# Patient Record
Sex: Male | Born: 1950 | Race: White | Hispanic: No | Marital: Married | Smoking: Never smoker
Health system: Southern US, Community
[De-identification: ages and names within clinical notes are randomized; demographics above are authoritative.]

## PROBLEM LIST (undated history)

## (undated) ENCOUNTER — Inpatient Hospital Stay (HOSPITAL_COMMUNITY): Payer: Commercial Managed Care - PPO

## (undated) DIAGNOSIS — R972 Elevated prostate specific antigen [PSA]: Secondary | ICD-10-CM

## (undated) DIAGNOSIS — E785 Hyperlipidemia, unspecified: Secondary | ICD-10-CM

## (undated) DIAGNOSIS — G4730 Sleep apnea, unspecified: Secondary | ICD-10-CM

## (undated) DIAGNOSIS — I517 Cardiomegaly: Secondary | ICD-10-CM

## (undated) HISTORY — DX: Cardiomegaly: I51.7

## (undated) HISTORY — DX: Hyperlipidemia, unspecified: E78.5

## (undated) HISTORY — DX: Sleep apnea, unspecified: G47.30

## (undated) HISTORY — DX: Elevated prostate specific antigen (PSA): R97.20

## (undated) HISTORY — PX: MENISCUS REPAIR: SHX5179

---

## 1999-03-13 ENCOUNTER — Encounter: Payer: Self-pay | Admitting: Gastroenterology

## 1999-03-13 ENCOUNTER — Encounter: Admission: RE | Admit: 1999-03-13 | Discharge: 1999-03-13 | Payer: Self-pay | Admitting: Gastroenterology

## 1999-04-07 ENCOUNTER — Encounter: Admission: RE | Admit: 1999-04-07 | Discharge: 1999-04-07 | Payer: Self-pay | Admitting: Gastroenterology

## 1999-04-07 ENCOUNTER — Encounter: Payer: Self-pay | Admitting: Gastroenterology

## 2002-08-01 ENCOUNTER — Ambulatory Visit (HOSPITAL_BASED_OUTPATIENT_CLINIC_OR_DEPARTMENT_OTHER): Admission: RE | Admit: 2002-08-01 | Discharge: 2002-08-01 | Payer: Self-pay | Admitting: Orthopedic Surgery

## 2003-03-22 ENCOUNTER — Ambulatory Visit (HOSPITAL_COMMUNITY): Admission: RE | Admit: 2003-03-22 | Discharge: 2003-03-22 | Payer: Self-pay | Admitting: Gastroenterology

## 2005-12-15 ENCOUNTER — Ambulatory Visit: Payer: Self-pay

## 2012-11-30 ENCOUNTER — Ambulatory Visit (INDEPENDENT_AMBULATORY_CARE_PROVIDER_SITE_OTHER): Payer: PRIVATE HEALTH INSURANCE | Admitting: Cardiovascular Disease

## 2012-11-30 ENCOUNTER — Encounter: Payer: Self-pay | Admitting: Cardiovascular Disease

## 2012-11-30 VITALS — BP 120/86 | HR 66 | Ht 73.0 in | Wt 194.0 lb

## 2012-11-30 DIAGNOSIS — R002 Palpitations: Secondary | ICD-10-CM

## 2012-11-30 HISTORY — DX: Palpitations: R00.2

## 2012-11-30 NOTE — Patient Instructions (Addendum)
Your physician has requested that you have an exercise tolerance test. For further information please visit [email or web address]. Please also follow instruction sheet, as given.  Your physician recommends that you schedule a follow-up appointment as needed.   

## 2012-11-30 NOTE — Progress Notes (Signed)
HPI:  This is a healthy 62 year old gentleman presenting for evaluation of heart palpitations. He has experienced palpitations on and off for quite some time. However, a few weeks ago he had worsening symptoms. These occurred at rest. His pulse felt irregular with premature beats noted. He also felt "skipped beats" in his chest. He had no associated chest pain or pressure, dyspnea, or lightheadedness. He is physically active. The patient exercises every day. He runs, rides bikes, plays tennis, etc. He has no symptoms with exertion. He's never been hospitalized. He has no past history of any surgeries. He takes no medications except for fish oil and multivitamin. He feels well. He's concerned about heart disease because of his family history. He reports no changes in sleep pattern, caffeine intake, or alcohol intake. He drinks a few glasses of wine at night. He does have a lot of stress.  Outpatient Encounter Prescriptions as of 11/30/2012  Medication Sig Dispense Refill  . Multiple Vitamins-Minerals (MULTIVITAMIN WITH MINERALS) tablet Take 1 tablet by mouth daily.      . Omega-3 Fatty Acids (OMEGA-3 FISH OIL PO) Take by mouth as needed.      . [DISCONTINUED] fish oil-omega-3 fatty acids 1000 MG capsule Take 2 g by mouth daily.       No facility-administered encounter medications on file as of 11/30/2012.    Review of patient's allergies indicates no known allergies.  Past Medical History  Diagnosis Date  . Increased prostate specific antigen (PSA) velocity   . Hyperlipidemia   . Sleep apnea     History reviewed. No pertinent past surgical history.  History   Social History  . Marital Status: Married    Spouse Name: N/A    Number of Children: N/A  . Years of Education: N/A   Occupational History  . Not on file.   Social History Main Topics  . Smoking status: Never Smoker   . Smokeless tobacco: Not on file  . Alcohol Use: Yes  . Drug Use: No  . Sexually Active: Not on file    Other Topics Concern  . Not on file   Social History Narrative   Married with 2 sons, nonsmoker, social ETOH, works as Psychologist, educational in Geologist, engineering. Frequent exercise.   Family history: negative for premature CAD. Mother died suddenly at 12, father died of cancer.  ROS: General: no fevers/chills/night sweats Eyes: no blurry vision, diplopia, or amaurosis ENT: no sore throat or hearing loss Resp: no cough, wheezing, or hemoptysis CV: no edema or palpitations GI: no abdominal pain, nausea, vomiting, diarrhea, or constipation GU: no dysuria, frequency, or hematuria Skin: no rash Neuro: no headache, numbness, tingling, or weakness of extremities Musculoskeletal: no joint pain or swelling Heme: no bleeding, DVT, or easy bruising Endo: no polydipsia or polyuria  BP 120/86  Pulse 66  Ht 6\' 1"  (1.854 m)  Wt 87.998 kg (194 lb)  BMI 25.6 kg/m2  PHYSICAL EXAM: Pt is alert and oriented, WD, WN, in no distress. HEENT: normal Neck: JVP normal. Carotid upstrokes normal without bruits. No thyromegaly. Lungs: equal expansion, clear bilaterally CV: Apex is discrete and nondisplaced, bradycardic and regular without murmur or gallop Abd: soft, NT, +BS, no bruit, no hepatosplenomegaly Back: no CVA tenderness Ext: no C/C/E        DP/PT pulses intact and = Skin: warm and dry without rash Neuro: CNII-XII intact             Strength intact = bilaterally  EKG:  11/28/2012: sinus  brady 46 bpm, otherwise WNL  ASSESSMENT AND PLAN: Heart palpitations. Absence of other associated symptoms noted. I don't see any 'red flags' on EKG or based on symptoms. No evidence of structural heart disease with normal exam. Discussed role of good sleep, caffeine and alcohol avoidance, and stress as these things can all affect palpitations. Would not recommend prn or scheduled medication at this time. He will call if symptoms worsen.  Pt most concerned about CAD risk based on family hx in aunts/uncles. Recommend  exercise treadmill study. Follow-up as needed.  Merek Niu 12/02/2012 6:39 AM

## 2012-12-02 ENCOUNTER — Encounter: Payer: Self-pay | Admitting: Cardiovascular Disease

## 2012-12-21 ENCOUNTER — Encounter: Payer: PRIVATE HEALTH INSURANCE | Admitting: Nurse Practitioner

## 2013-01-12 ENCOUNTER — Ambulatory Visit (INDEPENDENT_AMBULATORY_CARE_PROVIDER_SITE_OTHER): Payer: PRIVATE HEALTH INSURANCE | Admitting: Physician Assistant

## 2013-01-12 DIAGNOSIS — R002 Palpitations: Secondary | ICD-10-CM

## 2013-01-12 DIAGNOSIS — Z8249 Family history of ischemic heart disease and other diseases of the circulatory system: Secondary | ICD-10-CM

## 2013-01-12 DIAGNOSIS — R001 Bradycardia, unspecified: Secondary | ICD-10-CM

## 2013-01-12 DIAGNOSIS — I472 Ventricular tachycardia: Secondary | ICD-10-CM

## 2013-01-12 NOTE — Progress Notes (Signed)
Exercise Treadmill Test  Pre-Exercise Testing Evaluation Rhythm: sinus bradycardia  Rate: 57     Test  Exercise Tolerance Test Ordering MD: Tonny Bollman, MD  Interpreting MD: Tereso Newcomer, PA-C  Unique Test No: 1  Treadmill:  1  Indication for ETT: Palpitations, Family history of cardiovascular disease, Bradycardia  Contraindication to ETT: No   Stress Modality: exercise - treadmill  Cardiac Imaging Performed: non   Protocol: standard Bruce - maximal  Max BP:  206/98  Max MPHR (bpm):  160 85% MPR (bpm):  136  MPHR obtained (bpm):  157 % MPHR obtained:  98  Reached 85% MPHR (min:sec):  7:28 Total Exercise Time (min-sec):  11:42  Workload in METS:  13.4 Borg Scale: 15  Reason ETT Terminated:  desired heart rate attained    ST Segment Analysis At Rest: normal ST segments - no evidence of significant ST depression With Exercise: ST depression that resolves quickly in recovery  Other Information Arrhythmia:  Yes Angina during ETT:  absent (0) Quality of ETT:  indeterminate  ETT Interpretation:  borderline (indeterminate) with non-specific ST changes  Comments: Good exercise tolerance. No chest pain. Normal BP response to exercise. Occasional PVC/PACs; one Ventricular Triplet There was 1-2 mm ST depression in leads 2 and 3 at maximal stress.  This resolved 30 seconds into recovery.  Specificity of this finding is low. With good exercise tolerance and no chest pain, this is a low risk ETT. Discussed with the patient. No further stress testing or ischemic evaluation is indicated at this time.   Recommendations: Patient is concerned about palpitations. He was asymptomatic with the PVCs and ventricular triplet today. Given the findings on his ECGs and PVCs/ventricular couplet, will arrange a 21 day event monitor and follow up with Dr. Tonny Bollman. Signed,  Tereso Newcomer, PA-C   01/12/2013 6:03 PM

## 2013-01-12 NOTE — Patient Instructions (Addendum)
EVENT MONITOR DX NSVT, PALPITATIONS, BRADYCARDIA, PVC'S

## 2013-02-10 ENCOUNTER — Encounter (INDEPENDENT_AMBULATORY_CARE_PROVIDER_SITE_OTHER): Payer: PRIVATE HEALTH INSURANCE

## 2013-02-10 ENCOUNTER — Encounter: Payer: Self-pay | Admitting: Radiology

## 2013-02-10 DIAGNOSIS — R001 Bradycardia, unspecified: Secondary | ICD-10-CM

## 2013-02-10 DIAGNOSIS — R002 Palpitations: Secondary | ICD-10-CM

## 2013-02-10 DIAGNOSIS — I472 Ventricular tachycardia: Secondary | ICD-10-CM

## 2013-02-10 NOTE — Progress Notes (Signed)
Patient ID: Steven Gray, male   DOB: 11/27/1950, 62 y.o.   MRN: [id number] [employer] 30 day monitor applied

## 2013-03-13 ENCOUNTER — Telehealth: Payer: Self-pay | Admitting: Cardiovascular Disease

## 2013-03-13 NOTE — Telephone Encounter (Signed)
New Problem  Pt called requests confirmation as to when he should bring his monitor back.. Please assist

## 2013-03-13 NOTE — Telephone Encounter (Signed)
Pt has monitor questions.  I will forward this message to Andee Lineman to contact the pt about heart monitor.

## 2013-03-28 NOTE — Telephone Encounter (Signed)
New problem   Pt called to confirm that his Monitor was returned to [employer]// he states that he has received a bill in the mail indicating that the monitor was not returned.Marland Kitchen He says he brought it to [facility] to mail back to [employer].  Pt states no one has called him back and he does not know where the device is after he returned it to this office.Marland Kitchen He states he has not received his reading from wearing the monitor either// requests a call back to discuss.

## 2013-03-28 NOTE — Telephone Encounter (Signed)
Katie contacted [employer] and said that they have already made a notation in there system that the pt's monitor has been mailed from our office.  At this time [employer] has not received the monitor.  Monitor has been reviewed by Tereso Newcomer PA-C and is awaiting review by Dr Excell Seltzer.

## 2013-03-29 NOTE — Telephone Encounter (Signed)
I spoke with the pt and made him aware of monitor results. I also made him aware that Florentina Addison has contacted [employer] and made them aware that the pt's monitor was being mailed from our office.

## 2013-04-11 ENCOUNTER — Telehealth: Payer: Self-pay | Admitting: Cardiovascular Disease

## 2013-04-11 MED ORDER — METOPROLOL TARTRATE 25 MG PO TABS: 12.5000 mg | ORAL_TABLET | Freq: Two times a day (BID) | ORAL | Status: DC

## 2013-04-11 NOTE — Telephone Encounter (Signed)
I spoke with the pt and he is concerned because of increased palpitations over the last few days.  The pt had been free of palpitations for the past month and over the last few days he has had palpitations almost constant.  The pt would like to know what Dr Excell Seltzer would recommend at this time to help his symptoms.

## 2013-04-11 NOTE — Telephone Encounter (Signed)
Rx sent to pharmacy. I spoke with the pt and made him aware of medication instructions.  The pt now seems very reluctant about even wanting to take a medication for palpitations.  I made the pt aware that he has the option whether or not he wants to take the medication.

## 2013-04-11 NOTE — Telephone Encounter (Signed)
New Problem:  Pt is requesting a call back from the nurse. Pt states he will give more details when the nurse calls.

## 2013-04-11 NOTE — Telephone Encounter (Signed)
Would add metoprolol 12.5 mg BID

## 2013-04-11 NOTE — Telephone Encounter (Signed)
Left message on machine for pt to contact the office.   

## 2013-08-25 ENCOUNTER — Ambulatory Visit: Payer: PRIVATE HEALTH INSURANCE | Admitting: Cardiovascular Disease

## 2015-09-26 ENCOUNTER — Ambulatory Visit (INDEPENDENT_AMBULATORY_CARE_PROVIDER_SITE_OTHER): Payer: Commercial Managed Care - PPO | Admitting: Family Medicine

## 2015-09-26 ENCOUNTER — Encounter: Payer: Self-pay | Admitting: Family Medicine

## 2015-09-26 VITALS — BP 128/80 | Ht 73.0 in | Wt 192.0 lb

## 2015-09-26 DIAGNOSIS — M9262 Juvenile osteochondrosis of tarsus, left ankle: Secondary | ICD-10-CM | POA: Diagnosis not present

## 2015-09-26 HISTORY — DX: Juvenile osteochondrosis of tarsus, left ankle: M92.62

## 2015-09-26 MED ORDER — MELOXICAM 15 MG PO TABS: 15.0000 mg | ORAL_TABLET | Freq: Every day | ORAL | Status: DC

## 2015-09-26 MED ORDER — NITROGLYCERIN 0.2 MG/HR TD PT24: MEDICATED_PATCH | TRANSDERMAL | Status: DC

## 2015-09-26 NOTE — Patient Instructions (Signed)

## 2015-09-26 NOTE — Assessment & Plan Note (Signed)
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 -Start with heel lift, icing, topical NSAIDs as well as nitroglycerin patch, Mobic daily and concentric/eccentric exercises. -Follow-up in 6 weeks. If this becomes bothersome enough he can always consider surgical management with a exotosis removal.

## 2015-09-26 NOTE — Progress Notes (Signed)
  [name] - 65 y.o. male MRN [id number]  Date of birth: February 17, 1951 Steven Gray is a 65 y.o. male who presents today for left heel pain.  Left heel pain, initial visit 09/26/15-patient presents today with 6 weeks of posterior heel pain. He has noticed a bump back there before but this increases with running. He has tried icing and stretching with occasional Aleve with minimal relief. Denies any acute injury. No paresthesias going into his feet.  PMHx - Updated and reviewed.  Contributory factors include: Hyperlipidemia PSHx - Updated and reviewed.  Contributory factors include:  Negative FHx - Updated and reviewed.  Contributory factors include:  Negative Social Hx - Updated and reviewed. Contributory factors include: Nonsmoker  Medications - reviewed   ROS Per HPI.  12 point negative other than per HPI.   Exam:  Filed Vitals:   09/26/15 1036  BP: 128/80   Gen: NAD, AAO 3 Cardio- RRR Pulm - Normal respiratory effort/rate Skin: No rashes or erythema Extremities: No edema  Vascular: pulses +2 bilateral upper and lower extremity Psych: Normal affect  MSK L leg - Haglund's deformity present at posterior superior calcaneus.  Tenderness palpation in this region as well. He does have an intact Thompson test. Range of motion is normal at the ankle. Neurovascular intact left lower extremity.

## 2015-12-03 ENCOUNTER — Other Ambulatory Visit: Payer: Self-pay | Admitting: *Deleted

## 2015-12-03 DIAGNOSIS — M9262 Juvenile osteochondrosis of tarsus, left ankle: Secondary | ICD-10-CM

## 2015-12-03 MED ORDER — NITROGLYCERIN 0.2 MG/HR TD PT24
MEDICATED_PATCH | TRANSDERMAL | 1 refills | Status: DC
Start: 2015-12-03 — End: 2016-03-16

## 2015-12-04 ENCOUNTER — Other Ambulatory Visit: Payer: Self-pay | Admitting: Family Medicine

## 2015-12-04 DIAGNOSIS — M9262 Juvenile osteochondrosis of tarsus, left ankle: Secondary | ICD-10-CM

## 2016-02-15 ENCOUNTER — Other Ambulatory Visit: Payer: Self-pay | Admitting: Family Medicine

## 2016-02-15 DIAGNOSIS — M9262 Juvenile osteochondrosis of tarsus, left ankle: Secondary | ICD-10-CM

## 2016-03-16 ENCOUNTER — Other Ambulatory Visit: Payer: Self-pay | Admitting: *Deleted

## 2016-03-16 DIAGNOSIS — M9262 Juvenile osteochondrosis of tarsus, left ankle: Secondary | ICD-10-CM

## 2016-03-16 MED ORDER — MELOXICAM 15 MG PO TABS: 15.0000 mg | ORAL_TABLET | Freq: Every day | ORAL | 2 refills | Status: DC

## 2016-03-16 MED ORDER — NITROGLYCERIN 0.2 MG/HR TD PT24: MEDICATED_PATCH | TRANSDERMAL | 2 refills | Status: DC

## 2016-11-26 ENCOUNTER — Ambulatory Visit: Payer: Commercial Managed Care - PPO | Admitting: Sports Medicine

## 2016-12-07 ENCOUNTER — Ambulatory Visit (INDEPENDENT_AMBULATORY_CARE_PROVIDER_SITE_OTHER): Payer: Commercial Managed Care - PPO | Admitting: Sports Medicine

## 2016-12-07 ENCOUNTER — Ambulatory Visit: Payer: Commercial Managed Care - PPO | Admitting: Sports Medicine

## 2016-12-07 DIAGNOSIS — M25561 Pain in right knee: Secondary | ICD-10-CM | POA: Insufficient documentation

## 2016-12-07 DIAGNOSIS — M9262 Juvenile osteochondrosis of tarsus, left ankle: Secondary | ICD-10-CM

## 2016-12-07 HISTORY — DX: Pain in right knee: M25.561

## 2016-12-07 NOTE — Assessment & Plan Note (Addendum)
Findings consistent with patellar tendonitis.   -Recommend heel lifts  -activity modification including bikin/swimming, can continue icing and Aleve

## 2016-12-07 NOTE — Assessment & Plan Note (Addendum)
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   Has been taking Aleve, icing, and doing concentric/eccentric exercises.  -Recommend heel grips, continue NSAIDs and exercises at home.   -Has failed conservative therapy. Have discussed further options with patient including surgery. Will refer to Orthopedics for their opinion.

## 2016-12-07 NOTE — Progress Notes (Signed)
   Subjective:    Patient ID: Steven Gray, male    DOB: [birth year], 66 y.o.   MRN: [id number]  HPI  Haglund's deformity of left heel Patient reports chronic heel pain which has been especially bothersome over the past 1 month.  Pain is worse with exercise especially after playing tennis and running.   Was previously prescribed Nitroglycerin patches which helped somewhat but he did not continue as they gave him a headache.  Was also given orthotics which he is currently using, and Mobic which he does not take as Aleve seems to work better for him.  Has been icing the foot and using a heel pad to cushion the heel against his shoes. He has been doing concentric/eccentric exercises at home.  Overall he feels his pain is better this month.     Right knee pain Patient reports pain has been ongoing for several years however with progressive worsening over last few weeks.  Bothers him most after running however does not keep him from activity.  He notes he runs a half marathon every winter after which it hurts him.  Has not noticed any swelling over the joint.    Review of Systems  Constitutional: Negative for fever.  Cardiovascular: Negative for leg swelling.  Musculoskeletal: Negative for gait problem.  Skin: Negative for color change.     Objective:   Physical Exam BP 122/78   Ht 6\' 1"  (1.854 m)   Wt 190 lb (86.2 kg)   BMI 25.07 kg/m   General:  66 yo male, NAD  Ankle - Left No visible erythema, Haglund's deformity noted over left Achilles with mild tenderness to palpation  Range of motion is full in all directions. Strength is 5/5 in all directions. Gait is normal.   Knee - Right  Normal to inspection with no erythema or effusion or obvious bony abnormalities.  Palpation normal with no warmth or joint line tenderness or patellar tenderness or condyle tenderness. ROM normal in flexion and extension and lower leg rotation. Negative Mcmurray's and provocative meniscal tests. Non  painful patellar compression.    Assessment & Plan:   Patient seen and evaluated with the resident. I agree with the plan of care. Patient has failed conservative treatment for insertional Achilles tendinopathy/Haglund's deformity. He would like to meet with orthopedics to discuss surgical options. In the meantime, we have given him new 5/16 inch heel lifts and I gave him a heel grip to wear in his left shoe.

## 2016-12-09 ENCOUNTER — Other Ambulatory Visit: Payer: Self-pay

## 2016-12-09 DIAGNOSIS — M9262 Juvenile osteochondrosis of tarsus, left ankle: Secondary | ICD-10-CM

## 2017-10-11 ENCOUNTER — Ambulatory Visit: Payer: Commercial Managed Care - PPO | Admitting: Sports Medicine

## 2017-10-11 VITALS — BP 137/98 | Ht 73.0 in | Wt 189.0 lb

## 2017-10-11 DIAGNOSIS — M25561 Pain in right knee: Secondary | ICD-10-CM
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
## 2017-10-13 ENCOUNTER — Other Ambulatory Visit: Payer: Self-pay

## 2017-10-13 DIAGNOSIS — M9262 Juvenile osteochondrosis of tarsus, left ankle: Secondary | ICD-10-CM

## 2017-10-13 MED ORDER — MELOXICAM 15 MG PO TABS: ORAL_TABLET | ORAL | 0 refills | Status: DC

## 2018-07-07 ENCOUNTER — Ambulatory Visit: Payer: Commercial Managed Care - PPO | Admitting: Sports Medicine

## 2018-07-07 ENCOUNTER — Encounter: Payer: Self-pay | Admitting: Sports Medicine

## 2018-07-07 VITALS — BP 130/82 | Ht 73.0 in | Wt 190.0 lb

## 2018-07-07 DIAGNOSIS — M76821 Posterior tibial tendinitis, right leg: Secondary | ICD-10-CM

## 2018-07-07 MED ORDER — DICLOFENAC SODIUM 1 % TD GEL: 2.0000 g | Freq: Three times a day (TID) | TRANSDERMAL | 1 refills | Status: DC | PRN

## 2018-07-07 NOTE — Progress Notes (Signed)
  Steven Gray - 68 y.o. male MRN [id number]  Date of birth: [birth year]   Chief Complaint: Right foot pain  HPI:  68 year old male who presents for right foot pain.  The pain has been present since January.  Around this time he started training for a half marathon, which he is participating in on 07/09/2018.  He states he for started feeling pain while running.  He also continued to play frequent hard court tennis around this time.  He describes the pain as a dull ache that intermittently becomes sharp located inferior to his medial ankle, posterior to the medial malleolus.  He has tried Aleve and icing the pain which has helped a little bit.  He states that he does not typically have a lot of pain while running, is usually the next day that is very painful.  He is going to precipitate in the half marathon in 2 days, he is most interested in things that will help him get through the race.  States that he will take it a lot easier once the race is over with.  He has been running 10 miles once per week.  He has been using old shoes while running.  That they are very comfortable, but that the sole soles are "a little run down".  Of note the patient was seen last year for Haglund's deformity left heel.  He states that this pain is not consistent with that.  ROS:     See HPI  PERTINENT  PMH / PSH FH / / SH:  Past Medical, Surgical, Social, and Family History Reviewed & Updated in the EMR.  Pertinent findings include:  History of Haglund's deformity of left heel  OBJECTIVE: BP 130/82   Ht 6\' 1"  (1.854 m)   Wt 190 lb (86.2 kg)   BMI 25.07 kg/m   Physical Exam:  Vital signs are reviewed.  GEN: Alert and oriented, NAD Pulm: Breathing unlabored PSY: normal mood, congruent affect  MSK: Right foot Inspection: Notable nodular deformity superior to Achilles tendon insertion site, symmetric with left foot. Palpation: mild TTP posterior to themedial malleolus. No TTP specifically over the achilles  tendon. No bony TTP Range of motion: Full range of motion to flexion, extension, inversion, eversion, bilateral medial rotation intact. Without pain Vascular: Palpable PT/DP.  Skin warm and dry, cap refill less than 2 seconds Neuro: Sensation intact entire distribution of foot.  5 out of 5 strength plantar flexion, dorsiflexion Special tests: No asymmetry on talar tilt test, anterior drawer test  Left foot: hanglunds deformity noted No focal TTP Full ROM with 5/5 strength  ASSESSMENT & PLAN:  1.  Right foot pain Likely secondary to posterior tibialis tendinopathy given history and exam findings.  Gave patient hapad  inserts to allow for heel support.  Also try Voltaren gel to apply topically.    Patient also to ice the area thoroughly before and after running. -Bilateral shoe insert was given -Voltaren gel topically 2-4 times per day for tenderness -Ice 15 minutes 2 times per day up until race, ice afterwards -Follow-up as needed     I saw and evaluated the patient with the resident.  I agree with the evaluation and treatment plan as discussed and edited the above note as necessary. Dalbert Garnet, DO Sports Medicine   I was the preceptor for this visit and available for immediate consultation to both the resident and the sports medicine fellow Steven Aris, DO

## 2018-08-19 ENCOUNTER — Ambulatory Visit: Payer: Commercial Managed Care - PPO | Admitting: Podiatry

## 2018-08-19 ENCOUNTER — Ambulatory Visit (INDEPENDENT_AMBULATORY_CARE_PROVIDER_SITE_OTHER): Payer: Commercial Managed Care - PPO

## 2018-08-19 ENCOUNTER — Encounter: Payer: Self-pay | Admitting: Podiatry

## 2018-08-19 ENCOUNTER — Other Ambulatory Visit: Payer: Self-pay

## 2018-08-19 VITALS — Temp 97.7°F

## 2018-08-19 DIAGNOSIS — M216X9 Other acquired deformities of unspecified foot: Secondary | ICD-10-CM

## 2018-08-19 DIAGNOSIS — M76829 Posterior tibial tendinitis, unspecified leg: Secondary | ICD-10-CM

## 2018-08-19 DIAGNOSIS — M722 Plantar fascial fibromatosis: Secondary | ICD-10-CM | POA: Diagnosis not present

## 2018-08-19 DIAGNOSIS — M7752 Other enthesopathy of left foot: Secondary | ICD-10-CM | POA: Diagnosis not present

## 2018-08-19 MED ORDER — MELOXICAM 15 MG PO TABS: 15.0000 mg | ORAL_TABLET | Freq: Every day | ORAL | 0 refills | Status: DC

## 2018-08-19 NOTE — Patient Instructions (Signed)
Posterior Tibial Tendon Tear Rehab Ask your health care provider which exercises are safe for you. Do exercises exactly as told by your health care provider and adjust them as directed. It is normal to feel mild stretching, pulling, tightness, or discomfort as you do these exercises, but you should stop right away if you feel sudden pain or your pain gets worse.Do not begin these exercises until told by your health care provider. Stretching and range of motion exercises These exercises warm up your muscles and joints and improve the movement and flexibility of your ankle. These exercises also help to relieve pain, numbness, and tingling. Exercise A: Gastroc and soleus stretch  1. Sit on the floor with your left / right leg extended. 2. Loop a belt or towel around ball of your left / right foot. The ball of your foot is on the walking surface, right under your toes. 3. Keep your left / right ankle and foot relaxed and keep your knee straight while you use the belt or towel to pull your foot and ankle toward you. You should feel a gentle stretch behind your calf or knee. 4. Hold this position for __________ seconds. Repeat __________ times. Complete this exercise __________ times a day. Exercise B: Dorsiflexion/plantar flexion  1. Sit with your left / right knee straight or bent. 2. Flex your left / right ankle to tilt the top of your foot toward your shin. 3. Hold this position for __________ seconds. 4. Point your toes downward to tilt the top of your foot away from your shin. 5. Hold this position for __________ seconds. Repeat __________ times with your knee straight and __________ times with your knee bent. Complete this exercise __________ times a day. Exercise C: Ankle plantar flexion, passive  1. Sit with your left / right leg crossed over your opposite knee. 2. Use your opposite hand to pull the top of your foot and toes toward you. You should feel a gentle stretch on the top of your  foot and ankle. 3. Hold this position for __________ seconds. Repeat __________ times. Complete this exercise __________ times a day. Exercise D: Ankle eversion  1. Sit with your left / right ankle crossed over your opposite knee. 2. Grip your left / right foot with your opposite hand, with your thumb on the top of your foot and with your fingers on the bottom of your foot. 3. Gently push your foot downward with a slight rotation so the smallest toes rise slightly toward the ceiling. You should feel a gentle stretch on the inside of your ankle. 4. Hold this stretch for __________ seconds. Repeat __________ times. Complete this exercise __________ times a day. Exercise E: Ankle inversion  1. Sit with your left / right ankle crossed over your opposite knee. 2. Hold your left / right foot with your opposite hand, with your thumb on the bottom of your foot and your fingers on the top of your foot. 3. Gently pull your foot. Your smallest toe should come toward you, and your thumb should be pushing against the ball of your foot. You should feel a gentle stretch on the outside of your ankle. 4. Hold the stretch for __________ seconds. Repeat __________ times. Complete this exercise __________ times a day. Exercise F: Ankle alphabet  1. Sit with your left / right leg supported at the lower leg. ? Do not rest your foot on anything. ? Make sure your foot has room to move freely. 2. Think of your   left / right foot as a paintbrush, and move your foot to trace each letter of the alphabet in the air. Keep your hip and knee still while you trace. 3. Trace every letter from A to Z. Repeat __________ times. Complete this exercise __________ times a day. Strengthening exercises These exercises build strength and endurance in your lower leg. Endurance is the ability to use your muscles for a long time, even after they get tired. Exercise G: Dorsiflexors  1. Secure a rubber exercise band or tube to an  object that will not move if it is pulled on, such as a table leg. 2. Secure the other end of the band around your left / right foot. 3. Sit on the floor, facing the object with your left / right leg extended. The band or tube should be slightly tense when your foot is relaxed. 4. Slowly flex your left / right ankle and toes to bring your foot toward you. 5. Hold this position for __________ seconds. 6. Let the band or tube slowly pull your foot back to the starting position. Repeat __________ times. Complete this exercise __________ times a day. Exercise H: Plantar flexors  1. Sit on the floor with your left / right leg extended. 2. Loop a rubber exercise band or tube around the ball of your __________ foot. The ball of your foot is on the walking surface, right under your toes. The band or tube should be slightly tense when your foot is relaxed. 3. Slowly point your toes downward, pushing them away from you. 4. Hold this position for __________ seconds. 5. Let the band or tube slowly pull your foot back to the starting position. Repeat __________ times. Complete this exercise __________ times a day. Exercise I: Towel curls  1. Sit in a chair on a non-carpeted surface, and put your feet on the floor. 2. Place a towel in front of your feet. If told by your health care provider, add __________ to the end of the towel. 3. Keeping your heel on the floor, put your left / right foot on the towel. 4. Pull the towel toward you by grabbing the towel with your toes and curling them under. Keep your heel on the floor. Repeat __________ times. Complete this exercise __________ times a day. This information is not intended to replace advice given to you by your health care provider. Make sure you discuss any questions you have with your health care provider. Document Released: 04/20/2005 Document Revised: 12/26/2015 Document Reviewed: 04/14/2015 Elsevier Interactive Patient Education  2019 [employer]   

## 2018-08-23 ENCOUNTER — Ambulatory Visit: Payer: Commercial Managed Care - PPO | Admitting: Sports Medicine

## 2018-08-23 NOTE — Progress Notes (Signed)
Subjective:   Patient ID: Steven Gray, male   DOB: 68 y.o.   MRN: [id number]   HPI 68 year old male presents the office with concerns of right ankle pain, mostly to the medial aspect.  He states this started back in January and he thinks this may been from sports injury complaint tennis on hard courts.  He previously did see sports medicine.  He is continued to have pain.  He does not have pain with the actual activity but he states the next day after being active he gets pain to the ankle.  He does wear inserts inside of his shoes.  He was previously training for half marathon and doing a running as well.  He states that he has discomfort while doing this.  Continue to run.  Denies any specific injury the time of onset of symptoms that he can recall.  Minimal swelling.  No numbness or tingling.   Review of Systems  All other systems reviewed and are negative.  Past Medical History:  Diagnosis Date  . Hyperlipidemia   . Increased prostate specific antigen (PSA) velocity   . Sleep apnea     No past surgical history on file.   Current Outpatient Medications:  .  diclofenac sodium (VOLTAREN) 1 % GEL, Apply 2 g topically 3 (three) times daily as needed., Disp: 100 g, Rfl: 1 .  meloxicam (MOBIC) 15 MG tablet, Take 1 tablet (15 mg total) by mouth daily., Disp: 14 tablet, Rfl: 0 .  nitroGLYCERIN (NITRODUR - DOSED IN MG/24 HR) 0.2 mg/hr patch, Place 1/4 patch to affected area daily, Disp: 30 patch, Rfl: 2  No Known Allergies       Objective:  Physical Exam  General: AAO x3, NAD  Dermatological: Skin is warm, dry and supple bilateral. Nails x 10 are well manicured; remaining integument appears unremarkable at this time. There are no open sores, no preulcerative lesions, no rash or signs of infection present.  Vascular: Dorsalis Pedis artery and Posterior Tibial artery pedal pulses are 2/4 bilateral with immedate capillary fill time. Pedal hair growth present. No varicosities and no  lower extremity edema present bilateral. There is no pain with calf compression, swelling, warmth, erythema.   Neruologic: Grossly intact via light touch bilateral. Vibratory intact via tuning fork bilateral. Protective threshold with Semmes Wienstein monofilament intact to all pedal sites bilateral. Patellar and Achilles deep tendon reflexes 2+ bilateral. No Babinski or clonus noted bilateral.   Musculoskeletal: Cavus foot type is present.  There is no area of pinpoint tenderness identified today.  Ankle, subtalar range of motion intact.  The area of tenderness that he is experiencing is along the course of the flexor, posterior tibial tendon.  The tenderness is posterior to the medial malleolus.  There is no erythema clinically significant articular sensation.  Posterior calcaneal spurring is present without any tenderness today.  Muscular strength 5/5 in all groups tested bilateral.  Gait: Unassisted, Nonantalgic.       Assessment:   Posterior tibial tendinitis     Plan:  -Treatment options discussed including all alternatives, risks, and complications -Etiology of symptoms were discussed -X-rays were obtained and reviewed with the patient.  No evidence of acute fracture or stress fracture identified today. -Ongoing discussion about surgical treatment options for short-term or immobilized in a Tri-Lock ankle brace was dispensed today.  Also recommended anti-inflammatories use as needed.  Restart Loxitane discussed side effects medication.  Also start some rehabilitation exercises posterior tibial tendon.  Also things with  benefit more from a more custom orthotic given his arch type.  We discussed a steroid injection but he is having no pain today given the area of tenderness we will consider doing this but we held off today. -We will have him follow-up with [facility] Noble for inserts he also wants this discussed shoes.  Vivi Barrack DPM

## 2018-08-25 ENCOUNTER — Other Ambulatory Visit: Payer: Self-pay

## 2018-08-25 ENCOUNTER — Ambulatory Visit: Payer: Commercial Managed Care - PPO | Admitting: Orthotics

## 2018-08-25 DIAGNOSIS — M216X9 Other acquired deformities of unspecified foot: Secondary | ICD-10-CM

## 2018-08-25 DIAGNOSIS — M7752 Other enthesopathy of left foot: Secondary | ICD-10-CM

## 2018-08-25 DIAGNOSIS — M7751 Other enthesopathy of right foot: Secondary | ICD-10-CM | POA: Diagnosis not present

## 2018-08-25 DIAGNOSIS — M76829 Posterior tibial tendinitis, unspecified leg: Secondary | ICD-10-CM

## 2018-08-25 NOTE — Progress Notes (Signed)
Patient came into today to be cast for Custom Foot Orthotics. Upon recommendation of Dr. Ardelle Anton Patient presents with ankle pain, medial and posterior Goals are off rf stability, add heel lift take pressure off RF calacaneal insertion  Plan vendor Richiey

## 2018-09-20 ENCOUNTER — Other Ambulatory Visit: Payer: Self-pay

## 2018-09-20 ENCOUNTER — Ambulatory Visit (INDEPENDENT_AMBULATORY_CARE_PROVIDER_SITE_OTHER): Payer: Commercial Managed Care - PPO | Admitting: Orthotics

## 2018-09-20 DIAGNOSIS — M7752 Other enthesopathy of left foot: Secondary | ICD-10-CM

## 2018-09-20 DIAGNOSIS — M76829 Posterior tibial tendinitis, unspecified leg: Secondary | ICD-10-CM

## 2018-09-20 DIAGNOSIS — M216X9 Other acquired deformities of unspecified foot: Secondary | ICD-10-CM

## 2018-09-20 NOTE — Progress Notes (Signed)
Patient came in today to pick up custom made foot orthotics.  The goals were accomplished and the patient reported no dissatisfaction with said orthotics.  Patient was advised of breakin period and how to report any issues. 

## 2018-10-17 ENCOUNTER — Telehealth: Payer: Self-pay | Admitting: Podiatry

## 2018-10-17 DIAGNOSIS — M7752 Other enthesopathy of left foot: Secondary | ICD-10-CM

## 2018-10-17 DIAGNOSIS — M76829 Posterior tibial tendinitis, unspecified leg: Secondary | ICD-10-CM

## 2018-10-17 DIAGNOSIS — M216X9 Other acquired deformities of unspecified foot: Secondary | ICD-10-CM

## 2018-10-17 NOTE — Telephone Encounter (Signed)
Pt called and left message for me stating he has left several messages about getting a second pair of orthotics.  Returned pts call and he is wanting a second pair just like the 1st ones made in April. He was not sure if he wanted insurance billed or not. He is aware that if we bill insurance for the 398.00 and they don't cover he would be responsible for the cost. But if we do cash pay for 199.00..he said to order and he will pay.

## 2019-02-09 ENCOUNTER — Other Ambulatory Visit: Payer: Self-pay

## 2019-02-09 DIAGNOSIS — Z20822 Contact with and (suspected) exposure to covid-19: Secondary | ICD-10-CM

## 2019-02-10 LAB — NOVEL CORONAVIRUS, NAA: SARS-CoV-2, NAA: NOT DETECTED

## 2019-04-06 ENCOUNTER — Other Ambulatory Visit (HOSPITAL_COMMUNITY): Payer: Self-pay | Admitting: Orthopaedic Surgery

## 2019-04-06 DIAGNOSIS — M79605 Pain in left leg: Secondary | ICD-10-CM

## 2019-04-06 DIAGNOSIS — M7989 Other specified soft tissue disorders: Secondary | ICD-10-CM

## 2019-04-07 ENCOUNTER — Ambulatory Visit (HOSPITAL_COMMUNITY)
Admission: RE | Admit: 2019-04-07 | Discharge: 2019-04-07 | Disposition: A | Discharge status: Home / Self Care | Payer: Commercial Managed Care - PPO | Source: Ambulatory Visit | Attending: Orthopaedic Surgery | Admitting: Orthopaedic Surgery

## 2019-04-07 ENCOUNTER — Other Ambulatory Visit: Payer: Self-pay

## 2019-04-07 DIAGNOSIS — M79605 Pain in left leg: Secondary | ICD-10-CM | POA: Diagnosis not present

## 2019-04-07 DIAGNOSIS — M7989 Other specified soft tissue disorders: Secondary | ICD-10-CM | POA: Diagnosis not present

## 2019-04-07 NOTE — Progress Notes (Signed)
Left lower extremity venous duplex has been completed. Preliminary results can be found in CV Proc through chart review.  Results were given to Dr. Griffin Basil.  04/07/19 9:14 AM Steven Gray RVT

## 2019-04-17 ENCOUNTER — Ambulatory Visit: Payer: Self-pay

## 2019-04-17 ENCOUNTER — Encounter: Payer: Self-pay | Admitting: Family Medicine

## 2019-04-17 ENCOUNTER — Other Ambulatory Visit: Payer: Self-pay

## 2019-04-17 ENCOUNTER — Ambulatory Visit: Payer: Commercial Managed Care - PPO | Admitting: Family Medicine

## 2019-04-17 VITALS — BP 126/78 | Ht 73.0 in | Wt 195.0 lb

## 2019-04-17 DIAGNOSIS — M79662 Pain in left lower leg: Secondary | ICD-10-CM

## 2019-04-17 DIAGNOSIS — S8012XA Contusion of left lower leg, initial encounter: Secondary | ICD-10-CM

## 2019-04-17 NOTE — Progress Notes (Signed)
PCP: Burnard Bunting, MD  Subjective:   HPI: Patient is a 68 y.o. male here for left lower leg injury.  Patient denies known acute injury though he has returned to playing tennis and walked his dog the day pain in left calf started about a week or so ago. Associated with swelling and bruising that has gone down leg into foot and ankle. He had knee arthroscopy 2 months ago - saw Dr. Griffin Basil and had doppler u/s negative for a dvt. Pain is in medial calf area though he did have pain as well in quad tendon area of knee. Switched from meloxicam to celebrex with minimal benefit. Worse attempting calf raise.  Past Medical History:  Diagnosis Date  . Hyperlipidemia   . Increased prostate specific antigen (PSA) velocity   . Sleep apnea     Current Outpatient Medications on File Prior to Visit  Medication Sig Dispense Refill  . aspirin 81 MG chewable tablet Chew 81 mg by mouth 2 (two) times daily.    . celecoxib (CELEBREX) 100 MG capsule Take 100 mg by mouth 2 (two) times daily.    Marland Kitchen zolpidem (AMBIEN) 10 MG tablet Take 5-10 mg by mouth at bedtime as needed.     No current facility-administered medications on file prior to visit.    History reviewed. No pertinent surgical history.  No Known Allergies  Social History   Socioeconomic History  . Marital status: Married    Spouse name: Not on file  . Number of children: Not on file  . Years of education: Not on file  . Highest education level: Not on file  Occupational History  . Not on file  Tobacco Use  . Smoking status: Never Smoker  . Smokeless tobacco: Never Used  Substance and Sexual Activity  . Alcohol use: Yes  . Drug use: No  . Sexual activity: Not on file  Other Topics Concern  . Not on file  Social History Narrative   Married with 2 sons, nonsmoker, social ETOH, works as Programme researcher, broadcasting/film/video in [employer], prison and probation services. Frequent exercise.   Social Determinants of Health   Financial Resource Strain:   . Difficulty of Paying Living  Expenses: Not on file  Food Insecurity:   . Worried About Charity fundraiser in the Last Year: Not on file  . Ran Out of Food in the Last Year: Not on file  Transportation Needs:   . Lack of Transportation (Medical): Not on file  . Lack of Transportation (Non-Medical): Not on file  Physical Activity:   . Days of Exercise per Week: Not on file  . Minutes of Exercise per Session: Not on file  Stress:   . Feeling of Stress : Not on file  Social Connections:   . Frequency of Communication with Friends and Family: Not on file  . Frequency of Social Gatherings with Friends and Family: Not on file  . Attends Religious Services: Not on file  . Active Member of Clubs or Organizations: Not on file  . Attends Archivist Meetings: Not on file  . Marital Status: Not on file  Intimate Partner Violence:   . Fear of Current or Ex-Partner: Not on file  . Emotionally Abused: Not on file  . Physically Abused: Not on file  . Sexually Abused: Not on file    History reviewed. No pertinent family history.  BP 126/78   Ht 6\' 1"  (1.854 m)   Wt 195 lb (88.5 kg)   BMI 25.73 kg/m  Review of Systems: See HPI above.     Objective:  Physical Exam:  Gen: NAD, comfortable in exam room  Left lower leg/knee: Fullness, swelling of medial calf into foot and ankle.  Bruising noted in popliteal fossa but into medial calf, foot, ankle as well. No joint line tenderness of knee or post patellar facet tenderness.  Tenderness of medial gastroc reproducing his pain without obvious defect. FROM ankle, knee.  5/5 strength all motions though unable to do calf raise 2/2 pain. Negative ant/post drawers. Negative valgus/varus testing.  Negative mcmurrays, apleys. NV intact distally.   MSK u/s left calf:  Large hematoma noted medial gastroc with some clot formation already occurring but some anechoic portions.  Roughly measures 12cm in length by 3x4cm.  No vascularity to this.  Assessment & Plan:  1.  Left medial gastroc strain with large hematoma - discussed options and given size of this hematoma and level of discomfort went ahead with aspiration of anechoic portion of this draining 63mL of sanguinous fluid.  Stressed importance of compression - he had a knee high compression sock to wear as often as possible.  Icing, heel lifts to help unload the area.  Simple motion exercises of the ankle.  Tylenol and/or aleve.  F/u in 2 weeks.  After informed written consent timeout was performed, patient was lying prone on exam table.  Left lower leg over hematoma was prepped with alcohol swab x 2.  Utilizing posterior approach, 3 mL of bupivicaine was used for local anesthesia over the hematoma.  Then using an 18g needle on 60cc syringe with ultrasound guidance,48 mL of sanguinous fluid was aspirated from hematoma of left lower leg.  Patient tolerated procedure well without immediate complications.  Images saved for documentation.

## 2019-04-17 NOTE — Patient Instructions (Addendum)
You have a calf strain with a large hematoma that was drained today. Compression sleeve or stocking as often as possible to keep swelling down. Icing for 15 minutes at a time 3-4 times a day at least Heel lifts either in temporary orthotics or on their own to prevent further strain. Crutches if needed Tylenol and/or aleve for pain. Start ankle range of motion exercises twice a day (up/down and alphabet exercises). Follow up with me in 2 weeks.

## 2019-05-01 ENCOUNTER — Encounter: Payer: Self-pay | Admitting: Family Medicine

## 2019-05-01 ENCOUNTER — Other Ambulatory Visit: Payer: Self-pay

## 2019-05-01 ENCOUNTER — Ambulatory Visit: Payer: Commercial Managed Care - PPO | Admitting: Family Medicine

## 2019-05-01 ENCOUNTER — Ambulatory Visit: Payer: Self-pay

## 2019-05-01 VITALS — BP 122/70 | Ht 73.0 in | Wt 195.0 lb

## 2019-05-01 DIAGNOSIS — M79662 Pain in left lower leg: Secondary | ICD-10-CM

## 2019-05-01 NOTE — Progress Notes (Signed)
PCP: Burnard Bunting, MD  Subjective:   HPI: Patient is a 68 y.o. male here for left lower leg injury.  12/14: Patient denies known acute injury though he has returned to playing tennis and walked his dog the day pain in left calf started about a week or so ago. Associated with swelling and bruising that has gone down leg into foot and ankle. He had knee arthroscopy 2 months ago - saw Dr. Griffin Basil and had doppler u/s negative for a dvt. Pain is in medial calf area though he did have pain as well in quad tendon area of knee. Switched from meloxicam to celebrex with minimal benefit. Worse attempting calf raise.  12/28: Patient reports he's doing much better. Swelling has improved. Still feels firm area medial calf. Has been able to swim and walk (up to 2 miles of walking yesterday) with minimal discomfort. Gets only some achiness if he overuses this.  Past Medical History:  Diagnosis Date  . Hyperlipidemia   . Increased prostate specific antigen (PSA) velocity   . Sleep apnea     Current Outpatient Medications on File Prior to Visit  Medication Sig Dispense Refill  . aspirin 81 MG chewable tablet Chew 81 mg by mouth 2 (two) times daily.    . celecoxib (CELEBREX) 100 MG capsule Take 100 mg by mouth 2 (two) times daily.    Marland Kitchen zolpidem (AMBIEN) 10 MG tablet Take 5-10 mg by mouth at bedtime as needed.     No current facility-administered medications on file prior to visit.    History reviewed. No pertinent surgical history.  No Known Allergies  Social History   Socioeconomic History  . Marital status: Married    Spouse name: Not on file  . Number of children: Not on file  . Years of education: Not on file  . Highest education level: Not on file  Occupational History  . Not on file  Tobacco Use  . Smoking status: Never Smoker  . Smokeless tobacco: Never Used  Substance and Sexual Activity  . Alcohol use: Yes  . Drug use: No  . Sexual activity: Not on file  Other  Topics Concern  . Not on file  Social History Narrative   Married with 2 sons, nonsmoker, social ETOH, works as Programme researcher, broadcasting/film/video in [employer], prison and probation services. Frequent exercise.   Social Determinants of Health   Financial Resource Strain:   . Difficulty of Paying Living Expenses: Not on file  Food Insecurity:   . Worried About Charity fundraiser in the Last Year: Not on file  . Ran Out of Food in the Last Year: Not on file  Transportation Needs:   . Lack of Transportation (Medical): Not on file  . Lack of Transportation (Non-Medical): Not on file  Physical Activity:   . Days of Exercise per Week: Not on file  . Minutes of Exercise per Session: Not on file  Stress:   . Feeling of Stress : Not on file  Social Connections:   . Frequency of Communication with Friends and Family: Not on file  . Frequency of Social Gatherings with Friends and Family: Not on file  . Attends Religious Services: Not on file  . Active Member of Clubs or Organizations: Not on file  . Attends Archivist Meetings: Not on file  . Marital Status: Not on file  Intimate Partner Violence:   . Fear of Current or Ex-Partner: Not on file  . Emotionally Abused: Not on file  . Physically Abused:  Not on file  . Sexually Abused: Not on file    History reviewed. No pertinent family history.  BP 122/70   Ht 6\' 1"  (1.854 m)   Wt 195 lb (88.5 kg)   BMI 25.73 kg/m   Review of Systems: See HPI above.     Objective:  Physical Exam:  Gen: NAD, comfortable in exam room  Left lower leg/ankle: Left calf more swollen than right - firm area superficially within proximal medial calf area.  No bruising, other deformity. FROM ankle with 5/5 strength - able to do single leg calf raise without pain. TTP minimally medial gastroc. Thompsons test negative. NV intact distally.  MSK u/s left calf: large hematoma again noted medial gastroc with additional clot formation though still with some anechoic portions.  Appears similar  in size to last visit overall.  Assessment & Plan:  1. Left medial gastroc strain with large hematoma - Patient significantly improved clinically though hematoma appears to have recollected despite aspiration at last visit.  He is much better than I would expect at this point 3 weeks into a Grade 3 strain.  Add home exercises to this.  Elevation, compression, icing.  Wait 3 weeks before starting walk: jog program.  Wait another 3-4 weeks beyond this to start hitting tennis balls - discussed return to this as well at that point and easing into matches.  Call if he has any problems otherwise f/u prn.

## 2019-05-01 NOTE — Patient Instructions (Signed)
You're doing significantly better than I would expect at this point. Continue with compression, icing as you have been. Elevation above your heart when possible. Walking, swimming for exercise. Start calf raise exercises 3 sets of 10 once a day including on a step. Wait 3 weeks before you start walk: jog program (1:1 for 10 minutes; 2 days later 1:2 for 15 minutes; 2 days later 1:3 for 20 minutes, etc). I'd wait 3-4 weeks beyond this before you start trying tennis as we discussed. Call me if you have any problems otherwise follow up as needed. Have a great new year!

## 2019-06-02 ENCOUNTER — Ambulatory Visit: Payer: Commercial Managed Care - PPO | Attending: Internal Medicine

## 2019-06-02 DIAGNOSIS — Z20822 Contact with and (suspected) exposure to covid-19: Secondary | ICD-10-CM

## 2019-06-03 ENCOUNTER — Emergency Department (HOSPITAL_COMMUNITY): Payer: Commercial Managed Care - PPO

## 2019-06-03 ENCOUNTER — Encounter (HOSPITAL_COMMUNITY): Payer: Self-pay | Admitting: *Deleted

## 2019-06-03 ENCOUNTER — Inpatient Hospital Stay (HOSPITAL_COMMUNITY)
Admission: EM | Admit: 2019-06-03 | Discharge: 2019-06-05 | DRG: 308 | Disposition: A | Discharge status: Home / Self Care | Payer: Commercial Managed Care - PPO | Attending: Family Medicine | Admitting: Family Medicine

## 2019-06-03 ENCOUNTER — Other Ambulatory Visit: Payer: Self-pay

## 2019-06-03 DIAGNOSIS — Z8249 Family history of ischemic heart disease and other diseases of the circulatory system: Secondary | ICD-10-CM

## 2019-06-03 DIAGNOSIS — N179 Acute kidney failure, unspecified: Secondary | ICD-10-CM

## 2019-06-03 DIAGNOSIS — Z79899 Other long term (current) drug therapy: Secondary | ICD-10-CM | POA: Diagnosis not present

## 2019-06-03 DIAGNOSIS — I340 Nonrheumatic mitral (valve) insufficiency: Secondary | ICD-10-CM | POA: Diagnosis not present

## 2019-06-03 DIAGNOSIS — I361 Nonrheumatic tricuspid (valve) insufficiency: Secondary | ICD-10-CM

## 2019-06-03 DIAGNOSIS — I371 Nonrheumatic pulmonary valve insufficiency: Secondary | ICD-10-CM | POA: Diagnosis not present

## 2019-06-03 DIAGNOSIS — I480 Paroxysmal atrial fibrillation: Secondary | ICD-10-CM | POA: Diagnosis present

## 2019-06-03 DIAGNOSIS — E785 Hyperlipidemia, unspecified: Secondary | ICD-10-CM | POA: Diagnosis present

## 2019-06-03 DIAGNOSIS — I5021 Acute systolic (congestive) heart failure: Secondary | ICD-10-CM | POA: Diagnosis present

## 2019-06-03 DIAGNOSIS — G4733 Obstructive sleep apnea (adult) (pediatric): Secondary | ICD-10-CM | POA: Diagnosis present

## 2019-06-03 DIAGNOSIS — G4730 Sleep apnea, unspecified: Secondary | ICD-10-CM | POA: Diagnosis present

## 2019-06-03 DIAGNOSIS — I130 Hypertensive heart and chronic kidney disease with heart failure and stage 1 through stage 4 chronic kidney disease, or unspecified chronic kidney disease: Secondary | ICD-10-CM | POA: Diagnosis present

## 2019-06-03 DIAGNOSIS — I513 Intracardiac thrombosis, not elsewhere classified: Secondary | ICD-10-CM | POA: Diagnosis present

## 2019-06-03 DIAGNOSIS — Z7982 Long term (current) use of aspirin: Secondary | ICD-10-CM | POA: Diagnosis not present

## 2019-06-03 DIAGNOSIS — F1010 Alcohol abuse, uncomplicated: Secondary | ICD-10-CM | POA: Diagnosis present

## 2019-06-03 DIAGNOSIS — I429 Cardiomyopathy, unspecified: Secondary | ICD-10-CM | POA: Diagnosis present

## 2019-06-03 DIAGNOSIS — N1831 Chronic kidney disease, stage 3a: Secondary | ICD-10-CM | POA: Diagnosis present

## 2019-06-03 DIAGNOSIS — I4891 Unspecified atrial fibrillation: Secondary | ICD-10-CM

## 2019-06-03 DIAGNOSIS — I4819 Other persistent atrial fibrillation: Secondary | ICD-10-CM | POA: Diagnosis present

## 2019-06-03 DIAGNOSIS — I428 Other cardiomyopathies: Secondary | ICD-10-CM

## 2019-06-03 DIAGNOSIS — Z20822 Contact with and (suspected) exposure to covid-19: Secondary | ICD-10-CM | POA: Diagnosis present

## 2019-06-03 HISTORY — DX: Other cardiomyopathies: I42.8

## 2019-06-03 HISTORY — DX: Unspecified atrial fibrillation: I48.91

## 2019-06-03 HISTORY — DX: Other persistent atrial fibrillation: I48.19

## 2019-06-03 HISTORY — DX: Alcohol abuse, uncomplicated: F10.10

## 2019-06-03 HISTORY — DX: Acute systolic (congestive) heart failure: I50.21

## 2019-06-03 LAB — ECHOCARDIOGRAM COMPLETE
Height: 73 in
Weight: 3088 oz

## 2019-06-03 LAB — BRAIN NATRIURETIC PEPTIDE: B Natriuretic Peptide: 509.4 pg/mL — ABNORMAL HIGH (ref 0.0–100.0)

## 2019-06-03 LAB — BASIC METABOLIC PANEL
Anion gap: 10 (ref 5–15)
BUN: 24 mg/dL — ABNORMAL HIGH (ref 8–23)
CO2: 23 mmol/L (ref 22–32)
Calcium: 9.5 mg/dL (ref 8.9–10.3)
Chloride: 106 mmol/L (ref 98–111)
Creatinine, Ser: 1.41 mg/dL — ABNORMAL HIGH (ref 0.61–1.24)
GFR calc Af Amer: 59 mL/min — ABNORMAL LOW (ref >60)
GFR calc non Af Amer: 51 mL/min — ABNORMAL LOW (ref >60)
Glucose, Bld: 120 mg/dL — ABNORMAL HIGH (ref 70–99)
Potassium: 4 mmol/L (ref 3.5–5.1)
Sodium: 139 mmol/L (ref 135–145)

## 2019-06-03 LAB — TSH: TSH: 0.737 u[IU]/mL (ref 0.350–4.500)

## 2019-06-03 LAB — SARS CORONAVIRUS 2 (TAT 6-24 HRS): SARS Coronavirus 2: NEGATIVE

## 2019-06-03 LAB — TROPONIN I (HIGH SENSITIVITY)
Troponin I (High Sensitivity): 38 ng/L — ABNORMAL HIGH (ref <18)
Troponin I (High Sensitivity): 25 ng/L — ABNORMAL HIGH (ref <18)

## 2019-06-03 LAB — CBC WITH DIFFERENTIAL/PLATELET
Abs Immature Granulocytes: 0.04 10*3/uL (ref 0.00–0.07)
Basophils Absolute: 0.1 10*3/uL (ref 0.0–0.1)
Basophils Relative: 1 %
Eosinophils Absolute: 0.2 10*3/uL (ref 0.0–0.5)
Eosinophils Relative: 2 %
HCT: 50.1 % (ref 39.0–52.0)
Hemoglobin: 16.3 g/dL (ref 13.0–17.0)
Immature Granulocytes: 1 %
Lymphocytes Relative: 16 %
Lymphs Abs: 1.4 10*3/uL (ref 0.7–4.0)
MCH: 33.4 pg (ref 26.0–34.0)
MCHC: 32.5 g/dL (ref 30.0–36.0)
MCV: 102.7 fL — ABNORMAL HIGH (ref 80.0–100.0)
Monocytes Absolute: 0.7 10*3/uL (ref 0.1–1.0)
Monocytes Relative: 8 %
Neutro Abs: 6.4 10*3/uL (ref 1.7–7.7)
Neutrophils Relative %: 72 %
Platelets: 152 10*3/uL (ref 150–400)
RBC: 4.88 MIL/uL (ref 4.22–5.81)
RDW: 12.3 % (ref 11.5–15.5)
WBC: 8.9 10*3/uL (ref 4.0–10.5)
nRBC: 0 % (ref 0.0–0.2)

## 2019-06-03 LAB — HIV ANTIBODY (ROUTINE TESTING W REFLEX): HIV Screen 4th Generation wRfx: NONREACTIVE

## 2019-06-03 LAB — POC SARS CORONAVIRUS 2 AG -  ED: SARS Coronavirus 2 Ag: NEGATIVE

## 2019-06-03 LAB — NOVEL CORONAVIRUS, NAA: SARS-CoV-2, NAA: NOT DETECTED

## 2019-06-03 IMAGING — DX DG CHEST 1V PORT
1 series · 1 of 1 positions shown · non-contrast
Comparison: None.

CLINICAL DATA: Patient with progressive shortness of breath for 2
months. Worse when lying down.

EXAM:
PORTABLE CHEST 1 VIEW

[chest ap]
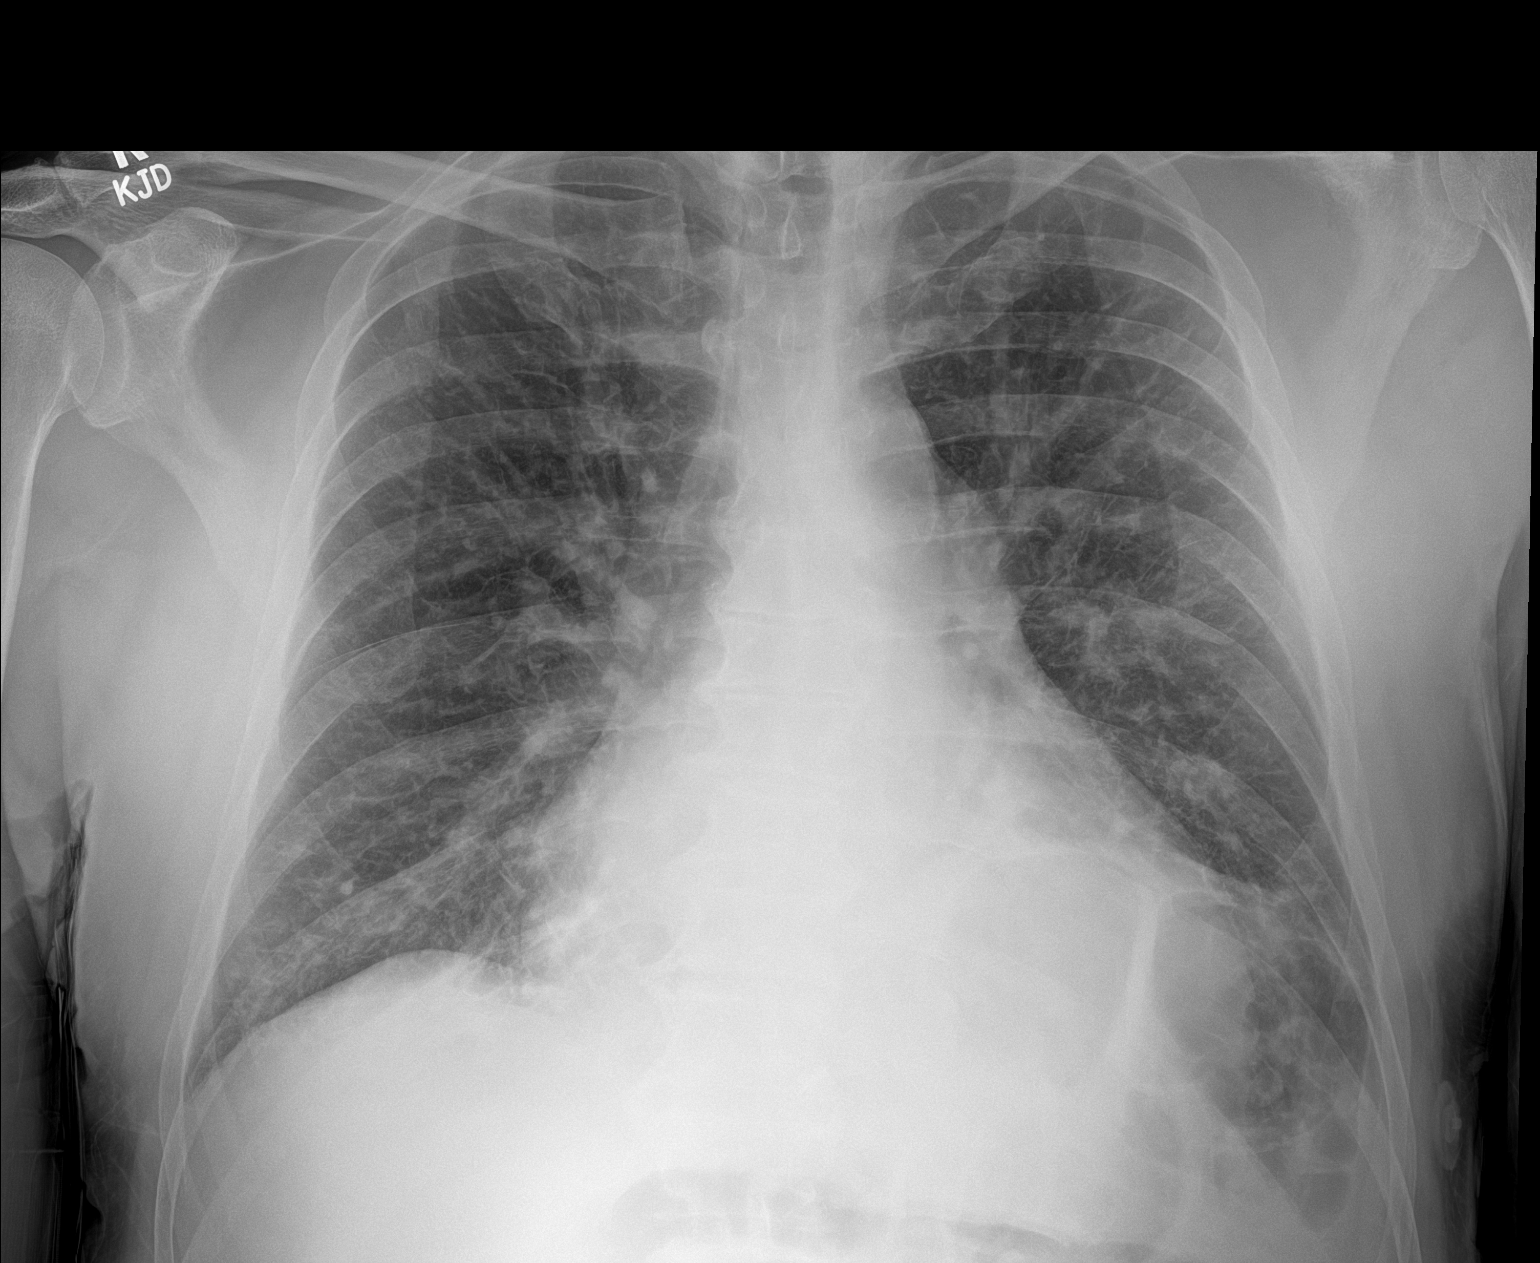

[1 of 1 positions shown; findings below may reference images not displayed]

FINDINGS: Cardiac contours upper limits of normal. Mild elevation of the left
hemidiaphragm. Mild bilateral interstitial pulmonary opacities. No
pleural effusion or pneumothorax. Thoracic spine degenerative
changes.
IMPRESSION: Cardiac contours upper limits of normal.

Mild bilateral interstitial opacities which may represent mild
interstitial edema or atypical infectious process.

## 2019-06-03 MED ORDER — SODIUM CHLORIDE 0.9% FLUSH: 3.0000 mL | INTRAVENOUS | Status: DC | PRN

## 2019-06-03 MED ORDER — DILTIAZEM HCL-DEXTROSE 125-5 MG/125ML-% IV SOLN (PREMIX)
5.0000 mg/h | INTRAVENOUS | Status: DC
  Administered 2019-06-03: 5 mg/h via INTRAVENOUS
  Filled 2019-06-03: qty 125

## 2019-06-03 MED ORDER — ADULT MULTIVITAMIN W/MINERALS CH
1.0000 | ORAL_TABLET | Freq: Every day | ORAL | Status: DC
  Administered 2019-06-03 – 2019-06-05 (×3): 1 via ORAL
  Filled 2019-06-03 (×3): qty 1

## 2019-06-03 MED ORDER — SODIUM CHLORIDE 0.9 % IV SOLN: 250.0000 mL | INTRAVENOUS | Status: DC | PRN

## 2019-06-03 MED ORDER — LORAZEPAM 1 MG PO TABS
1.0000 mg | ORAL_TABLET | ORAL | Status: DC | PRN
  Filled 2019-06-03: qty 2

## 2019-06-03 MED ORDER — APIXABAN 5 MG PO TABS
5.0000 mg | ORAL_TABLET | Freq: Two times a day (BID) | ORAL | Status: DC
  Administered 2019-06-03 – 2019-06-05 (×5): 5 mg via ORAL
  Filled 2019-06-03 (×6): qty 1

## 2019-06-03 MED ORDER — ZOLPIDEM TARTRATE 5 MG PO TABS
5.0000 mg | ORAL_TABLET | Freq: Every evening | ORAL | Status: DC | PRN
  Administered 2019-06-04 (×2): 5 mg via ORAL
  Filled 2019-06-03 (×3): qty 1

## 2019-06-03 MED ORDER — ONDANSETRON HCL 4 MG/2ML IJ SOLN: 4.0000 mg | Freq: Four times a day (QID) | INTRAMUSCULAR | Status: DC | PRN

## 2019-06-03 MED ORDER — DILTIAZEM LOAD VIA INFUSION
10.0000 mg | Freq: Once | INTRAVENOUS | Status: AC
  Administered 2019-06-03: 10 mg via INTRAVENOUS
  Filled 2019-06-03: qty 10

## 2019-06-03 MED ORDER — DILTIAZEM HCL 100 MG IV SOLR: 5.0000 mg/h | INTRAVENOUS | Status: DC

## 2019-06-03 MED ORDER — FOLIC ACID 1 MG PO TABS
1.0000 mg | ORAL_TABLET | Freq: Every day | ORAL | Status: DC
  Administered 2019-06-03 – 2019-06-05 (×3): 1 mg via ORAL
  Filled 2019-06-03 (×3): qty 1

## 2019-06-03 MED ORDER — ACETAMINOPHEN 325 MG PO TABS: 650.0000 mg | ORAL_TABLET | ORAL | Status: DC | PRN

## 2019-06-03 MED ORDER — THIAMINE HCL 100 MG/ML IJ SOLN: 100.0000 mg | Freq: Every day | INTRAMUSCULAR | Status: DC

## 2019-06-03 MED ORDER — LORAZEPAM 2 MG/ML IJ SOLN: 1.0000 mg | INTRAMUSCULAR | Status: DC | PRN

## 2019-06-03 MED ORDER — SODIUM CHLORIDE 0.9% FLUSH
3.0000 mL | Freq: Two times a day (BID) | INTRAVENOUS | Status: DC
  Administered 2019-06-04 – 2019-06-05 (×3): 3 mL via INTRAVENOUS

## 2019-06-03 MED ORDER — FUROSEMIDE 10 MG/ML IJ SOLN
40.0000 mg | Freq: Once | INTRAMUSCULAR | Status: AC
  Administered 2019-06-03: 07:00:00 40 mg via INTRAVENOUS
  Filled 2019-06-03: qty 4

## 2019-06-03 MED ORDER — FUROSEMIDE 10 MG/ML IJ SOLN
20.0000 mg | Freq: Two times a day (BID) | INTRAMUSCULAR | Status: DC
  Administered 2019-06-03 – 2019-06-05 (×4): 20 mg via INTRAVENOUS
  Filled 2019-06-03 (×4): qty 2

## 2019-06-03 MED ORDER — ENOXAPARIN SODIUM 100 MG/ML ~~LOC~~ SOLN
1.0000 mg/kg | Freq: Once | SUBCUTANEOUS | Status: AC
  Administered 2019-06-03: 07:00:00 90 mg via SUBCUTANEOUS
  Filled 2019-06-03: qty 0.9

## 2019-06-03 MED ORDER — METOPROLOL SUCCINATE ER 25 MG PO TB24
25.0000 mg | ORAL_TABLET | Freq: Every day | ORAL | Status: DC
  Administered 2019-06-03 – 2019-06-04 (×2): 25 mg via ORAL
  Filled 2019-06-03 (×2): qty 1

## 2019-06-03 MED ORDER — THIAMINE HCL 100 MG PO TABS
100.0000 mg | ORAL_TABLET | Freq: Every day | ORAL | Status: DC
  Administered 2019-06-03 – 2019-06-05 (×3): 100 mg via ORAL
  Filled 2019-06-03 (×3): qty 1

## 2019-06-03 NOTE — ED Notes (Signed)
Pt in doorway, very agitated stating that he would either like a room or to leave. I informed him that he has a pre-assigned bed on the third floor and that all we are waiting on is for them to clean a room after moving a patient to a different room. MD aware of wishes to [facility] and gave the OK. When I presented the option of AMA to patient he agreed to wait for 15 more minutes for a bed and if there was no assignment, he will leave. Pt has removed himself from all monitoring equipment and is sitting, dressed fully, in a chair in his room with bag ready to go.

## 2019-06-03 NOTE — ED Notes (Signed)
Pt refused the need for this RN to call his family to give them an update.

## 2019-06-03 NOTE — ED Notes (Signed)
Paged cardiology on-call to clarify new medication orders, page returned, instructed to give PO metoprolol and wait 1 hr before dc cardizem drip.

## 2019-06-03 NOTE — ED Notes (Signed)
ORDERED DINNER TRAYS 16:52

## 2019-06-03 NOTE — Progress Notes (Signed)
Patient has home CPAP in room and has placed himself on it for tonight.

## 2019-06-03 NOTE — Progress Notes (Signed)
Spoke with echo tech on call, will get complete echo done this morning

## 2019-06-03 NOTE — ED Triage Notes (Addendum)
Pt reports "progressive" shortness of breath for about two months. He has an appointment with a cardiologist on Tuesday. Pt says that when he lies down it is worse. Pt says that he was woke up from his sleep this morning with shortness of breath, and is "getting over a cold". Denies fevers. COVID test yesterday, pending results

## 2019-06-03 NOTE — ED Provider Notes (Addendum)
[facility]ENCY DEPARTMENT Provider Note   CSN: [id number] Arrival date & time: 06/03/19  6967     History Chief Complaint  Patient presents with  . Shortness of Breath    Steven Gray is a 69 y.o. male.  The history is provided by the patient.  Shortness of Breath Severity:  Severe Onset quality:  Gradual Duration:  2 months Timing:  Constant Progression:  Worsening Chronicity:  New Context: URI   Context: not strong odors and not weather changes   Context comment:  Lying flat, is "getting over a cold" Relieved by:  Nothing Exacerbated by: lying flat. Ineffective treatments:  None tried Associated symptoms: no abdominal pain, no chest pain, no cough, no fever, no sore throat and no vomiting   Associated symptoms comment:  Occasional palpitations Risk factors: no family hx of DVT and no hx of cancer   Patient presents with 2 months of progressive SOB with activity and at rest worst lying flat.  No CP, occasional palpitations.  Did not know he was in AFIB and has no idea when he flipped into it.  No f/c/r.  Reports "getting over a cold" was tested for covid as an outpatient but does not yet know the results.       Past Medical History:  Diagnosis Date  . Hyperlipidemia   . Increased prostate specific antigen (PSA) velocity   . Sleep apnea     Patient Active Problem List   Diagnosis Date Noted  . Right knee pain 12/07/2016  . 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 Heart palpitations 11/30/2012    History reviewed. No pertinent surgical history.     History reviewed. No pertinent family history.  Social History   Tobacco Use  . Smoking status: Never Smoker  . Smokeless tobacco: Never Used  Substance Use Topics  . Alcohol use: Yes  . Drug use: No    Home Medications Prior to Admission medications   Medication Sig Start Date End Date Taking? Authorizing Provider  aspirin 81 MG chewable tablet Chew 81 mg by mouth 2  (two) times daily. 01/30/19   [provider]  celecoxib (CELEBREX) 100 MG capsule Take 100 mg by mouth 2 (two) times daily. 04/14/19   [provider]  zolpidem (AMBIEN) 10 MG tablet Take 5-10 mg by mouth at bedtime as needed. 04/04/19   [provider]    Allergies    Patient has no known allergies.  Review of Systems   Review of Systems  Constitutional: Negative for fever.  HENT: Positive for congestion. Negative for sore throat.   Eyes: Negative for visual disturbance.  Respiratory: Positive for shortness of breath. Negative for cough.   Cardiovascular: Positive for palpitations. Negative for chest pain.  Gastrointestinal: Negative for abdominal pain, nausea and vomiting.  Genitourinary: Negative for difficulty urinating.  Musculoskeletal: Negative for arthralgias.  Neurological: Negative for dizziness.  Psychiatric/Behavioral: Negative for agitation.  All other systems reviewed and are negative.   Physical Exam Updated Vital Signs BP (!) 127/110   Pulse (!) 32   Temp (!) 97.3 F (36.3 C) (Oral)   Resp (!) 25   Ht '6\' 1"'  (1.854 m)   Wt 87.5 kg   SpO2 98%   BMI 25.46 kg/m   Physical Exam Vitals and nursing note reviewed.  Constitutional:      Appearance: Normal appearance. He is not diaphoretic.  HENT:     Head: Normocephalic and atraumatic.  Nose: Nose normal.  Eyes:     Conjunctiva/sclera: Conjunctivae normal.     Pupils: Pupils are equal, round, and reactive to light.  Cardiovascular:     Rate and Rhythm: Tachycardia present. Rhythm irregular.     Pulses: Normal pulses.     Heart sounds: Normal heart sounds.  Pulmonary:     Effort: Pulmonary effort is normal.     Breath sounds: Decreased breath sounds and rales present.  Abdominal:     General: Abdomen is flat. Bowel sounds are normal.     Tenderness: There is no abdominal tenderness. There is no guarding.  Musculoskeletal:        General: No tenderness. Normal range of motion.      Cervical back: Normal range of motion and neck supple.     Right lower leg: No edema.     Left lower leg: No edema.  Skin:    General: Skin is warm and dry.     Capillary Refill: Capillary refill takes less than 2 seconds.  Neurological:     General: No focal deficit present.     Mental Status: He is alert and oriented to person, place, and time.     Deep Tendon Reflexes: Reflexes normal.  Psychiatric:        Mood and Affect: Mood normal.        Behavior: Behavior normal.     ED Results / Procedures / Treatments   Labs (all labs ordered are listed, but only abnormal results are displayed) Results for orders placed or performed during the hospital encounter of 06/03/19  SARS CORONAVIRUS 2 (TAT 6-24 HRS) Nasopharyngeal Nasopharyngeal Swab   Specimen: Nasopharyngeal Swab  Result Value Ref Range   SARS Coronavirus 2 NEGATIVE NEGATIVE  CBC with Differential/Platelet  Result Value Ref Range   WBC 8.9 4.0 - 10.5 K/uL   RBC 4.88 4.22 - 5.81 MIL/uL   Hemoglobin 16.3 13.0 - 17.0 g/dL   HCT 50.1 39.0 - 52.0 %   MCV 102.7 (H) 80.0 - 100.0 fL   MCH 33.4 26.0 - 34.0 pg   MCHC 32.5 30.0 - 36.0 g/dL   RDW 12.3 11.5 - 15.5 %   Platelets 152 150 - 400 K/uL   nRBC 0.0 0.0 - 0.2 %   Neutrophils Relative % 72 %   Neutro Abs 6.4 1.7 - 7.7 K/uL   Lymphocytes Relative 16 %   Lymphs Abs 1.4 0.7 - 4.0 K/uL   Monocytes Relative 8 %   Monocytes Absolute 0.7 0.1 - 1.0 K/uL   Eosinophils Relative 2 %   Eosinophils Absolute 0.2 0.0 - 0.5 K/uL   Basophils Relative 1 %   Basophils Absolute 0.1 0.0 - 0.1 K/uL   Immature Granulocytes 1 %   Abs Immature Granulocytes 0.04 0.00 - 0.07 K/uL  Basic metabolic panel  Result Value Ref Range   Sodium 139 135 - 145 mmol/L   Potassium 4.0 3.5 - 5.1 mmol/L   Chloride 106 98 - 111 mmol/L   CO2 23 22 - 32 mmol/L   Glucose, Bld 120 (H) 70 - 99 mg/dL   BUN 24 (H) 8 - 23 mg/dL   Creatinine, Ser 1.41 (H) 0.61 - 1.24 mg/dL   Calcium 9.5 8.9 - 10.3 mg/dL    GFR calc non Af Amer 51 (L) >60 mL/min   GFR calc Af Amer 59 (L) >60 mL/min   Anion gap 10 5 - 15  Brain natriuretic peptide  Result Value Ref Range  B Natriuretic Peptide 509.4 (H) 0.0 - 100.0 pg/mL  TSH  Result Value Ref Range   TSH 0.737 0.350 - 4.500 uIU/mL  HIV Antibody (routine testing w rflx)  Result Value Ref Range   HIV Screen 4th Generation wRfx NON REACTIVE NON REACTIVE  POC SARS Coronavirus 2 Ag-ED - Nasal Swab (BD Veritor Kit)  Result Value Ref Range   SARS Coronavirus 2 Ag NEGATIVE NEGATIVE  ECHOCARDIOGRAM COMPLETE  Result Value Ref Range   Weight 3,088 oz   Height 73 in   BP 135/110 mmHg  Troponin I (High Sensitivity)  Result Value Ref Range   Troponin I (High Sensitivity) 38 (H) <18 ng/L  Troponin I (High Sensitivity)  Result Value Ref Range   Troponin I (High Sensitivity) 25 (H) <18 ng/L   DG Chest Portable 1 View  Result Date: 06/03/2019 CLINICAL DATA:  Patient with progressive shortness of breath for 2 months. Worse when lying down. EXAM: PORTABLE CHEST 1 VIEW COMPARISON:  None. FINDINGS: Cardiac contours upper limits of normal. Mild elevation of the left hemidiaphragm. Mild bilateral interstitial pulmonary opacities. No pleural effusion or pneumothorax. Thoracic spine degenerative changes. IMPRESSION: Cardiac contours upper limits of normal. Mild bilateral interstitial opacities which may represent mild interstitial edema or atypical infectious process. Electronically Signed   By: Lovey Newcomer M.D.   On: 06/03/2019 06:05   ECHOCARDIOGRAM COMPLETE  Result Date: 06/03/2019   ECHOCARDIOGRAM REPORT   Patient Name:   Steven Gray [facility] Date of Exam: 06/03/2019 Medical Rec #:  [id number]         Height:       73.0 in Accession #:    [id number]        Weight:       193.0 lb Date of Birth:  Jan 06, 1951         BSA:          2.12 m Patient Age:    25 years          BP:           128/110 mmHg Patient Gender: M                 HR:           93 bpm. Exam Location:  Inpatient  Procedure: 2D Echo Indications:    dyspnea  History:        Patient has no prior history of Echocardiogram examinations.                 Risk Factors:Dyslipidemia.  Sonographer:    Jannett Celestine RDCS (AE) Referring Phys: [phone] HAO MENG IMPRESSIONS  1. Left ventricular ejection fraction, by visual estimation, is 30 to 35%. The left ventricle has moderate to severely decreased function. There is no left ventricular hypertrophy.  2. Left ventricular diastolic parameters are indeterminate.  3. The left ventricle demonstrates global hypokinesis.  4. Atrial fibrillation makes accurate assessment of LVEF diffciult.  5. Global right ventricle has moderately reduced systolic function.The right ventricular size is normal. No increase in right ventricular wall thickness.  6. Left atrial size was severely dilated.  7. Right atrial size was normal.  8. The mitral valve is abnormal. Mild mitral valve regurgitation.  9. The tricuspid valve is normal in structure. 10. The tricuspid valve is normal in structure. Tricuspid valve regurgitation is moderate. 11. The aortic valve is grossly normal. Aortic valve regurgitation is not visualized. 12. Pulmonic regurgitation is mild. 13. The pulmonic valve was normal in structure.  Pulmonic valve regurgitation is mild. 14. Aortic dilatation noted. 15. There is mild dilatation of the ascending aorta. 16. Moderately elevated pulmonary artery systolic pressure. 17. The interatrial septum was not well visualized. FINDINGS  Left Ventricle: Left ventricular ejection fraction, by visual estimation, is 30 to 35%. The left ventricle has moderate to severely decreased function. The left ventricle demonstrates global hypokinesis. There is no left ventricular hypertrophy. Left ventricular diastolic parameters are indeterminate. Atrial fibrillation makes accurate assessment of LVEF diffciult. Right Ventricle: The right ventricular size is normal. No increase in right ventricular wall thickness. Global RV  systolic function is has moderately reduced systolic function. The tricuspid regurgitant velocity is 2.76 m/s, and with an assumed right atrial pressure of 10 mmHg, the estimated right ventricular systolic pressure is moderately elevated at 40.4 mmHg. Left Atrium: Left atrial size was severely dilated. Right Atrium: Right atrial size was normal in size Pericardium: There is no evidence of pericardial effusion. Mitral Valve: The mitral valve is abnormal. There is mild thickening of the mitral valve leaflet(s). Mild mitral valve regurgitation. Tricuspid Valve: The tricuspid valve is normal in structure. Tricuspid valve regurgitation is moderate. Aortic Valve: The aortic valve is grossly normal. Aortic valve regurgitation is not visualized. Pulmonic Valve: The pulmonic valve was normal in structure. Pulmonic valve regurgitation is mild. Pulmonic regurgitation is mild. Aorta: The aortic root is normal in size and structure and aortic dilatation noted. There is mild dilatation of the ascending aorta. Venous: The inferior vena cava was not well visualized. IAS/Shunts: The interatrial septum was not well visualized.  LEFT VENTRICLE PLAX 2D LVIDd:         5.80 cm LVIDs:         4.40 cm LV PW:         1.00 cm LV IVS:        1.00 cm LVOT diam:     2.30 cm LV SV:         79 ml LV SV Index:   37.01 LVOT Area:     4.15 cm  RIGHT VENTRICLE RV S prime:     8.16 cm/s TAPSE (M-mode): 1.1 cm LEFT ATRIUM             Index       RIGHT ATRIUM           Index LA diam:        3.70 cm 1.75 cm/m  RA Area:     19.10 cm LA Vol (A2C):   87.4 ml 41.24 ml/m RA Volume:   47.80 ml  22.55 ml/m LA Vol (A4C):   89.3 ml 42.14 ml/m LA Biplane Vol: 95.7 ml 45.16 ml/m  AORTIC VALVE LVOT Vmax:   66.60 cm/s LVOT Vmean:  49.500 cm/s LVOT VTI:    0.130 m  AORTA Ao Root diam: 4.20 cm MITRAL VALVE                       TRICUSPID VALVE MV Area (PHT): 3.85 cm            TR Peak grad:   30.4 mmHg MV PHT:        57.13 msec          TR Vmax:        285.00  cm/s MV Decel Time: 197 msec MV E velocity: 86.10 cm/s 103 cm/s SHUNTS  Systemic VTI:  0.13 m                                    Systemic Diam: 2.30 cm  Dorris Carnes MD Electronically signed by Dorris Carnes MD Signature Date/Time: 06/03/2019/11:41:21 AM    Final     DG Chest Portable 1 View  Result Date: 06/03/2019 CLINICAL DATA:  Patient with progressive shortness of breath for 2 months. Worse when lying down. EXAM: PORTABLE CHEST 1 VIEW COMPARISON:  None. FINDINGS: Cardiac contours upper limits of normal. Mild elevation of the left hemidiaphragm. Mild bilateral interstitial pulmonary opacities. No pleural effusion or pneumothorax. Thoracic spine degenerative changes. IMPRESSION: Cardiac contours upper limits of normal. Mild bilateral interstitial opacities which may represent mild interstitial edema or atypical infectious process. Electronically Signed   By: Lovey Newcomer M.D.   On: 06/03/2019 06:05    EKG EKG Interpretation  Date/Time:  Saturday June 03 2019 05:27:27 EST Ventricular Rate:  113 PR Interval:    QRS Duration: 94 QT Interval:  374 QTC Calculation: 513 R Axis:   97 Text Interpretation: Atrial fibrillation with rapid ventricular response Nonspecific T wave abnormality Confirmed by Dory Horn) on 06/03/2019 5:34:07 AM   Radiology DG Chest Portable 1 View  Result Date: 06/03/2019 CLINICAL DATA:  Patient with progressive shortness of breath for 2 months. Worse when lying down. EXAM: PORTABLE CHEST 1 VIEW COMPARISON:  None. FINDINGS: Cardiac contours upper limits of normal. Mild elevation of the left hemidiaphragm. Mild bilateral interstitial pulmonary opacities. No pleural effusion or pneumothorax. Thoracic spine degenerative changes. IMPRESSION: Cardiac contours upper limits of normal. Mild bilateral interstitial opacities which may represent mild interstitial edema or atypical infectious process. Electronically Signed   By: Lovey Newcomer M.D.    On: 06/03/2019 06:05    Procedures Procedures (including critical care time)  Medications Ordered in ED Medications  enoxaparin (LOVENOX) injection 90 mg (has no administration in time range)  diltiazem (CARDIZEM) 1 mg/mL load via infusion 10 mg (has no administration in time range)    And  diltiazem (CARDIZEM) 125 mg in dextrose 5% 125 mL (1 mg/mL) infusion (has no administration in time range)    ED Course  I have reviewed the triage vital signs and the nursing notes.  Pertinent labs & imaging results that were available during my care of the patient were reviewed by me and considered in my medical decision making (see chart for details).     CHA2DS2-VASc Score = 2 The patient's score is based upon: CHF History: Yes HTN History: No Age : 39-74 Diabetes History: No Stroke History: No Vascular Disease History: No Gender: Male      ASSESSMENT AND PLAN: Paroxysmal Atrial Fibrillation (ICD10:  I48.0) The patient's CHA2DS2-VASc score is 2, indicating a 2.2% annual risk of stroke.  Regardless, it is very unclear when this patient flipped into AFIB with RVR as the symptoms have been going on for 2 months so he will not be mechanically cardioverted in the ER.       Signed,  Kerstin Crusoe K Orell Hurtado-Rasch, MD    06/03/2019 6:23 AM    MDM Reviewed: nursing note, vitals and previous chart Interpretation: labs, ECG and x-ray Total time providing critical care: 30-74 minutes (diltiazem drip ). This excludes time spent performing separately reportable procedures and services. Consults: admitting MD  CRITICAL CARE Performed by: Manoah Deckard K Devyon Keator-Rasch Total critical care time: 60 minutes  Critical care time was exclusive of separately billable procedures and treating other patients. Critical care was necessary to treat or prevent imminent or life-threatening deterioration. Critical care was time spent personally by me on the following activities: development of treatment plan with  patient and/or surrogate as well as nursing, discussions with consultants, evaluation of patient's response to treatment, examination of patient, obtaining history from patient or surrogate, ordering and performing treatments and interventions, ordering and review of laboratory studies, ordering and review of radiographic studies, pulse oximetry and re-evaluation of patient's condition.  Final Clinical Impression(s) / ED Diagnoses AFIB with RVR with pulmonary edema      Khaya Theissen, MD 06/03/19 [id number]    Randal Buba, Manuela Halbur, MD 06/04/19 0140

## 2019-06-03 NOTE — Consult Note (Addendum)
Cardiology Consultation:   Patient ID: Steven Gray MRN: [id number]; DOB: Mar 03, 1951  Admit date: 06/03/2019 Date of Consult: 06/03/2019  Primary Care Provider: Geoffry Paradise, MD Primary Cardiologist: Dr. Graciela Husbands Primary Electrophysiologist:  None    Patient Profile:   Steven Gray is a 69 y.o. male with a hx of diet-controlled HLD and OSA who is being seen today for the evaluation of atrial fibrillation with RVR and acute CHF at the request of Dr. Ophelia Charter.  History of Present Illness:   Steven Gray is a 69 year old male with past medical history of diet-controlled hyperlipidemia and obstructive sleep apnea who presented with a 71-month onset of worsening dyspnea, orthopnea and PND.  Patient was previously seen by Dr. Excell Seltzer in 2014 for palpitation.  ETT at the time was low risk.  Heart monitor showed sinus rhythm with PAC and PVC, otherwise no significant arrhythmia.  On arrival to the ED at [facility] on 06/03/2019, he was noted to be in new atrial fibrillation with RVR with heart rate of 110-120s.  Chest x-ray showed mild interstitial edema.  BNP was elevated at 509.4.  High-sensitivity troponin 38.  Creatinine 1.41.  Although patient had Covid test yesterday, this did not come back.  Repeat Covid test in the emergency room was negative.  He has been having a cold-like symptom for the past 2 months.  Otherwise he denies any chest pain or palpitation.   Heart Pathway Score:     Past Medical History:  Diagnosis Date  . Hyperlipidemia    does not take medication for this, denies  . Increased prostate specific antigen (PSA) velocity   . Sleep apnea    wears CPAP    Past Surgical History:  Procedure Laterality Date  . MENISCUS REPAIR       Home Medications:  Prior to Admission medications   Medication Sig Start Date End Date Taking? Authorizing Provider  aspirin 81 MG chewable tablet Chew 81 mg by mouth 2 (two) times daily. 01/30/19   [provider]    celecoxib (CELEBREX) 100 MG capsule Take 100 mg by mouth 2 (two) times daily. 04/14/19   [provider]  zolpidem (AMBIEN) 10 MG tablet Take 5-10 mg by mouth at bedtime as needed. 04/04/19   [provider]    Inpatient Medications: Scheduled Meds:  Continuous Infusions: . diltiazem (CARDIZEM) infusion 5 mg/hr (06/03/19 0700)   PRN Meds:   Allergies:   No Known Allergies  Social History:   Social History   Socioeconomic History  . Marital status: Married    Spouse name: Not on file  . Number of children: Not on file  . Years of education: Not on file  . Highest education level: Not on file  Occupational History  . Not on file  Tobacco Use  . Smoking status: Never Smoker  . Smokeless tobacco: Never Used  Substance and Sexual Activity  . Alcohol use: Yes  . Drug use: No  . Sexual activity: Not on file  Other Topics Concern  . Not on file  Social History Narrative   Married with 2 sons, nonsmoker, social ETOH, works as Psychologist, educational in Geologist, engineering. Frequent exercise.   Social Determinants of Health   Financial Resource Strain:   . Difficulty of Paying Living Expenses: Not on file  Food Insecurity:   . Worried About Programme researcher, broadcasting/film/video in the Last Year: Not on file  . Ran Out of Food in the Last Year: Not on file  Transportation Needs:   . Freight forwarder (Medical): Not on file  . Lack of Transportation (Non-Medical): Not on file  Physical Activity:   . Days of Exercise per Week: Not on file  . Minutes of Exercise per Session: Not on file  Stress:   . Feeling of Stress : Not on file  Social Connections:   . Frequency of Communication with Friends and Family: Not on file  . Frequency of Social Gatherings with Friends and Family: Not on file  . Attends Religious Services: Not on file  . Active Member of Clubs or Organizations: Not on file  . Attends Banker Meetings: Not on file  . Marital Status: Not on file  Intimate  Partner Violence:   . Fear of Current or Ex-Partner: Not on file  . Emotionally Abused: Not on file  . Physically Abused: Not on file  . Sexually Abused: Not on file    Family History:    Family History  Problem Relation Age of Onset  . Polycystic kidney disease Mother   . Vascular Disease Father        vascular dementia  . CAD Maternal Grandfather   . Atrial fibrillation Neg Hx      ROS:  Please see the history of present illness.   All other ROS reviewed and negative.     Physical Exam/Data:   Vitals:   06/03/19 0534 06/03/19 0600 06/03/19 0700 06/03/19 0730  BP:  (!) 127/110 (!) 129/109 (!) 135/110  Pulse:  (!) 113 87 97  Resp:  (!) 25 (!) 24 20  Temp:      TempSrc:      SpO2:  98% 97% 95%  Weight: 87.5 kg     Height: 6\' 1"  (1.854 m)      No intake or output data in the 24 hours ending 06/03/19 0911 Last 3 Weights 06/03/2019 05/01/2019 04/17/2019  Weight (lbs) 193 lb 195 lb 195 lb  Weight (kg) 87.544 kg 88.451 kg 88.451 kg     Body mass index is 25.46 kg/m.  General:  Well nourished, well developed, in no acute distress HEENT: normal Lymph: no adenopathy Neck: no JVD Endocrine:  No thryomegaly Vascular: No carotid bruits; FA pulses 2+ bilaterally without bruits  Cardiac: Irregularly irregular; no murmur  Lungs:  clear to auscultation bilaterally, no wheezing, rhonchi or rales  Abd: soft, nontender, no hepatomegaly  Ext: Trace left lower extremity edema, no right lower extremity edema. Musculoskeletal:  No deformities, BUE and BLE strength normal and equal Skin: warm and dry  Neuro:  CNs 2-12 intact, no focal abnormalities noted Psych:  Normal affect   EKG:  The EKG was personally reviewed and demonstrates: Atrial fibrillation with RVR Telemetry:  Telemetry was personally reviewed and demonstrates: Atrial fibrillation, no significant ventricular ectopy  Relevant CV Studies:  Low risk ETT 01/12/2013  Heart monitor in 2014 demonstrated sinus rhythm,  occasional sinus bradycardia, PVCs, rare ventricular ectopy.  Laboratory Data:  High Sensitivity Troponin:   Recent Labs  Lab 06/03/19 0550  TROPONINIHS 38*     Chemistry Recent Labs  Lab 06/03/19 0550  NA 139  K 4.0  CL 106  CO2 23  GLUCOSE 120*  BUN 24*  CREATININE 1.41*  CALCIUM 9.5  GFRNONAA 51*  GFRAA 59*  ANIONGAP 10    No results for input(s): PROT, ALBUMIN, AST, ALT, ALKPHOS, BILITOT in the last 168 hours. Hematology Recent Labs  Lab 06/03/19 0550  WBC 8.9  RBC 4.88  HGB 16.3  HCT 50.1  MCV 102.7*  MCH 33.4  MCHC 32.5  RDW 12.3  PLT 152   BNP Recent Labs  Lab 06/03/19 0550  BNP 509.4*    DDimer No results for input(s): DDIMER in the last 168 hours.   Radiology/Studies:  DG Chest Portable 1 View  Result Date: 06/03/2019 CLINICAL DATA:  Patient with progressive shortness of breath for 2 months. Worse when lying down. EXAM: PORTABLE CHEST 1 VIEW COMPARISON:  None. FINDINGS: Cardiac contours upper limits of normal. Mild elevation of the left hemidiaphragm. Mild bilateral interstitial pulmonary opacities. No pleural effusion or pneumothorax. Thoracic spine degenerative changes. IMPRESSION: Cardiac contours upper limits of normal. Mild bilateral interstitial opacities which may represent mild interstitial edema or atypical infectious process. Electronically Signed   By: Lovey Newcomer M.D.   On: 06/03/2019 06:05   {  Assessment and Plan:   1. Acute heart failure: Unclear systolic or diastolic, I would not be too surprised if his EF is low.  Will order echocardiogram for this morning.  He received 40 mg IV Lasix and had acute urinary output in the ED.  His lungs is clear by this point.  He still have trace amount of swelling in the left lower extremity, however this has been chronic for the past 2 months after left knee surgery.  He had venous Doppler to rule out DVT recently.  2. Atrial fibrillation with RVR: Unknown duration, this is a new diagnosis for  him. CHA2DS2-Vasc score is 1-2 (age +/- CHF).  Continue IV diltiazem for rate control, likely transition to either metoprolol tartrate or metoprolol succinate based on his ejection fraction on the echocardiogram.  I would avoid a high dose of rate control agent as he is a very athletic person and previous baseline heart rate has been in the 50s range.  Consider start on Eliquis 5 mg twice daily.  I discussed the case with Dr. Caryl Comes, who felt if his EF is low, he would be more inclined to consider TEE DCCV during this admission, however if his EF is normal, may consider outpatient DCCV after 3 weeks of anticoagulation therapy.  Obtain TSH.  -will need to limit alcohol and caffeinated drink.  He drinks about 2 glasses of wine with his wife on a regular basis and 1 cup of coffee daily.  3. AKI: Creatinine 1.41.  Unclear if acute versus chronic.  Will trend renal function in the future.  4. Borderline elevated troponin: Denies any anginal symptom, likely demand ischemia in the setting of heart failure and A. fib with RVR.      For questions or updates, please contact [place] Please consult [email or web address] for contact info under     Signed, Almyra Deforest, [place]  06/03/2019 9:11 AM  Pt interviewed and examined 16-month history of notable and progressive dyspnea both with exertion and at rest.  Orthopnea x2 weeks and PND x2 days.  No abdominal distention or peripheral edema. No palpitations.  Physical examination notable for jugular venous distention 8-10 cm with positive HJR, and this following diuresis  Thromboembolic risk factors ( age -72, CHF-1??) for a CHADSVASc Score of 1-2  Echocardiogram-preliminary (my ignorant view) modest LV dysfunction  The chest x-ray and BNP consistent with congestive heart failure.  No fever or leukocytosis to suggest infection.  Assessment recommendations  Atrial fibrillation with a rapid ventricular response  Congestive heart failure-acute systolic class  III  Sleep apnea-treated   The hopeful scenario would be that his atrial  fibrillation is responsible for his congestive heart failure and LV dysfunction as a tachycardia induced cardiomyopathy.  This is suggested by the slowly progressive nature of his symptoms and the lack of palpitations.  On the other hand, it certainly could be that he has a cardiomyopathy and atrial fibrillation is a consequence.  We will wait and see what the left atrial dimension looks like in terms of the likelihood of one of these scenarios or the other.  Given his LV dysfunction I would favor cardioversion sooner rather than later.  He is feeling considerably better with rate control and diuresis.  Given that tomorrow is Sunday, one strategy, if he is feeling good would be to discharge him, have him quarantine himself and then come in later in the week for TEE cardioversion.  A second strategy would be to keep him till Monday for TEE guided cardioversion.  With LV dysfunction, would put him on metoprolol succinate.  His blood pressure may not tolerate ACE/ARB but if so would put him on low-dose losartan.  Anticoagulation is indicated the duration of which remains to be determined.  He has a CHA2DS2-VASc score of at least 1 and possibly 2 dependent on his LVEF.  He is compliant with his sleep apnea  No obvious secondary causes is yet evident.  He did have knee surgery prior to the onset of all of this which raises the remote possibility of DVT/PE.  Anticoagulation is indicated in any case.  If his DVT were postoperative anticoagulation duration would be truncated.

## 2019-06-03 NOTE — Progress Notes (Signed)
  Echocardiogram 2D Echocardiogram has been performed.  Steven Gray 06/03/2019, 11:14 AM

## 2019-06-03 NOTE — ED Notes (Signed)
Dr. Yates at bedside.  

## 2019-06-03 NOTE — H&P (Signed)
History and Physical    Steven Gray OVZ:[id number] DOB: Jul 13, 1950 DOA: 06/03/2019  PCP: Burnard Bunting, MD Consultants:  Barbaraann Barthel - sports; [employer]; Marlou Porch - cardiology (initial appointment next week) Patient coming from:  Home - lives with wife, Letta Median; [place]: Wife, [phone]  Chief Complaint: SOB  HPI: Steven Gray is a 69 y.o. male with medical history significant of OSA: and HLD presenting with progressive SOB.  He reports that he had a knee surgery and then tore his calf muscle.  He has been SOB with activity like running, thought he was out of shape.  Somewhere along the way he decided it was more than that - he has been working out hard.  The last couple of nights he couldn't sleep, was very SOB with lying down.  It has crept up over the last few months - prevalent with exercise.  He did not feel like his heart as racing.  He does not wear a Radiation protection practitioner.  He checked his BP about 2 weeks ago - HR was 105.  He "just had a physical" on 11/8.  He had a little sniffle yesterday and went for a COVID test, not really due to the SOB.  No known COVID contacts.  No chest pain.   ED Course:  SOB x weeks.  Found to be in afib with RVR - given Lovenox.  CXR with B infiltrates - ? COVID.  Review of Systems: As per HPI; otherwise review of systems reviewed and negative.   Ambulatory Status:  Ambulates without assistance  Past Medical History:  Diagnosis Date  . Hyperlipidemia    does not take medication for this, denies  . Increased prostate specific antigen (PSA) velocity   . Sleep apnea    wears CPAP    Past Surgical History:  Procedure Laterality Date  . MENISCUS REPAIR      Social History   Socioeconomic History  . Marital status: Married    Spouse name: Not on file  . Number of children: Not on file  . Years of education: Not on file  . Highest education level: Not on file  Occupational History  . Occupation: warehousing  Tobacco Use  . Smoking status:  Never Smoker  . Smokeless tobacco: Never Used  Substance and Sexual Activity  . Alcohol use: Yes    Comment: 15+ drinks/week wine  . Drug use: No  . Sexual activity: Not on file  Other Topics Concern  . Not on file  Social History Narrative   Married with 2 sons, nonsmoker, social ETOH, works as Programme researcher, broadcasting/film/video in [employer], prison and probation services. Frequent exercise.   Social Determinants of Health   Financial Resource Strain:   . Difficulty of Paying Living Expenses: Not on file  Food Insecurity:   . Worried About Charity fundraiser in the Last Year: Not on file  . Ran Out of Food in the Last Year: Not on file  Transportation Needs:   . Lack of Transportation (Medical): Not on file  . Lack of Transportation (Non-Medical): Not on file  Physical Activity:   . Days of Exercise per Week: Not on file  . Minutes of Exercise per Session: Not on file  Stress:   . Feeling of Stress : Not on file  Social Connections:   . Frequency of Communication with Friends and Family: Not on file  . Frequency of Social Gatherings with Friends and Family: Not on file  . Attends Religious Services: Not on file  .  Active Member of Clubs or Organizations: Not on file  . Attends Banker Meetings: Not on file  . Marital Status: Not on file  Intimate Partner Violence:   . Fear of Current or Ex-Partner: Not on file  . Emotionally Abused: Not on file  . Physically Abused: Not on file  . Sexually Abused: Not on file    No Known Allergies  Family History  Problem Relation Age of Onset  . Polycystic kidney disease Mother   . Vascular Disease Father        vascular dementia  . CAD Maternal Grandfather   . Atrial fibrillation Neg Hx     Prior to Admission medications   Medication Sig Start Date End Date Taking? Authorizing Provider  aspirin 81 MG chewable tablet Chew 81 mg by mouth 2 (two) times daily. 01/30/19   [provider]  celecoxib (CELEBREX) 100 MG capsule Take 100 mg by mouth 2 (two)  times daily. 04/14/19   [provider]  zolpidem (AMBIEN) 10 MG tablet Take 5-10 mg by mouth at bedtime as needed. 04/04/19   [provider]    Physical Exam: Vitals:   06/03/19 1530 06/03/19 1545 06/03/19 1600 06/03/19 1615  BP: (!) 156/92     Pulse: (!) 43 86 76 (!) 53  Resp: 14 (!) 22 (!) 22 19  Temp:      TempSrc:      SpO2: 97% 97% 98% 96%  Weight:      Height:         . General:  Appears calm and comfortable and is NAD . Eyes:  PERRL, EOMI, normal lids, iris . ENT:  grossly normal hearing, lips & tongue, mmm; appropriate dentition . Neck:  no LAD, masses or thyromegaly . Cardiovascular:  Irregularly irregular rate/rhythm, no m/r/g.  . Respiratory:   CTA bilaterally with no wheezes/rales/rhonchi.  Mildly increased respiratory effort, particularly with any effort. . Abdomen:  soft, NT, ND, NABS . Back:   normal alignment, no CVAT . Skin:  no rash or induration seen on limited exam . Musculoskeletal:  grossly normal tone BUE/BLE, good ROM, no bony abnormality; mild chronic L ankle edema after recent calf muscle tear . Psychiatric:  grossly normal mood and affect, speech fluent and appropriate, AOx3 . Neurologic:  CN 2-12 grossly intact, moves all extremities in coordinated fashion    Radiological Exams on Admission: DG Chest Portable 1 View  Result Date: 06/03/2019 CLINICAL DATA:  Patient with progressive shortness of breath for 2 months. Worse when lying down. EXAM: PORTABLE CHEST 1 VIEW COMPARISON:  None. FINDINGS: Cardiac contours upper limits of normal. Mild elevation of the left hemidiaphragm. Mild bilateral interstitial pulmonary opacities. No pleural effusion or pneumothorax. Thoracic spine degenerative changes. IMPRESSION: Cardiac contours upper limits of normal. Mild bilateral interstitial opacities which may represent mild interstitial edema or atypical infectious process. Electronically Signed   By: Annia Belt M.D.   On: 06/03/2019 06:05    ECHOCARDIOGRAM COMPLETE  Result Date: 06/03/2019   ECHOCARDIOGRAM REPORT   Patient Name:   Steven Gray [facility] Date of Exam: 06/03/2019 Medical Rec #:  [id number]         Height:       73.0 in Accession #:    [id number]        Weight:       193.0 lb Date of Birth:  February 09, 1951         BSA:  2.12 m Patient Age:    68 years          BP:           128/110 mmHg Patient Gender: M                 HR:           93 bpm. Exam Location:  Inpatient Procedure: 2D Echo Indications:    dyspnea  History:        Patient has no prior history of Echocardiogram examinations.                 Risk Factors:Dyslipidemia.  Sonographer:    Celene Skeen RDCS (AE) Referring Phys: [phone] HAO MENG IMPRESSIONS  1. Left ventricular ejection fraction, by visual estimation, is 30 to 35%. The left ventricle has moderate to severely decreased function. There is no left ventricular hypertrophy.  2. Left ventricular diastolic parameters are indeterminate.  3. The left ventricle demonstrates global hypokinesis.  4. Atrial fibrillation makes accurate assessment of LVEF diffciult.  5. Global right ventricle has moderately reduced systolic function.The right ventricular size is normal. No increase in right ventricular wall thickness.  6. Left atrial size was severely dilated.  7. Right atrial size was normal.  8. The mitral valve is abnormal. Mild mitral valve regurgitation.  9. The tricuspid valve is normal in structure. 10. The tricuspid valve is normal in structure. Tricuspid valve regurgitation is moderate. 11. The aortic valve is grossly normal. Aortic valve regurgitation is not visualized. 12. Pulmonic regurgitation is mild. 13. The pulmonic valve was normal in structure. Pulmonic valve regurgitation is mild. 14. Aortic dilatation noted. 15. There is mild dilatation of the ascending aorta. 16. Moderately elevated pulmonary artery systolic pressure. 17. The interatrial septum was not well visualized. FINDINGS  Left Ventricle: Left  ventricular ejection fraction, by visual estimation, is 30 to 35%. The left ventricle has moderate to severely decreased function. The left ventricle demonstrates global hypokinesis. There is no left ventricular hypertrophy. Left ventricular diastolic parameters are indeterminate. Atrial fibrillation makes accurate assessment of LVEF diffciult. Right Ventricle: The right ventricular size is normal. No increase in right ventricular wall thickness. Global RV systolic function is has moderately reduced systolic function. The tricuspid regurgitant velocity is 2.76 m/s, and with an assumed right atrial pressure of 10 mmHg, the estimated right ventricular systolic pressure is moderately elevated at 40.4 mmHg. Left Atrium: Left atrial size was severely dilated. Right Atrium: Right atrial size was normal in size Pericardium: There is no evidence of pericardial effusion. Mitral Valve: The mitral valve is abnormal. There is mild thickening of the mitral valve leaflet(s). Mild mitral valve regurgitation. Tricuspid Valve: The tricuspid valve is normal in structure. Tricuspid valve regurgitation is moderate. Aortic Valve: The aortic valve is grossly normal. Aortic valve regurgitation is not visualized. Pulmonic Valve: The pulmonic valve was normal in structure. Pulmonic valve regurgitation is mild. Pulmonic regurgitation is mild. Aorta: The aortic root is normal in size and structure and aortic dilatation noted. There is mild dilatation of the ascending aorta. Venous: The inferior vena cava was not well visualized. IAS/Shunts: The interatrial septum was not well visualized.  LEFT VENTRICLE PLAX 2D LVIDd:         5.80 cm LVIDs:         4.40 cm LV PW:         1.00 cm LV IVS:        1.00 cm LVOT diam:     2.30  cm LV SV:         79 ml LV SV Index:   37.01 LVOT Area:     4.15 cm  RIGHT VENTRICLE RV S prime:     8.16 cm/s TAPSE (M-mode): 1.1 cm LEFT ATRIUM             Index       RIGHT ATRIUM           Index LA diam:        3.70 cm  1.75 cm/m  RA Area:     19.10 cm LA Vol (A2C):   87.4 ml 41.24 ml/m RA Volume:   47.80 ml  22.55 ml/m LA Vol (A4C):   89.3 ml 42.14 ml/m LA Biplane Vol: 95.7 ml 45.16 ml/m  AORTIC VALVE LVOT Vmax:   66.60 cm/s LVOT Vmean:  49.500 cm/s LVOT VTI:    0.130 m  AORTA Ao Root diam: 4.20 cm MITRAL VALVE                       TRICUSPID VALVE MV Area (PHT): 3.85 cm            TR Peak grad:   30.4 mmHg MV PHT:        57.13 msec          TR Vmax:        285.00 cm/s MV Decel Time: 197 msec MV E velocity: 86.10 cm/s 103 cm/s SHUNTS                                    Systemic VTI:  0.13 m                                    Systemic Diam: 2.30 cm  Dietrich Pates MD Electronically signed by Dietrich Pates MD Signature Date/Time: 06/03/2019/11:41:21 AM    Final     EKG: Independently reviewed.  Afib with rate 113; nonspecific ST changes with no evidence of acute ischemia   Labs on Admission: I have personally reviewed the available labs and imaging studies at the time of the admission.  Pertinent labs:   Glucose 120 BUN 24/Creatinine 1.41/GFR 51 BNP 509.4 HS troponin 38, 25 Normal CBC Normal TSH COVID negative HIV negative   Assessment/Plan Principal Problem:   Atrial fibrillation (HCC) Active Problems:   Sleep apnea   Acute systolic CHF (congestive heart failure) (HCC)   Alcohol abuse   Atrial Fibrillation -Patient presenting with new-onset afib.  -He is a physically fit patient who exercises regularly but has had decreased exercise tolerance and increased WOB over several months. -Etiology is unknown.  -Since the afib onset is unknown, will focus on rate control and searching for the underlying cause at this time. -Will admit to SDU for Diltiazem drip as per protocol with plan to transition to PO Diltiazem once heart rate is controlled; patient needs admission based on unstable vital signs and/or afib associated with a high-risk situation (new-onset CHF - see below). -HS troponin mildly elevated  with negative delta; low current suspicion for ACS although ischemic heart disease could have led to current presentation. -Will start empiric anticoagulation with Eliquis for now - duration of treatment is not currently known and will be based on subsequent determination of CHA2DS2-VASc Score (he had no known RF when  he presented). -Echocardiogram performed for further evaluation - see below -Will risk stratify with FLP and HgbA1c; will also order TSH and UDS.  -Repeat EKG in AM  -Will consult cardiology  -Heart rate is well controlled on Dilt at this time.  New-onset Systolic CHF -Patient without known medical problems (other than OSA) presenting with new-onset afib -Echo performed and shows markedly depressed EF, 30-35% -At this time, it is not clear if the afib is the cause of his CHF or if Takatsubo or ischemic heart disease may have caused CHF which precipitated the afib -CXR consistent with mild pulmonary edema -Elevated BNP without known baseline -With elevated BNP and abnl CXR, acute decompensated CHF seems probable as diagnosis -Patient meets NYHA Class 3 functional classification at this time (marked limitation in activity due to symptoms, being comfortable only at rest).   -He is likely to benefit from ACE-I and BB therapy if his BP will tolerate -He reports that his resting HR is in the 50s and so he may be unable to tolerate BB; additionally, given his overall excellent baseline functional status, I would be concerned that he will not tolerate the side effects of beta blocker therapy (including ED)  -Will defer medication considerations to cardiology at this time -CHF order set utilized -Was given Lasix 40 mg x 1 in ER and will repeat with 20 mg IV BID -Continue Madrone O2 for now as needed -Stage 3a CKD currently if chronic but he has no known RF (no baseline available); instead this is more likely AKI associated with decreased renal perfusion in the setting of afib/CHF.  Will repeat  BMP in AM -Mildly elevated HS troponin is likely related to demand ischemia; doubt ACS based on symptoms  OSA -Continue CPAP  ETOH abuse -Patient with reported 2-3 glasses of wine nightly -He is quite agitated in the ER at this time -Denies having issues with ETOH -Will place empirically on CIWA -Encourage ongoing discussion as an outpatient     Note: This patient has been tested and is negative for the novel coronavirus COVID-19.    DVT prophylaxis: Eliquis Code Status:  Full - confirmed with patient Family Communication: None present Disposition Plan:  Home once clinically improved Consults called: Cardiology; TOC team  Admission status: Admit - It is my clinical opinion that admission to INPATIENT is reasonable and necessary because this patient will require at least 2 midnights in the hospital to treat this condition based on the medical complexity of the problems presented.  Given the aforementioned information, the predictability of an adverse outcome is felt to be significant.   Jonah Blue MD [facility]   How to contact the [facility] Attending or Consulting provider 7A - 7P or covering provider during after hours 7P -7A, for this patient?  1. Check the care team in [facility] and look for a) attending/consulting TR[facility] provider listed and b) the [facility] team listed 2. Log into [email or web address] and use [facility] universal password to access. If you do not have the password, please contact the hospital operator. 3. Locate the [facility] provider you are looking for under [facility] and page to a number that you can be directly reached. 4. If you still have difficulty reaching the provider, please page the [facility] (Director on Call) for the Hospitalists listed on amion for assistance.   06/03/2019, 5:24 PM

## 2019-06-04 LAB — PROTIME-INR
INR: 1.2 (ref 0.8–1.2)
Prothrombin Time: 15.1 seconds (ref 11.4–15.2)

## 2019-06-04 LAB — CBC WITH DIFFERENTIAL/PLATELET
Abs Immature Granulocytes: 0.03 10*3/uL (ref 0.00–0.07)
Basophils Absolute: 0.1 10*3/uL (ref 0.0–0.1)
Basophils Relative: 1 %
Eosinophils Absolute: 0.2 10*3/uL (ref 0.0–0.5)
Eosinophils Relative: 2 %
HCT: 45.2 % (ref 39.0–52.0)
Hemoglobin: 15 g/dL (ref 13.0–17.0)
Immature Granulocytes: 0 %
Lymphocytes Relative: 24 %
Lymphs Abs: 1.9 10*3/uL (ref 0.7–4.0)
MCH: 33.1 pg (ref 26.0–34.0)
MCHC: 33.2 g/dL (ref 30.0–36.0)
MCV: 99.8 fL (ref 80.0–100.0)
Monocytes Absolute: 0.8 10*3/uL (ref 0.1–1.0)
Monocytes Relative: 10 %
Neutro Abs: 4.9 10*3/uL (ref 1.7–7.7)
Neutrophils Relative %: 63 %
Platelets: 166 10*3/uL (ref 150–400)
RBC: 4.53 MIL/uL (ref 4.22–5.81)
RDW: 12.3 % (ref 11.5–15.5)
WBC: 7.8 10*3/uL (ref 4.0–10.5)
nRBC: 0 % (ref 0.0–0.2)

## 2019-06-04 LAB — BASIC METABOLIC PANEL
Anion gap: 10 (ref 5–15)
BUN: 22 mg/dL (ref 8–23)
CO2: 26 mmol/L (ref 22–32)
Calcium: 9.6 mg/dL (ref 8.9–10.3)
Chloride: 105 mmol/L (ref 98–111)
Creatinine, Ser: 1.31 mg/dL — ABNORMAL HIGH (ref 0.61–1.24)
GFR calc Af Amer: >60 mL/min (ref >60)
GFR calc non Af Amer: 56 mL/min — ABNORMAL LOW (ref >60)
Glucose, Bld: 117 mg/dL — ABNORMAL HIGH (ref 70–99)
Potassium: 3.5 mmol/L (ref 3.5–5.1)
Sodium: 141 mmol/L (ref 135–145)

## 2019-06-04 LAB — LIPID PANEL
Cholesterol: 164 mg/dL (ref 0–200)
HDL: 33 mg/dL — ABNORMAL LOW (ref >40)
LDL Cholesterol: 112 mg/dL — ABNORMAL HIGH (ref 0–99)
Total CHOL/HDL Ratio: 5 RATIO
Triglycerides: 94 mg/dL (ref <150)
VLDL: 19 mg/dL (ref 0–40)

## 2019-06-04 LAB — HEMOGLOBIN A1C
Hgb A1c MFr Bld: 5.8 % — ABNORMAL HIGH (ref 4.8–5.6)
Mean Plasma Glucose: 119.76 mg/dL

## 2019-06-04 MED ORDER — METOPROLOL SUCCINATE ER 50 MG PO TB24
50.0000 mg | ORAL_TABLET | Freq: Every day | ORAL | Status: DC
  Administered 2019-06-05: 50 mg via ORAL
  Filled 2019-06-04: qty 1

## 2019-06-04 MED ORDER — SODIUM CHLORIDE 0.9 % IV SOLN: INTRAVENOUS | Status: DC

## 2019-06-04 MED ORDER — ATORVASTATIN CALCIUM 40 MG PO TABS
40.0000 mg | ORAL_TABLET | Freq: Every day | ORAL | Status: DC
  Administered 2019-06-04: 40 mg via ORAL
  Filled 2019-06-04: qty 1

## 2019-06-04 MED ORDER — HYDRALAZINE HCL 20 MG/ML IJ SOLN
10.0000 mg | Freq: Once | INTRAMUSCULAR | Status: DC
  Filled 2019-06-04: qty 1

## 2019-06-04 NOTE — Progress Notes (Signed)
Patient's home unit at bedside, patient self manages and was wearing.

## 2019-06-04 NOTE — H&P (View-Only) (Signed)
Progress Note   Subjective   Doing well today, the patient denies CP or SOB.  Unaware of his afib.  No new concerns  Inpatient Medications    Scheduled Meds: . apixaban  5 mg Oral BID  . atorvastatin  40 mg Oral q1800  . folic acid  1 mg Oral Daily  . furosemide  20 mg Intravenous Q12H  . hydrALAZINE  10 mg Intravenous Once  . metoprolol succinate  25 mg Oral Daily  . multivitamin with minerals  1 tablet Oral Daily  . sodium chloride flush  3 mL Intravenous Q12H  . thiamine  100 mg Oral Daily   Or  . thiamine  100 mg Intravenous Daily   Continuous Infusions: . sodium chloride     PRN Meds: sodium chloride, acetaminophen, LORazepam **OR** LORazepam, ondansetron (ZOFRAN) IV, sodium chloride flush, zolpidem   Vital Signs    Vitals:   06/03/19 2008 06/03/19 2349 06/04/19 0501 06/04/19 0740  BP: (!) 123/97 (!) 147/108 (!) 119/103 130/83  Pulse: 88 (!) 57 (!) 58 (!) 106  Resp: 18 16 16 16   Temp: 97.7 F (36.5 C) 97.6 F (36.4 C) 98.1 F (36.7 C) 98.1 F (36.7 C)  TempSrc: Oral Oral Oral Oral  SpO2: 97% 97% 98% 96%  Weight:   86.1 kg   Height:        Intake/Output Summary (Last 24 hours) at 06/04/2019 1136 Last data filed at 06/04/2019 0900 Gross per 24 hour  Intake 539.78 ml  Output --  Net 539.78 ml   Filed Weights   06/03/19 0534 06/03/19 1236 06/04/19 0501  Weight: 87.5 kg 88 kg 86.1 kg    Telemetry    afib with elevated V rates frequently - Personally Reviewed  Physical Exam   GEN- The patient is well appearing, alert and oriented x 3 today.   Head- normocephalic, atraumatic Eyes-  Sclera clear, conjunctiva pink Ears- hearing intact Oropharynx- clear Neck- supple, Lungs- normal work of breathing Heart- irregular rate and rhythm  GI-   Extremities- no clubbing, cyanosis, or edema  MS- no significant deformity or atrophy Skin- no rash or lesion Psych- euthymic mood, full affect Neuro- strength and sensation are intact   Labs     Chemistry Recent Labs  Lab 06/03/19 0550 06/04/19 0215  NA 139 141  K 4.0 3.5  CL 106 105  CO2 23 26  GLUCOSE 120* 117*  BUN 24* 22  CREATININE 1.41* 1.31*  CALCIUM 9.5 9.6  GFRNONAA 51* 56*  GFRAA 59* >60  ANIONGAP 10 10     Hematology Recent Labs  Lab 06/03/19 0550 06/04/19 0215  WBC 8.9 7.8  RBC 4.88 4.53  HGB 16.3 15.0  HCT 50.1 45.2  MCV 102.7* 99.8  MCH 33.4 33.1  MCHC 32.5 33.2  RDW 12.3 12.3  PLT 152 166    Cardiac EnzymesNo results for input(s): TROPONINI in the last 168 hours. No results for input(s): TROPIPOC in the last 168 hours.      Assessment & Plan    1.  Persistent atrial fibrillation The patient has minimally symptomatic afib.  V rates are elevated.  He has left atrial enlargement by echo.  He also has OSA and h/o ETOH.  I suspect that this is not his first episode of afib and that he has likely had atrial remodeling already. Given ongoing elevation in V rates, will plan TEE guided cardioversion tomorrow. Increase toprol to 50mg  daily Orders have been placed for cardioversion.  Given persistent afib and atrial remodeling, I would advise consideration of tikosyn should his afib return prior to ablation.  2. Acute systolic dysfunction New diagnosis Likely tachycardia mediated Metoprolol started Reassess EF after 2 months of sinus rhythm.  If still reduced at that time, I would advise further ischemic evaluation.  3. OSA Compliance with therapy will be required long term  4. ETOH Avoidance is encouraged  Dr Klein to see in AM Hopefully to discharge after cardioversion tomorrow Will need follow-up in 3-5 days in the AF clinic  Tniya Bowditch MD, FACC 06/04/2019 11:36 AM  

## 2019-06-04 NOTE — Progress Notes (Signed)
PROGRESS NOTE    Steven Gray  GHW:[id number] DOB: [birth year] DOA: 06/03/2019 PCP: Geoffry Paradise, MD   Brief Narrative:  Steven Gray is a 69 y.o. male with medical history significant of OSA: and HLD presented with progressive SOB mostly exertional but sometimes at rest. he has been working out hard.  He had a little sniffle yesterday and went for a COVID test, not really due to the SOB.  No known COVID contacts.  No chest pain.  Ruled out of COVID-19 negative in the emergency department.  Subsequently was found to have new onset atrial fibrillation with RVR.  Given Lovenox and Cardizem.  Admitted under hospitalist service.  Cardiology consulted.  Assessment & Plan:   Principal Problem:   Atrial fibrillation (HCC) Active Problems:   Sleep apnea   Acute systolic CHF (congestive heart failure) (HCC)   Alcohol abuse  New onset atrial fibrillation with RVR: Rates remain controlled.  Cardiology on board.  They started him on Eliquis and Toprol-XL.  Plan for TEE cardioversion tomorrow.  Appreciate cardiology help and defer further management to them.  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  Hope this will improve after he comes back to regular rhythm after TEE cardioversion.  Remains on IV Lasix 20 mg twice daily.  Management per cardiology.  OSA: Continue CPAP  EtOH abuse: Reportedly, he drinks 2 to 3 glasses of wine nightly.  No signs of withdrawal.  Continue CIWA protocol.  ED stage IIIa: Came in with GFR 51 which is improved to 56.  No prior records but based on the stability, this is likely CKD.  Monitor daily.  Essential hypertension: Was not on any antihypertensives previously.  Blood pressure now remained stable.  Continue Toprol-XL and monitor.  Hyperlipidemia: Low HDL and high LDL at 112.  Will start on atorvastatin 40 mg p.o. daily.  DVT prophylaxis: Eliquis  code Status: Full  code Family Communication:  None present at bedside.  Plan of care discussed with patient in length and he verbalized understanding and agreed with it. Disposition Plan: Potential discharge home tomorrow after cardioversion if cleared by cardiology.  Estimated body mass index is 25.04 kg/m as calculated from the following:   Height as of this encounter: 6\' 1"  (1.854 m).   Weight as of this encounter: 86.1 kg.      Nutritional status:               Consultants:   Cardiology  Procedures:   None  Antimicrobials:   None   Subjective: Seen and examined.  No chest pain or palpitation.  Some shortness of breath with walking but improved compared to yesterday.  No other complaint.  Objective: Vitals:   06/03/19 2008 06/03/19 2349 06/04/19 0501 06/04/19 0740  BP: (!) 123/97 (!) 147/108 (!) 119/103 130/83  Pulse: 88 (!) 57 (!) 58 (!) 106  Resp: 18 16 16 16   Temp: 97.7 F (36.5 C) 97.6 F (36.4 C) 98.1 F (36.7 C) 98.1 F (36.7 C)  TempSrc: Oral Oral Oral Oral  SpO2: 97% 97% 98% 96%  Weight:   86.1 kg   Height:        Intake/Output Summary (Last 24 hours) at 06/04/2019 1125 Last data filed at 06/04/2019 0900 Gross per 24 hour  Intake 539.78 ml  Output --  Net 539.78 ml   Filed Weights   06/03/19 0534 06/03/19 1236 06/04/19 0501  Weight: 87.5 kg 88 kg 86.1 kg  Examination:  General exam: Appears calm and comfortable  Respiratory system: Clear to auscultation. Respiratory effort normal. Cardiovascular system: S1 & S2 heard, irregularly irregular rate and rhythm. No JVD, murmurs, rubs, gallops or clicks. No pedal edema. Gastrointestinal system: Abdomen is nondistended, soft and nontender. No organomegaly or masses felt. Normal bowel sounds heard. Central nervous system: Alert and oriented. No focal neurological deficits. Extremities: Symmetric 5 x 5 power. Skin: No rashes, lesions or ulcers Psychiatry: Judgement and insight appear normal. Mood & affect  appropriate.    Data Reviewed: I have personally reviewed following labs and imaging studies  CBC: Recent Labs  Lab 06/03/19 0550 06/04/19 0215  WBC 8.9 7.8  NEUTROABS 6.4 4.9  HGB 16.3 15.0  HCT 50.1 45.2  MCV 102.7* 99.8  PLT 152 166   Basic Metabolic Panel: Recent Labs  Lab 06/03/19 0550 06/04/19 0215  NA 139 141  K 4.0 3.5  CL 106 105  CO2 23 26  GLUCOSE 120* 117*  BUN 24* 22  CREATININE 1.41* 1.31*  CALCIUM 9.5 9.6   GFR: Estimated Creatinine Clearance: 61.8 mL/min (A) (by C-G formula based on SCr of 1.31 mg/dL (H)). Liver Function Tests: No results for input(s): AST, ALT, ALKPHOS, BILITOT, PROT, ALBUMIN in the last 168 hours. No results for input(s): LIPASE, AMYLASE in the last 168 hours. No results for input(s): AMMONIA in the last 168 hours. Coagulation Profile: No results for input(s): INR, PROTIME in the last 168 hours. Cardiac Enzymes: No results for input(s): CKTOTAL, CKMB, CKMBINDEX, TROPONINI in the last 168 hours. BNP (last 3 results) No results for input(s): PROBNP in the last 8760 hours. HbA1C: Recent Labs    06/04/19 0215  HGBA1C 5.8*   CBG: No results for input(s): GLUCAP in the last 168 hours. Lipid Profile: Recent Labs    06/04/19 0215  CHOL 164  HDL 33*  LDLCALC 112*  TRIG 94  CHOLHDL 5.0   Thyroid Function Tests: Recent Labs    06/03/19 1021  TSH 0.737   Anemia Panel: No results for input(s): VITAMINB12, FOLATE, FERRITIN, TIBC, IRON, RETICCTPCT in the last 72 hours. Sepsis Labs: No results for input(s): PROCALCITON, LATICACIDVEN in the last 168 hours.  Recent Results (from the past 240 hour(s))  Novel Coronavirus, NAA ([facility])     Status: None   Collection Time: 06/02/19 11:40 AM   Specimen: Nasopharyngeal(NP) swabs in vial transport medium   NASOPHARYNGE  TESTING  Result Value Ref Range Status   SARS-CoV-2, NAA Not Detected Not Detected Final    Comment: This nucleic acid amplification test was developed and its  performance characteristics determined by [employer]. Nucleic acid amplification tests include RT-PCR and TMA. This test has not been FDA cleared or approved. This test has been authorized by FDA under an Emergency Use Authorization (EUA). This test is only authorized for the duration of time the declaration that circumstances exist justifying the authorization of the emergency use of in vitro diagnostic tests for detection of SARS-CoV-2 virus and/or diagnosis of COVID-19 infection under section 564(b)(1) of the Act, 21 U.S.C. 956LOV-5(I) (1), unless the authorization is terminated or revoked sooner. When diagnostic testing is negative, the possibility of a false negative result should be considered in the context of a patient's recent exposures and the presence of clinical signs and symptoms consistent with COVID-19. An individual without symptoms of COVID-19 and who is not shedding SARS-CoV-2 virus wo uld expect to have a negative (not detected) result in this assay.   SARS CORONAVIRUS  2 (TAT 6-24 HRS) Nasopharyngeal Nasopharyngeal Swab     Status: None   Collection Time: 06/03/19  6:29 AM   Specimen: Nasopharyngeal Swab  Result Value Ref Range Status   SARS Coronavirus 2 NEGATIVE NEGATIVE Final    Comment: (NOTE) SARS-CoV-2 target nucleic acids are NOT DETECTED. The SARS-CoV-2 RNA is generally detectable in upper and lower respiratory specimens during the acute phase of infection. Negative results do not preclude SARS-CoV-2 infection, do not rule out co-infections with other pathogens, and should not be used as the sole basis for treatment or other patient management decisions. Negative results must be combined with clinical observations, patient history, and epidemiological information. The expected result is Negative. Fact Sheet for Patients: HairSlick.no Fact Sheet for Healthcare  Providers: quierodirigir.com This test is not yet approved or cleared by the [place] FDA and  has been authorized for detection and/or diagnosis of SARS-CoV-2 by FDA under an Emergency Use Authorization (EUA). This EUA will remain  in effect (meaning this test can be used) for the duration of the COVID-19 declaration under Section 56 4(b)(1) of the Act, 21 U.S.C. section 360bbb-3(b)(1), unless the authorization is terminated or revoked sooner. Performed at [facility], [address] [place], [place] [address]       Radiology Studies: DG Chest Portable 1 View  Result Date: 06/03/2019 CLINICAL DATA:  Patient with progressive shortness of breath for 2 months. Worse when lying down. EXAM: PORTABLE CHEST 1 VIEW COMPARISON:  None. FINDINGS: Cardiac contours upper limits of normal. Mild elevation of the left hemidiaphragm. Mild bilateral interstitial pulmonary opacities. No pleural effusion or pneumothorax. Thoracic spine degenerative changes. IMPRESSION: Cardiac contours upper limits of normal. Mild bilateral interstitial opacities which may represent mild interstitial edema or atypical infectious process. Electronically Signed   By: Annia Belt M.D.   On: 06/03/2019 06:05   ECHOCARDIOGRAM COMPLETE  Result Date: 06/03/2019   ECHOCARDIOGRAM REPORT   Patient Name:   Steven Gray [facility] Date of Exam: 06/03/2019 Medical Rec #:  [id number]         Height:       73.0 in Accession #:    [id number]        Weight:       193.0 lb Date of Birth:  20-Aug-1950         BSA:          2.12 m Patient Age:    68 years          BP:           128/110 mmHg Patient Gender: M                 HR:           93 bpm. Exam Location:  Inpatient Procedure: 2D Echo Indications:    dyspnea  History:        Patient has no prior history of Echocardiogram examinations.                 Risk Factors:Dyslipidemia.  Sonographer:    Celene Skeen RDCS (AE) Referring Phys: [phone] HAO MENG IMPRESSIONS  1.  Left ventricular ejection fraction, by visual estimation, is 30 to 35%. The left ventricle has moderate to severely decreased function. There is no left ventricular hypertrophy.  2. Left ventricular diastolic parameters are indeterminate.  3. The left ventricle demonstrates global hypokinesis.  4. Atrial fibrillation makes accurate assessment of LVEF diffciult.  5. Global right ventricle has moderately reduced systolic function.The  right ventricular size is normal. No increase in right ventricular wall thickness.  6. Left atrial size was severely dilated.  7. Right atrial size was normal.  8. The mitral valve is abnormal. Mild mitral valve regurgitation.  9. The tricuspid valve is normal in structure. 10. The tricuspid valve is normal in structure. Tricuspid valve regurgitation is moderate. 11. The aortic valve is grossly normal. Aortic valve regurgitation is not visualized. 12. Pulmonic regurgitation is mild. 13. The pulmonic valve was normal in structure. Pulmonic valve regurgitation is mild. 14. Aortic dilatation noted. 15. There is mild dilatation of the ascending aorta. 16. Moderately elevated pulmonary artery systolic pressure. 17. The interatrial septum was not well visualized. FINDINGS  Left Ventricle: Left ventricular ejection fraction, by visual estimation, is 30 to 35%. The left ventricle has moderate to severely decreased function. The left ventricle demonstrates global hypokinesis. There is no left ventricular hypertrophy. Left ventricular diastolic parameters are indeterminate. Atrial fibrillation makes accurate assessment of LVEF diffciult. Right Ventricle: The right ventricular size is normal. No increase in right ventricular wall thickness. Global RV systolic function is has moderately reduced systolic function. The tricuspid regurgitant velocity is 2.76 m/s, and with an assumed right atrial pressure of 10 mmHg, the estimated right ventricular systolic pressure is moderately elevated at 40.4 mmHg.  Left Atrium: Left atrial size was severely dilated. Right Atrium: Right atrial size was normal in size Pericardium: There is no evidence of pericardial effusion. Mitral Valve: The mitral valve is abnormal. There is mild thickening of the mitral valve leaflet(s). Mild mitral valve regurgitation. Tricuspid Valve: The tricuspid valve is normal in structure. Tricuspid valve regurgitation is moderate. Aortic Valve: The aortic valve is grossly normal. Aortic valve regurgitation is not visualized. Pulmonic Valve: The pulmonic valve was normal in structure. Pulmonic valve regurgitation is mild. Pulmonic regurgitation is mild. Aorta: The aortic root is normal in size and structure and aortic dilatation noted. There is mild dilatation of the ascending aorta. Venous: The inferior vena cava was not well visualized. IAS/Shunts: The interatrial septum was not well visualized.  LEFT VENTRICLE PLAX 2D LVIDd:         5.80 cm LVIDs:         4.40 cm LV PW:         1.00 cm LV IVS:        1.00 cm LVOT diam:     2.30 cm LV SV:         79 ml LV SV Index:   37.01 LVOT Area:     4.15 cm  RIGHT VENTRICLE RV S prime:     8.16 cm/s TAPSE (M-mode): 1.1 cm LEFT ATRIUM             Index       RIGHT ATRIUM           Index LA diam:        3.70 cm 1.75 cm/m  RA Area:     19.10 cm LA Vol (A2C):   87.4 ml 41.24 ml/m RA Volume:   47.80 ml  22.55 ml/m LA Vol (A4C):   89.3 ml 42.14 ml/m LA Biplane Vol: 95.7 ml 45.16 ml/m  AORTIC VALVE LVOT Vmax:   66.60 cm/s LVOT Vmean:  49.500 cm/s LVOT VTI:    0.130 m  AORTA Ao Root diam: 4.20 cm MITRAL VALVE                       TRICUSPID VALVE MV Area (  PHT): 3.85 cm            TR Peak grad:   30.4 mmHg MV PHT:        57.13 msec          TR Vmax:        285.00 cm/s MV Decel Time: 197 msec MV E velocity: 86.10 cm/s 103 cm/s SHUNTS                                    Systemic VTI:  0.13 m                                    Systemic Diam: 2.30 cm  Dorris Carnes MD Electronically signed by Dorris Carnes MD Signature  Date/Time: 06/03/2019/11:41:21 AM    Final     Scheduled Meds: . apixaban  5 mg Oral BID  . folic acid  1 mg Oral Daily  . furosemide  20 mg Intravenous Q12H  . hydrALAZINE  10 mg Intravenous Once  . metoprolol succinate  25 mg Oral Daily  . multivitamin with minerals  1 tablet Oral Daily  . sodium chloride flush  3 mL Intravenous Q12H  . thiamine  100 mg Oral Daily   Or  . thiamine  100 mg Intravenous Daily   Continuous Infusions: . sodium chloride       LOS: 1 day   Time spent: 33 minutes   Darliss Cheney, MD [facility]  06/04/2019, 11:25 AM   To contact the attending provider between 7A-7P or the covering provider during after hours 7P-7A, please log into the web site [email or web address].

## 2019-06-04 NOTE — Progress Notes (Signed)
Progress Note   Subjective   Doing well today, the patient denies CP or SOB.  Unaware of his afib.  No new concerns  Inpatient Medications    Scheduled Meds: . apixaban  5 mg Oral BID  . atorvastatin  40 mg Oral q1800  . folic acid  1 mg Oral Daily  . furosemide  20 mg Intravenous Q12H  . hydrALAZINE  10 mg Intravenous Once  . metoprolol succinate  25 mg Oral Daily  . multivitamin with minerals  1 tablet Oral Daily  . sodium chloride flush  3 mL Intravenous Q12H  . thiamine  100 mg Oral Daily   Or  . thiamine  100 mg Intravenous Daily   Continuous Infusions: . sodium chloride     PRN Meds: sodium chloride, acetaminophen, LORazepam **OR** LORazepam, ondansetron (ZOFRAN) IV, sodium chloride flush, zolpidem   Vital Signs    Vitals:   06/03/19 2008 06/03/19 2349 06/04/19 0501 06/04/19 0740  BP: (!) 123/97 (!) 147/108 (!) 119/103 130/83  Pulse: 88 (!) 57 (!) 58 (!) 106  Resp: 18 16 16 16   Temp: 97.7 F (36.5 C) 97.6 F (36.4 C) 98.1 F (36.7 C) 98.1 F (36.7 C)  TempSrc: Oral Oral Oral Oral  SpO2: 97% 97% 98% 96%  Weight:   86.1 kg   Height:        Intake/Output Summary (Last 24 hours) at 06/04/2019 1136 Last data filed at 06/04/2019 0900 Gross per 24 hour  Intake 539.78 ml  Output --  Net 539.78 ml   Filed Weights   06/03/19 0534 06/03/19 1236 06/04/19 0501  Weight: 87.5 kg 88 kg 86.1 kg    Telemetry    afib with elevated V rates frequently - Personally Reviewed  Physical Exam   GEN- The patient is well appearing, alert and oriented x 3 today.   Head- normocephalic, atraumatic Eyes-  Sclera clear, conjunctiva pink Ears- hearing intact Oropharynx- clear Neck- supple, Lungs- normal work of breathing Heart- irregular rate and rhythm  GI-   Extremities- no clubbing, cyanosis, or edema  MS- no significant deformity or atrophy Skin- no rash or lesion Psych- euthymic mood, full affect Neuro- strength and sensation are intact   Labs     Chemistry Recent Labs  Lab 06/03/19 0550 06/04/19 0215  NA 139 141  K 4.0 3.5  CL 106 105  CO2 23 26  GLUCOSE 120* 117*  BUN 24* 22  CREATININE 1.41* 1.31*  CALCIUM 9.5 9.6  GFRNONAA 51* 56*  GFRAA 59* >60  ANIONGAP 10 10     Hematology Recent Labs  Lab 06/03/19 0550 06/04/19 0215  WBC 8.9 7.8  RBC 4.88 4.53  HGB 16.3 15.0  HCT 50.1 45.2  MCV 102.7* 99.8  MCH 33.4 33.1  MCHC 32.5 33.2  RDW 12.3 12.3  PLT 152 166    Cardiac EnzymesNo results for input(s): TROPONINI in the last 168 hours. No results for input(s): TROPIPOC in the last 168 hours.      Assessment & Plan    1.  Persistent atrial fibrillation The patient has minimally symptomatic afib.  V rates are elevated.  He has left atrial enlargement by echo.  He also has OSA and h/o ETOH.  I suspect that this is not his first episode of afib and that he has likely had atrial remodeling already. Given ongoing elevation in V rates, will plan TEE guided cardioversion tomorrow. Increase toprol to 50mg  daily Orders have been placed for cardioversion.  Given persistent afib and atrial remodeling, I would advise consideration of tikosyn should his afib return prior to ablation.  2. Acute systolic dysfunction New diagnosis Likely tachycardia mediated Metoprolol started Reassess EF after 2 months of sinus rhythm.  If still reduced at that time, I would advise further ischemic evaluation.  3. OSA Compliance with therapy will be required long term  4. ETOH Avoidance is encouraged  Dr Caryl Comes to see in AM Hopefully to discharge after cardioversion tomorrow Will need follow-up in 3-5 days in the AF clinic  Thompson Grayer MD, [facility] 06/04/2019 11:36 AM

## 2019-06-05 ENCOUNTER — Telehealth: Payer: Self-pay | Admitting: Physician Assistant

## 2019-06-05 ENCOUNTER — Inpatient Hospital Stay (HOSPITAL_COMMUNITY): Payer: Commercial Managed Care - PPO | Admitting: Anesthesiology

## 2019-06-05 ENCOUNTER — Encounter (HOSPITAL_COMMUNITY)
Admission: EM | Disposition: A | Discharge status: Home / Self Care | Payer: Self-pay | Source: Home / Self Care | Attending: Family Medicine

## 2019-06-05 ENCOUNTER — Other Ambulatory Visit: Payer: Self-pay | Admitting: *Deleted

## 2019-06-05 ENCOUNTER — Other Ambulatory Visit: Payer: Self-pay

## 2019-06-05 ENCOUNTER — Inpatient Hospital Stay (HOSPITAL_COMMUNITY): Payer: Commercial Managed Care - PPO

## 2019-06-05 DIAGNOSIS — I340 Nonrheumatic mitral (valve) insufficiency: Secondary | ICD-10-CM

## 2019-06-05 DIAGNOSIS — E785 Hyperlipidemia, unspecified: Secondary | ICD-10-CM

## 2019-06-05 DIAGNOSIS — I371 Nonrheumatic pulmonary valve insufficiency: Secondary | ICD-10-CM

## 2019-06-05 DIAGNOSIS — I513 Intracardiac thrombosis, not elsewhere classified: Secondary | ICD-10-CM

## 2019-06-05 DIAGNOSIS — I4891 Unspecified atrial fibrillation: Secondary | ICD-10-CM

## 2019-06-05 HISTORY — PX: TEE WITHOUT CARDIOVERSION: SHX5443

## 2019-06-05 LAB — BASIC METABOLIC PANEL
Anion gap: 12 (ref 5–15)
BUN: 22 mg/dL (ref 8–23)
CO2: 27 mmol/L (ref 22–32)
Calcium: 9.7 mg/dL (ref 8.9–10.3)
Chloride: 101 mmol/L (ref 98–111)
Creatinine, Ser: 1.34 mg/dL — ABNORMAL HIGH (ref 0.61–1.24)
GFR calc Af Amer: >60 mL/min (ref >60)
GFR calc non Af Amer: 54 mL/min — ABNORMAL LOW (ref >60)
Glucose, Bld: 104 mg/dL — ABNORMAL HIGH (ref 70–99)
Potassium: 3.5 mmol/L (ref 3.5–5.1)
Sodium: 140 mmol/L (ref 135–145)

## 2019-06-05 SURGERY — ECHOCARDIOGRAM, TRANSESOPHAGEAL
Anesthesia: Monitor Anesthesia Care

## 2019-06-05 MED ORDER — LOSARTAN POTASSIUM 25 MG PO TABS: 25.0000 mg | ORAL_TABLET | Freq: Every day | ORAL | 0 refills | Status: DC

## 2019-06-05 MED ORDER — ATORVASTATIN CALCIUM 40 MG PO TABS: 40.0000 mg | ORAL_TABLET | Freq: Every day | ORAL | 0 refills | Status: DC

## 2019-06-05 MED ORDER — METOPROLOL SUCCINATE ER 50 MG PO TB24: 50.0000 mg | ORAL_TABLET | Freq: Every day | ORAL | 0 refills | Status: DC

## 2019-06-05 MED ORDER — DIGOXIN 125 MCG PO TABS
0.1250 mg | ORAL_TABLET | Freq: Every day | ORAL | Status: DC
  Administered 2019-06-05: 0.125 mg via ORAL
  Filled 2019-06-05: qty 1

## 2019-06-05 MED ORDER — LACTATED RINGERS IV SOLN
INTRAVENOUS | Status: AC | PRN
  Administered 2019-06-05: 1000 mL via INTRAVENOUS

## 2019-06-05 MED ORDER — DIGOXIN 125 MCG PO TABS: 0.1250 mg | ORAL_TABLET | Freq: Every day | ORAL | 0 refills | Status: DC

## 2019-06-05 MED ORDER — LOSARTAN POTASSIUM 25 MG PO TABS: 25.0000 mg | ORAL_TABLET | Freq: Every day | ORAL | Status: DC

## 2019-06-05 MED ORDER — PROPOFOL 500 MG/50ML IV EMUL
INTRAVENOUS | Status: DC | PRN
  Administered 2019-06-05: 150 ug/kg/min via INTRAVENOUS

## 2019-06-05 MED ORDER — BUTAMBEN-TETRACAINE-BENZOCAINE 2-2-14 % EX AERO
INHALATION_SPRAY | CUTANEOUS | Status: DC | PRN
  Administered 2019-06-05: 2 via TOPICAL

## 2019-06-05 MED ORDER — FUROSEMIDE 20 MG PO TABS: 20.0000 mg | ORAL_TABLET | Freq: Every day | ORAL | 3 refills | Status: DC

## 2019-06-05 MED ORDER — APIXABAN 5 MG PO TABS: 5.0000 mg | ORAL_TABLET | Freq: Two times a day (BID) | ORAL | 0 refills | Status: DC

## 2019-06-05 MED ORDER — LACTATED RINGERS IV SOLN: INTRAVENOUS | Status: DC | PRN

## 2019-06-05 MED ORDER — LIDOCAINE 2% (20 MG/ML) 5 ML SYRINGE
INTRAMUSCULAR | Status: DC | PRN
  Administered 2019-06-05: 40 mg via INTRAVENOUS

## 2019-06-05 MED FILL — METOPROLOL SUCCINATE ER 50: 50 | 30 days supply | Qty: 30 | Fill #0

## 2019-06-05 MED FILL — LOSARTAN POTASSIUM 25 MG TA: 25 | 30 days supply | Qty: 30 | Fill #0

## 2019-06-05 MED FILL — DIGOXIN 0.125 MG TABLET: 125 | 30 days supply | Qty: 30 | Fill #0

## 2019-06-05 MED FILL — ELIQUIS 5 MG TABLET: 5 | 30 days supply | Qty: 60 | Fill #0

## 2019-06-05 MED FILL — ATORVASTATIN CALCIUM 40 MG: 40 | 30 days supply | Qty: 30 | Fill #0

## 2019-06-05 NOTE — Anesthesia Postprocedure Evaluation (Signed)
Anesthesia Post Note  Patient: Steven Gray  Procedure(s) Performed: TRANSESOPHAGEAL ECHOCARDIOGRAM (TEE) (N/A )     Patient location during evaluation: Endoscopy Anesthesia Type: MAC Level of consciousness: awake and alert Pain management: pain level controlled Vital Signs Assessment: post-procedure vital signs reviewed and stable Respiratory status: spontaneous breathing, nonlabored ventilation and respiratory function stable Cardiovascular status: blood pressure returned to baseline and stable Postop Assessment: no apparent nausea or vomiting Anesthetic complications: no    Last Vitals:  Vitals:   06/05/19 0942 06/05/19 1035  BP: 98/63 118/87  Pulse: (!) 55 86  Resp: 20   Temp:    SpO2: 99%     Last Pain:  Vitals:   06/05/19 0942  TempSrc:   PainSc: 0-No pain   Pain Goal:                   Lidia Collum

## 2019-06-05 NOTE — TOC Progression Note (Signed)
Transition of Care [facility]) - Progression Note    Patient Details  Name: Steven Gray MRN: [id number] Date of Birth: 01-26-1951  [facility] CM/SW Contact  Leone Haven, RN Phone Number: 06/05/2019, 1:28 PM  Clinical Narrative:    NCM gave patient substance abuse resources. Patient is for dc home today.        Expected Discharge Plan and Services           Expected Discharge Date: 06/05/19                                     Social Determinants of Health (SDOH) Interventions    Readmission Risk Interventions No flowsheet data found.

## 2019-06-05 NOTE — Telephone Encounter (Signed)
Dr. Graciela Husbands decided to continue patient on lasix 20mg  daily.  He has already discharged I called and left a message on cell number that I was calling to slarify home medicines and wanted to add one.  I have asked my MA to send in lasix 20mg  daily for the patient I will follow up. , PA-C

## 2019-06-05 NOTE — CV Procedure (Signed)
TEE: Anesthesia: Propofol  Severe LAE with dense smoke and LAA thrombus. Somewhat calcified indicating chronicity Moderate RAE EF 25-30% diffuse hypokinesis Mild MR Mild TR with moderate RVE and hypokinesis No effusion No aortic debris PFO present Trivial AR  Discussed with EP PA Renee Gardiner Fanti MD [facility]

## 2019-06-05 NOTE — Progress Notes (Addendum)
Progress Note   Subjective   Feels much better, no ongoing SOB.  He has no cardiac awareness, no CP or palpitations.  Inpatient Medications    Scheduled Meds:  apixaban  5 mg Oral BID   atorvastatin  40 mg Oral [id number]   folic acid  1 mg Oral Daily   furosemide  20 mg Intravenous Q12H   hydrALAZINE  10 mg Intravenous Once   metoprolol succinate  50 mg Oral Daily   multivitamin with minerals  1 tablet Oral Daily   sodium chloride flush  3 mL Intravenous Q12H   thiamine  100 mg Oral Daily   Or   thiamine  100 mg Intravenous Daily   Continuous Infusions:  sodium chloride     PRN Meds: sodium chloride, acetaminophen, LORazepam **OR** LORazepam, ondansetron (ZOFRAN) IV, sodium chloride flush, zolpidem   Vital Signs    Vitals:   06/05/19 0924 06/05/19 0927 06/05/19 0937 06/05/19 0942  BP: 100/63  (!) 96/48 98/63  Pulse: 99 (!) 47 60 (!) 55  Resp: 20 (!) 21 (!) 25 20  Temp: (!) 97.5 F (36.4 C)     TempSrc: Temporal     SpO2: 95% 94% (!) 88% 99%  Weight:      Height:        Intake/Output Summary (Last 24 hours) at 06/05/2019 1001 Last data filed at 06/05/2019 0914 Gross per 24 hour  Intake 239.47 ml  Output --  Net 239.47 ml   Filed Weights   06/03/19 1236 06/04/19 0501 06/05/19 0817  Weight: 88 kg 86.1 kg 86.1 kg    Telemetry    AFib, 90's-110's - Personally Reviewed  Physical Exam     GEN- The patient is well appearing, alert and oriented x 3 today.   Head- normocephalic, atraumatic Eyes-  Sclera clear, conjunctiva pink Ears- hearing intact Oropharynx- clear Neck- supple, Lungs- CTA, normal WOB Heart- irreg-irreg, soft SM  GI-   Extremities- no clubbing, cyanosis, or edema  MS- no significant deformity or atrophy Skin- no rash or lesion Psych- euthymic mood, full affect Neuro- strength and sensation are intact   Labs    Chemistry Recent Labs  Lab 06/03/19 0550 06/04/19 0215 06/05/19 0225  NA 139 141 140  K 4.0 3.5 3.5  CL 106 105 101   CO2 23 26 27   GLUCOSE 120* 117* 104*  BUN 24* 22 22  CREATININE 1.41* 1.31* 1.34*  CALCIUM 9.5 9.6 9.7  GFRNONAA 51* 56* 54*  GFRAA 59* >60 >60  ANIONGAP 10 10 12      Hematology Recent Labs  Lab 06/03/19 0550 06/04/19 0215  WBC 8.9 7.8  RBC 4.88 4.53  HGB 16.3 15.0  HCT 50.1 45.2  MCV 102.7* 99.8  MCH 33.4 33.1  MCHC 32.5 33.2  RDW 12.3 12.3  PLT 152 166    Cardiac EnzymesNo results for input(s): TROPONINI in the last 168 hours. No results for input(s): TROPIPOC in the last 168 hours.      06/05/2019: TEE: Anesthesia: Propofol Severe LAE with dense smoke and LAA thrombus. Somewhat calcified indicating chronicity Moderate RAE EF 25-30% diffuse hypokinesis Mild MR Mild TR with moderate RVE and hypokinesis No effusion No aortic debris PFO present Trivial AR   Discussed with EP PA Renee Darolyn Rua MD [facility]  06/05/2019: TTE IMPRESSIONS  1. Left ventricular ejection fraction, by visual estimation, is 25 to  30%. The left ventricle has severely decreased function. There is no left  ventricular hypertrophy.  2. Severely dilated left ventricular internal cavity size.   3. The left ventricle demonstrates global hypokinesis.   4. Global right ventricle has moderately reduced systolic function.The  right ventricular size is moderately enlarged. No increase in right  ventricular wall thickness.   5. Left atrial size was severely dilated.   6. Dense smoke with LAA thrombus present Calcified indicating some  chronicity Confirmed presence on 3D imaging of appendage.   7. PFO present.   8. Right atrial size was moderately dilated.   9. The mitral valve is normal in structure. Mild mitral valve  regurgitation.  10. The tricuspid valve is normal in structure.  11. The tricuspid valve is normal in structure. Tricuspid valve  regurgitation is mild.  12. The aortic valve is tricuspid. Aortic valve regurgitation is trivial.  Mild aortic valve sclerosis without stenosis.   13. The pulmonic valve was normal in structure. Pulmonic valve  regurgitation is mild.  14. Aortic dilatation noted.  15. There is mild dilatation of the aortic root measuring 38 mm.  16. DCC not perfomed as patient had LAA thrombus.  17. Evidence of atrial level shunting detected by color flow Doppler  Assessment & Plan    1.  Persistent atrial fibrillation, new for patient      Likely has had before given TEE/echo findings      metoprolol increased yesterday, just got his dose now      TEE this AM Severe LAE with dense smoke and LAA thrombus (likely has had Afib for a while)      Continue Eliquis      Will need to rate control for now with plans to repeat TEE after 4 weeks of a/c and DCCV if able  Discussed stroke risk and importance of his Eliquis and compliance with his medicines Dr. Graciela Husbands has seen and examined the patient OK to discharge from our perspective   Please discharge on: Metoprolol Succ 50mg  daily Digoxin 0.125mg  daily Eliquis 5mg  BID Losartan 25mg  QPM  Early follow up with the Afib clinic is in place If not better rate controlled Dr. thoughts were to add on amiodarone for rate control measures if needed      2. Acute systolic dysfunction New diagnosis Likely tachycardia mediated Metoprolol started No output charted, down 4lbs Exam does not suggest ongoing volume OL Will  add small ARB dose to take at night   Dr. recommended reassess EF after 2 months of sinus rhythm.  If still reduced at that time, would advised further ischemic evaluation.   3. OSA     He reports using every night religiously    4. 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  Unfortunately his BP makes uptitration of BB challenging so may, in the absence of being able to cardiovert 2/2 LAA clot, require amio for rate control, but will start with digoxin.  If at office followup next week, his walking HR is still > 100, would amiodarone for rate control-- will need a dig level 2/2 to drug interactions  Will plan repeat TEEDCCV in about 4 weeks; there remains the debate as to whether  repeat TEE is necessary.    Will continue low dose furosemide as still with JVP  Long conversation about EtOH, not sure we have the whole story, but strongly encouraged for now abstinence, as the relationship with afib and alcohol is proportional without any J curve  Also will need home monitoring tool, recommended [employer] for accurate assessment of rate and rhythm  Add low dose losartan for his cardiomyopathy and will have him take at night

## 2019-06-05 NOTE — Transfer of Care (Signed)
Immediate Anesthesia Transfer of Care Note  Patient: Steven Gray  Procedure(s) Performed: TRANSESOPHAGEAL ECHOCARDIOGRAM (TEE) (N/A )  Patient Location: Endoscopy Unit  Anesthesia Type:MAC  Level of Consciousness: awake, alert  and oriented  Airway & Oxygen Therapy: Patient Spontanous Breathing  Post-op Assessment: Report given to RN  Post vital signs: Reviewed and stable  Last Vitals:  Vitals Value Taken Time  BP 156/132 06/05/19 0923  Temp    Pulse 48 06/05/19 0925  Resp 21 06/05/19 0925  SpO2 93 % 06/05/19 0925  Vitals shown include unvalidated device data.  Last Pain:  Vitals:   06/05/19 0817  TempSrc: Oral  PainSc: 0-No pain         Complications: No apparent anesthesia complications

## 2019-06-05 NOTE — Anesthesia Procedure Notes (Signed)
Procedure Name: MAC Date/Time: 06/05/2019 9:00 AM Performed by: Alain Marion, CRNA Pre-anesthesia Checklist: Patient identified, Emergency Drugs available, Suction available and Patient being monitored Oxygen Delivery Method: Nasal cannula Placement Confirmation: positive ETCO2

## 2019-06-05 NOTE — Interval H&P Note (Signed)
History and Physical Interval Note:  06/05/2019 8:38 AM  Steven Gray  has presented today for surgery, with the diagnosis of afib.  The various methods of treatment have been discussed with the patient and family. After consideration of risks, benefits and other options for treatment, the patient has consented to  Procedure(s): TRANSESOPHAGEAL ECHOCARDIOGRAM (TEE) (N/A) CARDIOVERSION (N/A) as a surgical intervention.  The patient's history has been reviewed, patient examined, no change in status, stable for surgery.  I have reviewed the patient's chart and labs.  Questions were answered to the patient's satisfaction.     Charlton Haws

## 2019-06-05 NOTE — Telephone Encounter (Signed)
Spoke with the patient.reviewed we will be sending in Lasix 20mg  daily to his pharmacy To start tomorrow and recommend taking in the morning He stated understanding I encouraged him to call if any questions  , PA-C

## 2019-06-05 NOTE — Discharge Summary (Signed)
Physician Discharge Summary  Steven Gray QGB:[id number] DOB: 12-18-1950 DOA: 06/03/2019  PCP: Steven Paradise, MD  Admit date: 06/03/2019 Discharge date: 06/05/2019  Admitted From: Home Disposition: Home  Recommendations for Outpatient Follow-up:  1. Follow up with PCP in 1-2 weeks 2. Although with cardiology as a scheduled by them 3. Please obtain BMP/CBC in one week 4. Please follow up on the following pending results:  Home Health: None Equipment/Devices: None  Discharge Condition: Stable CODE STATUS: Full code Diet recommendation: Cardiac  Subjective: Seen and examined.  Wife at the bedside.  Denies any chest pain, shortness of breath or palpitation.  A little bit of frustration due to the fact that cardioversion could not be done due to intra-atrial thrombus.  Copy from admitting attending physician, HPI: Steven Gray is a 69 y.o. male with medical history significant of OSA: and HLD presenting with progressive SOB.  He reports that he had a knee surgery and then tore his calf muscle.  He has been SOB with activity like running, thought he was out of shape.  Somewhere along the way he decided it was more than that - he has been working out hard.  The last couple of nights he couldn't sleep, was very SOB with lying down.  It has crept up over the last few months - prevalent with exercise.  He did not feel like his heart as racing.  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  He checked his BP about 2 weeks ago - HR was 105.  He "just had a physical" on 11/8.  He had a little sniffle yesterday and went for a COVID test, not really due to the SOB.  No known COVID contacts.  No chest pain.   Copy from admitting attending physician ED Course:  SOB x weeks.  Found to be in afib with RVR - given Lovenox.  CXR with B infiltrates - ? COVID.  Brief/Interim Summary: Patient was admitted due to new onset atrial fibrillation with RVR.  Cardiology was consulted.  He was started on Toprol-XL as  well as Eliquis.  Plan was to do TEE cardioversion however he was found to have atrial thrombus on TEE today so cardioversion was deferred.  Cardiology saw him.  They modified his medications and cleared him for discharge as he is fairly stable.  He will be discharged on Toprol-XL, losartan, digoxin and Eliquis.  No Lasix was recommended.  He is being discharged in stable condition.  He will follow with PCP within a week and his cardiologist.  Discharge Diagnoses:  Principal Problem:   New onset atrial fibrillation (HCC) Active Problems:   Sleep apnea   Acute systolic CHF (congestive heart failure) (HCC)   Alcohol abuse   Hyperlipidemia   Atrial thrombus    Discharge Instructions  Discharge Instructions    Amb referral to AFIB Clinic   Complete by: As directed    Discharge patient   Complete by: As directed    Discharge disposition: 01-Home or Self Care   Discharge patient date: 06/05/2019     Allergies as of 06/05/2019   No Known Allergies     Medication List    TAKE these medications   apixaban 5 MG Tabs tablet Commonly known as: ELIQUIS Take 1 tablet (5 mg total) by mouth 2 (two) times daily.   atorvastatin 40 MG tablet Commonly known as: LIPITOR Take 1 tablet (40 mg total) by mouth daily at 6 PM.   digoxin 0.125 MG tablet Commonly  known as: LANOXIN Take 1 tablet (0.125 mg total) by mouth daily.   losartan 25 MG tablet Commonly known as: COZAAR Take 1 tablet (25 mg total) by mouth at bedtime.   metoprolol succinate 50 MG 24 hr tablet Commonly known as: TOPROL-XL Take 1 tablet (50 mg total) by mouth daily. Take with or immediately following a meal. Start taking on: June 06, 2019   zolpidem 10 MG tablet Commonly known as: AMBIEN Take 5-10 mg by mouth at bedtime as needed.      Follow-up Information    [facility] Follow up.   Specialty: Cardiology Why: 06/15/2019 @ 9:30AM, with C. Charlean Merl, PA-C Contact information: [address] [id number] mc [place] [address] [phone]       Duke Salvia, MD Follow up.   Specialty: Cardiology Why: 07/17/2019 @ 8:30AM Contact information: [address] [place] [address] [phone]        Steven Paradise, MD Follow up in 1 week(s).   Specialty: Internal Medicine Contact information: [address] [place] [address] [phone]          No Known Allergies  Consultations: Cardiology   Procedures/Studies: DG Chest Portable 1 View  Result Date: 06/03/2019 CLINICAL DATA:  Patient with progressive shortness of breath for 2 months. Worse when lying down. EXAM: PORTABLE CHEST 1 VIEW COMPARISON:  None. FINDINGS: Cardiac contours upper limits of normal. Mild elevation of the left hemidiaphragm. Mild bilateral interstitial pulmonary opacities. No pleural effusion or pneumothorax. Thoracic spine degenerative changes. IMPRESSION: Cardiac contours upper limits of normal. Mild bilateral interstitial opacities which may represent mild interstitial edema or atypical infectious process. Electronically Signed   By: Annia Belt M.D.   On: 06/03/2019 06:05   ECHOCARDIOGRAM COMPLETE  Result Date: 06/03/2019   ECHOCARDIOGRAM REPORT   Patient Name:   Steven Gray [place] Gray Date of Exam: 06/03/2019 Medical Rec #:  [id number]         Height:       73.0 in Accession #:    [id number]        Weight:       193.0 lb Date of Birth:  03-27-1951         BSA:          2.12 m Patient Age:    68 years          BP:           128/110 mmHg Patient Gender: M                 HR:           93 bpm. Exam Location:  Inpatient Procedure: 2D Echo Indications:    dyspnea  History:        Patient has no prior history of Echocardiogram examinations.                 Risk Factors:Dyslipidemia.  Sonographer:    Celene Skeen RDCS (AE) Referring Phys: [phone] HAO MENG IMPRESSIONS  1. Left ventricular ejection fraction, by visual estimation, is 30 to 35%. The left  ventricle has moderate to severely decreased function. There is no left ventricular hypertrophy.  2. Left ventricular diastolic parameters are indeterminate.  3. The left ventricle demonstrates global hypokinesis.  4. Atrial fibrillation makes accurate assessment of LVEF diffciult.  5. Global right ventricle has moderately reduced systolic function.The right ventricular size is normal. No increase in right ventricular wall thickness.  6. Left atrial  size was severely dilated.  7. Right atrial size was normal.  8. The mitral valve is abnormal. Mild mitral valve regurgitation.  9. The tricuspid valve is normal in structure. 10. The tricuspid valve is normal in structure. Tricuspid valve regurgitation is moderate. 11. The aortic valve is grossly normal. Aortic valve regurgitation is not visualized. 12. Pulmonic regurgitation is mild. 13. The pulmonic valve was normal in structure. Pulmonic valve regurgitation is mild. 14. Aortic dilatation noted. 15. There is mild dilatation of the ascending aorta. 16. Moderately elevated pulmonary artery systolic pressure. 17. The interatrial septum was not well visualized. FINDINGS  Left Ventricle: Left ventricular ejection fraction, by visual estimation, is 30 to 35%. The left ventricle has moderate to severely decreased function. The left ventricle demonstrates global hypokinesis. There is no left ventricular hypertrophy. Left ventricular diastolic parameters are indeterminate. Atrial fibrillation makes accurate assessment of LVEF diffciult. Right Ventricle: The right ventricular size is normal. No increase in right ventricular wall thickness. Global RV systolic function is has moderately reduced systolic function. The tricuspid regurgitant velocity is 2.76 m/s, and with an assumed right atrial pressure of 10 mmHg, the estimated right ventricular systolic pressure is moderately elevated at 40.4 mmHg. Left Atrium: Left atrial size was severely dilated. Right Atrium: Right atrial  size was normal in size Pericardium: There is no evidence of pericardial effusion. Mitral Valve: The mitral valve is abnormal. There is mild thickening of the mitral valve leaflet(s). Mild mitral valve regurgitation. Tricuspid Valve: The tricuspid valve is normal in structure. Tricuspid valve regurgitation is moderate. Aortic Valve: The aortic valve is grossly normal. Aortic valve regurgitation is not visualized. Pulmonic Valve: The pulmonic valve was normal in structure. Pulmonic valve regurgitation is mild. Pulmonic regurgitation is mild. Aorta: The aortic root is normal in size and structure and aortic dilatation noted. There is mild dilatation of the ascending aorta. Venous: The inferior vena cava was not well visualized. IAS/Shunts: The interatrial septum was not well visualized.  LEFT VENTRICLE PLAX 2D LVIDd:         5.80 cm LVIDs:         4.40 cm LV PW:         1.00 cm LV IVS:        1.00 cm LVOT diam:     2.30 cm LV SV:         79 ml LV SV Index:   37.01 LVOT Area:     4.15 cm  RIGHT VENTRICLE RV S prime:     8.16 cm/s TAPSE (M-mode): 1.1 cm LEFT ATRIUM             Index       RIGHT ATRIUM           Index LA diam:        3.70 cm 1.75 cm/m  RA Area:     19.10 cm LA Vol (A2C):   87.4 ml 41.24 ml/m RA Volume:   47.80 ml  22.55 ml/m LA Vol (A4C):   89.3 ml 42.14 ml/m LA Biplane Vol: 95.7 ml 45.16 ml/m  AORTIC VALVE LVOT Vmax:   66.60 cm/s LVOT Vmean:  49.500 cm/s LVOT VTI:    0.130 m  AORTA Ao Root diam: 4.20 cm MITRAL VALVE                       TRICUSPID VALVE MV Area (PHT): 3.85 cm            TR Peak  grad:   30.4 mmHg MV PHT:        57.13 msec          TR Vmax:        285.00 cm/s MV Decel Time: 197 msec MV E velocity: 86.10 cm/s 103 cm/s SHUNTS                                    Systemic VTI:  0.13 m                                    Systemic Diam: 2.30 cm  Dorris Carnes MD Electronically signed by Dorris Carnes MD Signature Date/Time: 06/03/2019/11:41:21 AM    Final    ECHO TEE  Result Date: 06/05/2019    TRANSESOPHOGEAL ECHO REPORT   Patient Name:   Steven [facility] Date of Exam: 06/05/2019 Medical Rec #:  [id number]         Height:       73.0 in Accession #:    [id number]        Weight:       189.8 lb Date of Birth:  03-01-1951         BSA:          2.10 m Patient Age:    64 years          BP:           122/76 mmHg Patient Gender: M                 HR:           115 bpm. Exam Location:  Inpatient  Procedure: Transesophageal Echo Indications:    Afib  History:        Patient has prior history of Echocardiogram examinations.  Sonographer:    Roseanna Rainbow RDCS Referring Phys: [id number] HAO MENG  PROCEDURE: The transesophogeal probe was passed through the esophogus of the patient. The patient developed no complications during the procedure. IMPRESSIONS  1. Left ventricular ejection fraction, by visual estimation, is 25 to 30%. The left ventricle has severely decreased function. There is no left ventricular hypertrophy.  2. Severely dilated left ventricular internal cavity size.  3. The left ventricle demonstrates global hypokinesis.  4. Global right ventricle has moderately reduced systolic function.The right ventricular size is moderately enlarged. No increase in right ventricular wall thickness.  5. Left atrial size was severely dilated.  6. Dense smoke with LAA thrombus present Calcified indicating some chronicity Confirmed presence on 3D imaging of appendage.  7. PFO present.  8. Right atrial size was moderately dilated.  9. The mitral valve is normal in structure. Mild mitral valve regurgitation. 10. The tricuspid valve is normal in structure. 11. The tricuspid valve is normal in structure. Tricuspid valve regurgitation is mild. 12. The aortic valve is tricuspid. Aortic valve regurgitation is trivial. Mild aortic valve sclerosis without stenosis. 13. The pulmonic valve was normal in structure. Pulmonic valve regurgitation is mild. 14. Aortic dilatation noted. 15. There is mild dilatation of the aortic root measuring 38  mm. 33. DCC not perfomed as patient had LAA thrombus. 17. Evidence of atrial level shunting detected by color flow Doppler. FINDINGS  Left Ventricle: Left ventricular ejection fraction, by visual estimation, is 25 to 30%. The left ventricle has severely decreased function. The left ventricle demonstrates  global hypokinesis. The left ventricular internal cavity size was severely dilated left ventricle. There is no left ventricular hypertrophy. Right Ventricle: The right ventricular size is moderately enlarged. No increase in right ventricular wall thickness. Global RV systolic function is has moderately reduced systolic function. Left Atrium: Left atrial size was severely dilated. Dense smoke with LAA thrombus present Calcified indicating some chronicity Confirmed presence on 3D imaging of appendage. Right Atrium: Right atrial size was moderately dilated Pericardium: There is no evidence of pericardial effusion. Mitral Valve: The mitral valve is normal in structure. Mild mitral valve regurgitation. Tricuspid Valve: The tricuspid valve is normal in structure. Tricuspid valve regurgitation is mild. Aortic Valve: The aortic valve is tricuspid. Aortic valve regurgitation is trivial. Mild aortic valve sclerosis is present, with no evidence of aortic valve stenosis. Pulmonic Valve: The pulmonic valve was normal in structure. Pulmonic valve regurgitation is mild. Aorta: Aortic dilatation noted. There is mild dilatation of the aortic root measuring 38 mm. Shunts: PFO present. Evidence of atrial level shunting detected by color flow Doppler. Additional Comments: DCC not perfomed as patient had LAA thrombus.  Charlton Haws MD Electronically signed by Charlton Haws MD Signature Date/Time: 06/05/2019/9:25:49 AM    Final       Discharge Exam: Vitals:   06/05/19 0942 06/05/19 1035  BP: 98/63 118/87  Pulse: (!) 55 86  Resp: 20   Temp:    SpO2: 99%    Vitals:   06/05/19 0927 06/05/19 0937 06/05/19 0942 06/05/19 1035  BP:   (!) 96/48 98/63 118/87  Pulse: (!) 47 60 (!) 55 86  Resp: (!) 21 (!) 25 20   Temp:      TempSrc:      SpO2: 94% (!) 88% 99%   Weight:      Height:        General: Pt is alert, awake, not in acute distress Cardiovascular: Irregularly irregular rate and rhythm, S1/S2 +, no rubs, no gallops Respiratory: CTA bilaterally, no wheezing, no rhonchi Abdominal: Soft, NT, ND, bowel sounds + Extremities: no edema, no cyanosis    The results of significant diagnostics from this hospitalization (including imaging, microbiology, ancillary and laboratory) are listed below for reference.     Microbiology: Recent Results (from the past 240 hour(s))  Novel Coronavirus, NAA ([facility])     Status: None   Collection Time: 06/02/19 11:40 AM   Specimen: Nasopharyngeal(NP) swabs in vial transport medium   NASOPHARYNGE  TESTING  Result Value Ref Range Status   SARS-CoV-2, NAA Not Detected Not Detected Final    Comment: This nucleic acid amplification test was developed and its performance characteristics determined by [employer]. Nucleic acid amplification tests include RT-PCR and TMA. This test has not been FDA cleared or approved. This test has been authorized by FDA under an Emergency Use Authorization (EUA). This test is only authorized for the duration of time the declaration that circumstances exist justifying the authorization of the emergency use of in vitro diagnostic tests for detection of SARS-CoV-2 virus and/or diagnosis of COVID-19 infection under section 564(b)(1) of the Act, 21 U.S.C. 213YQM-5(H) (1), unless the authorization is terminated or revoked sooner. When diagnostic testing is negative, the possibility of a false negative result should be considered in the context of a patient's recent exposures and the presence of clinical signs and symptoms consistent with COVID-19. An individual without symptoms of COVID-19 and who is not shedding SARS-CoV-2 virus wo uld expect  to have a negative (not detected) result in this  assay.   SARS CORONAVIRUS 2 (TAT 6-24 HRS) Nasopharyngeal Nasopharyngeal Swab     Status: None   Collection Time: 06/03/19  6:29 AM   Specimen: Nasopharyngeal Swab  Result Value Ref Range Status   SARS Coronavirus 2 NEGATIVE NEGATIVE Final    Comment: (NOTE) SARS-CoV-2 target nucleic acids are NOT DETECTED. The SARS-CoV-2 RNA is generally detectable in upper and lower respiratory specimens during the acute phase of infection. Negative results do not preclude SARS-CoV-2 infection, do not rule out co-infections with other pathogens, and should not be used as the sole basis for treatment or other patient management decisions. Negative results must be combined with clinical observations, patient history, and epidemiological information. The expected result is Negative. Fact Sheet for Patients: HairSlick.no Fact Sheet for Healthcare Providers: quierodirigir.com This test is not yet approved or cleared by the [place] FDA and  has been authorized for detection and/or diagnosis of SARS-CoV-2 by FDA under an Emergency Use Authorization (EUA). This EUA will remain  in effect (meaning this test can be used) for the duration of the COVID-19 declaration under Section 56 4(b)(1) of the Act, 21 U.S.C. section 360bbb-3(b)(1), unless the authorization is terminated or revoked sooner. Performed at [facility], [address] [place], [place] [address]      Labs: BNP (last 3 results) Recent Labs    06/03/19 0550  BNP 509.4*   Basic Metabolic Panel: Recent Labs  Lab 06/03/19 0550 06/04/19 0215 06/05/19 0225  NA 139 141 140  K 4.0 3.5 3.5  CL 106 105 101  CO2 23 26 27   GLUCOSE 120* 117* 104*  BUN 24* 22 22  CREATININE 1.41* 1.31* 1.34*  CALCIUM 9.5 9.6 9.7   Liver Function Tests: No results for input(s): AST, ALT, ALKPHOS, BILITOT, PROT, ALBUMIN in the last 168  hours. No results for input(s): LIPASE, AMYLASE in the last 168 hours. No results for input(s): AMMONIA in the last 168 hours. CBC: Recent Labs  Lab 06/03/19 0550 06/04/19 0215  WBC 8.9 7.8  NEUTROABS 6.4 4.9  HGB 16.3 15.0  HCT 50.1 45.2  MCV 102.7* 99.8  PLT 152 166   Cardiac Enzymes: No results for input(s): CKTOTAL, CKMB, CKMBINDEX, TROPONINI in the last 168 hours. BNP: Invalid input(s): POCBNP CBG: No results for input(s): GLUCAP in the last 168 hours. D-Dimer No results for input(s): DDIMER in the last 72 hours. Hgb A1c Recent Labs    06/04/19 0215  HGBA1C 5.8*   Lipid Profile Recent Labs    06/04/19 0215  CHOL 164  HDL 33*  LDLCALC 112*  TRIG 94  CHOLHDL 5.0   Thyroid function studies Recent Labs    06/03/19 1021  TSH 0.737   Anemia work up No results for input(s): VITAMINB12, FOLATE, FERRITIN, TIBC, IRON, RETICCTPCT in the last 72 hours. Urinalysis No results found for: COLORURINE, APPEARANCEUR, LABSPEC, PHURINE, GLUCOSEU, HGBUR, BILIRUBINUR, KETONESUR, PROTEINUR, UROBILINOGEN, NITRITE, LEUKOCYTESUR Sepsis Labs Invalid input(s): PROCALCITONIN,  WBC,  LACTICIDVEN Microbiology Recent Results (from the past 240 hour(s))  Novel Coronavirus, NAA ([facility])     Status: None   Collection Time: 06/02/19 11:40 AM   Specimen: Nasopharyngeal(NP) swabs in vial transport medium   NASOPHARYNGE  TESTING  Result Value Ref Range Status   SARS-CoV-2, NAA Not Detected Not Detected Final    Comment: This nucleic acid amplification test was developed and its performance characteristics determined by 06/04/19. Nucleic acid amplification tests include RT-PCR and TMA. This test has not been FDA cleared  or approved. This test has been authorized by FDA under an Emergency Use Authorization (EUA). This test is only authorized for the duration of time the declaration that circumstances exist justifying the authorization of the emergency use of in  vitro diagnostic tests for detection of SARS-CoV-2 virus and/or diagnosis of COVID-19 infection under section 564(b)(1) of the Act, 21 U.S.C. 960AVW-0(J) (1), unless the authorization is terminated or revoked sooner. When diagnostic testing is negative, the possibility of a false negative result should be considered in the context of a patient's recent exposures and the presence of clinical signs and symptoms consistent with COVID-19. An individual without symptoms of COVID-19 and who is not shedding SARS-CoV-2 virus wo uld expect to have a negative (not detected) result in this assay.   SARS CORONAVIRUS 2 (TAT 6-24 HRS) Nasopharyngeal Nasopharyngeal Swab     Status: None   Collection Time: 06/03/19  6:29 AM   Specimen: Nasopharyngeal Swab  Result Value Ref Range Status   SARS Coronavirus 2 NEGATIVE NEGATIVE Final    Comment: (NOTE) SARS-CoV-2 target nucleic acids are NOT DETECTED. The SARS-CoV-2 RNA is generally detectable in upper and lower respiratory specimens during the acute phase of infection. Negative results do not preclude SARS-CoV-2 infection, do not rule out co-infections with other pathogens, and should not be used as the sole basis for treatment or other patient management decisions. Negative results must be combined with clinical observations, patient history, and epidemiological information. The expected result is Negative. Fact Sheet for Patients: HairSlick.no Fact Sheet for Healthcare Providers: quierodirigir.com This test is not yet approved or cleared by the [place] FDA and  has been authorized for detection and/or diagnosis of SARS-CoV-2 by FDA under an Emergency Use Authorization (EUA). This EUA will remain  in effect (meaning this test can be used) for the duration of the COVID-19 declaration under Section 56 4(b)(1) of the Act, 21 U.S.C. section 360bbb-3(b)(1), unless the authorization is terminated  or revoked sooner. Performed at [facility], [address] [place], [place] [address]      Time coordinating discharge: Over 30 minutes  SIGNED:   Hughie Closs, MD  [facility] 06/05/2019, 12:56 PM  If 7PM-7AM, please contact night-coverage [email or web address] Password [email or web address]

## 2019-06-05 NOTE — Interval H&P Note (Signed)
History and Physical Interval Note:  06/05/2019 8:47 AM  Steven Gray  has presented today for surgery, with the diagnosis of afib.  The various methods of treatment have been discussed with the patient and family. After consideration of risks, benefits and other options for treatment, the patient has consented to  Procedure(s): TRANSESOPHAGEAL ECHOCARDIOGRAM (TEE) (N/A) CARDIOVERSION (N/A) as a surgical intervention.  The patient's history has been reviewed, patient examined, no change in status, stable for surgery.  I have reviewed the patient's chart and labs.  Questions were answered to the patient's satisfaction.     Charlton Haws

## 2019-06-05 NOTE — Progress Notes (Signed)
  Echocardiogram Echocardiogram Transesophageal has been performed.  Steven Gray 06/05/2019, 9:27 AM

## 2019-06-05 NOTE — Anesthesia Preprocedure Evaluation (Addendum)
Anesthesia Evaluation  Patient identified by MRN, date of birth, ID band Patient awake    Reviewed: Allergy & Precautions, NPO status , Patient's Chart, lab work & pertinent test results  History of Anesthesia Complications Negative for: history of anesthetic complications  Airway Mallampati: I  TM Distance: >3 FB Neck ROM: Full    Dental  (+) Teeth Intact   Pulmonary sleep apnea ,    Pulmonary exam normal        Cardiovascular +CHF  + dysrhythmias Atrial Fibrillation  Rhythm:Irregular Rate:Tachycardia     Neuro/Psych negative neurological ROS  negative psych ROS   GI/Hepatic negative GI ROS, Neg liver ROS,   Endo/Other  negative endocrine ROS  Renal/GU negative Renal ROS  negative genitourinary   Musculoskeletal negative musculoskeletal ROS (+)   Abdominal   Peds  Hematology negative hematology ROS (+)   Anesthesia Other Findings Echo 05/07/19: EF 30-35%, moderate RV systolic dysfunction, mild mR, mild PR, moderate pulmonary HTN  Reproductive/Obstetrics                            Anesthesia Physical Anesthesia Plan  ASA: III  Anesthesia Plan: MAC   Post-op Pain Management:    Induction: Intravenous  PONV Risk Score and Plan: 1 and Propofol infusion, TIVA and Treatment may vary due to age or medical condition  Airway Management Planned: Natural Airway, Nasal Cannula and Simple Face Mask  Additional Equipment: None  Intra-op Plan:   Post-operative Plan:   Informed Consent: I have reviewed the patients History and Physical, chart, labs and discussed the procedure including the risks, benefits and alternatives for the proposed anesthesia with the patient or authorized representative who has indicated his/her understanding and acceptance.       Plan Discussed with:   Anesthesia Plan Comments:        Anesthesia Quick Evaluation

## 2019-06-05 NOTE — Discharge Instructions (Signed)

## 2019-06-06 ENCOUNTER — Ambulatory Visit: Payer: Commercial Managed Care - PPO | Admitting: Cardiology

## 2019-06-07 ENCOUNTER — Encounter: Payer: Self-pay | Admitting: *Deleted

## 2019-06-09 ENCOUNTER — Other Ambulatory Visit: Payer: Commercial Managed Care - PPO

## 2019-06-15 ENCOUNTER — Encounter (HOSPITAL_COMMUNITY): Payer: Self-pay | Admitting: Physician Assistant

## 2019-06-15 ENCOUNTER — Other Ambulatory Visit: Payer: Self-pay

## 2019-06-15 ENCOUNTER — Other Ambulatory Visit (HOSPITAL_COMMUNITY): Payer: Commercial Managed Care - PPO | Admitting: Physician Assistant

## 2019-06-15 ENCOUNTER — Ambulatory Visit (HOSPITAL_COMMUNITY)
Admit: 2019-06-15 | Discharge: 2019-06-15 | Disposition: A | Discharge status: Home / Self Care | Payer: Commercial Managed Care - PPO | Attending: Physician Assistant | Admitting: Physician Assistant

## 2019-06-15 VITALS — BP 122/70 | HR 87 | Ht 73.0 in | Wt 192.6 lb

## 2019-06-15 DIAGNOSIS — Z7901 Long term (current) use of anticoagulants: Secondary | ICD-10-CM | POA: Insufficient documentation

## 2019-06-15 DIAGNOSIS — I5021 Acute systolic (congestive) heart failure: Secondary | ICD-10-CM | POA: Insufficient documentation

## 2019-06-15 DIAGNOSIS — I081 Rheumatic disorders of both mitral and tricuspid valves: Secondary | ICD-10-CM | POA: Diagnosis not present

## 2019-06-15 DIAGNOSIS — Z79899 Other long term (current) drug therapy: Secondary | ICD-10-CM | POA: Diagnosis not present

## 2019-06-15 DIAGNOSIS — D6869 Other thrombophilia: Secondary | ICD-10-CM | POA: Diagnosis not present

## 2019-06-15 DIAGNOSIS — I4819 Other persistent atrial fibrillation: Secondary | ICD-10-CM | POA: Insufficient documentation

## 2019-06-15 DIAGNOSIS — G4733 Obstructive sleep apnea (adult) (pediatric): Secondary | ICD-10-CM | POA: Diagnosis not present

## 2019-06-15 DIAGNOSIS — E785 Hyperlipidemia, unspecified: Secondary | ICD-10-CM | POA: Diagnosis not present

## 2019-06-15 LAB — DIGOXIN LEVEL: Digoxin Level: 0.2 ng/mL — ABNORMAL LOW (ref 0.8–2.0)

## 2019-06-15 NOTE — Patient Instructions (Addendum)
Cardioversion scheduled for Tuesday, March 2nd  - Arrive at the [facility] and go to admitting at [facility] not eat or drink anything after midnight the night prior to your procedure.  - Take all your morning medication with a sip of water prior to arrival.  - You will not be able to drive home after your procedure.

## 2019-06-15 NOTE — Progress Notes (Signed)
Primary Care Physician: Burnard Bunting, MD Primary Electrophysiologist: Dr Caryl Comes Referring Physician: Tommye Standard   Steven Gray is a 69 y.o. male with a history of HLD, OSA, persistent atrial fibrillation, systolic CHF  who presents for follow up in the [facility].  The patient was initially diagnosed with atrial fibrillation 06/03/19 after presenting to the ER with a 53-month onset of worsening dyspnea, orthopnea and PND.  Patient was previously seen by Dr. Burt Knack in 2014 for palpitation.  ETT at the time was low risk.  Heart monitor showed sinus rhythm with PAC and PVC, otherwise no significant arrhythmia. On arrival to the ED at [facility] on 06/03/2019, he was noted to be in new atrial fibrillation with RVR with heart rate of 110-120s. Echo showed reduced EF 30-35% with a severely dilated LA. Patient was started on Eliquis for a CHADS2VASC score of 2. Plan was for patient to undergo TEE/DCCV but TEE showed dense smoke and LA thrombus and so DCCV was not attempted. He was discharged on Toprol and digoxin for rate control. Patient reports that he has done well since his hospitalization with minimal symptoms. He does have some mild SOB with exertion but is otherwise asymptomatic. His pulse rate has been well controlled at home per his FitBit. He is rate controlled today.  Today, he denies symptoms of palpitations, chest pain, orthopnea, PND, lower extremity edema, dizziness, presyncope, syncope, bleeding, or neurologic sequela. The patient is tolerating medications without difficulties and is otherwise without complaint today.    Atrial Fibrillation Risk Factors:  he does have symptoms or diagnosis of sleep apnea. he is compliant with CPAP therapy. he does not have a history of rheumatic fever. he does have a history of alcohol use. The patient does not have a history of early familial atrial fibrillation or other arrhythmias.  he has a BMI of Body  mass index is 25.41 kg/m.Marland Kitchen Filed Weights   06/15/19 0922  Weight: 87.4 kg    Family History  Problem Relation Age of Onset  . Polycystic kidney disease Mother   . Vascular Disease Father        vascular dementia  . CAD Maternal Grandfather   . Atrial fibrillation Neg Hx      Atrial Fibrillation Management history:  Previous antiarrhythmic drugs: none Previous cardioversions: none Previous ablations: none CHADS2VASC score: 2 Anticoagulation history: Eliquis   Past Medical History:  Diagnosis Date  . Acute systolic CHF (congestive heart failure) ([place]) 06/03/2019  . Alcohol abuse 06/03/2019  . Atrial fibrillation ([place]) 06/03/2019  . 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 Heart palpitations 11/30/2012  . Hyperlipidemia    does not take medication for this, denies  . Increased prostate specific antigen (PSA) velocity   . Right knee pain 12/07/2016  . Sleep apnea    wears CPAP   Past Surgical History:  Procedure Laterality Date  . MENISCUS REPAIR    . TEE WITHOUT CARDIOVERSION N/A 06/05/2019   Procedure: TRANSESOPHAGEAL ECHOCARDIOGRAM (TEE);  Surgeon: Josue Hector, MD;  Location: [facility] ENDOSCOPY;  Service: Cardiovascular;  Laterality: N/A;    Current Outpatient Medications  Medication Sig Dispense Refill  . apixaban (ELIQUIS) 5 MG TABS tablet Take 1 tablet (5 mg total) by mouth 2 (two) times daily. 60 tablet 0  . atorvastatin (LIPITOR) 40 MG tablet Take 1 tablet (40 mg total) by mouth daily at 6 PM. 30 tablet 0  . digoxin (LANOXIN) 0.125 MG tablet Take 1  tablet (0.125 mg total) by mouth daily. 30 tablet 0  . furosemide (LASIX) 20 MG tablet Take 1 tablet (20 mg total) by mouth daily. 90 tablet 3  . losartan (COZAAR) 25 MG tablet Take 1 tablet (25 mg total) by mouth at bedtime. 30 tablet 0  . metoprolol succinate (TOPROL-XL) 50 MG 24 hr tablet Take 1 tablet (50 mg total) by mouth daily. Take with or immediately following a meal. 30 tablet 0  . zolpidem (AMBIEN) 10 MG  tablet Take 5-10 mg by mouth at bedtime as needed.     No current facility-administered medications for this encounter.    No Known Allergies  Social History   Socioeconomic History  . Marital status: Married    Spouse name: Not on file  . Number of children: Not on file  . Years of education: Not on file  . Highest education level: Not on file  Occupational History  . Occupation: warehousing  Tobacco Use  . Smoking status: Never Smoker  . Smokeless tobacco: Never Used  Substance and Sexual Activity  . Alcohol use: Not Currently    Comment: 15+ drinks/week wine  . Drug use: No  . Sexual activity: Not on file  Other Topics Concern  . Not on file  Social History Narrative   Married with 2 sons, nonsmoker, social ETOH, works as executive in trucking industry. Frequent exercise.   Social Determinants of Health   Financial Resource Strain:   . Difficulty of Paying Living Expenses: Not on file  Food Insecurity:   . Worried About Running Out of Food in the Last Year: Not on file  . Ran Out of Food in the Last Year: Not on file  Transportation Needs:   . Lack of Transportation (Medical): Not on file  . Lack of Transportation (Non-Medical): Not on file  Physical Activity:   . Days of Exercise per Week: Not on file  . Minutes of Exercise per Session: Not on file  Stress:   . Feeling of Stress : Not on file  Social Connections:   . Frequency of Communication with Friends and Family: Not on file  . Frequency of Social Gatherings with Friends and Family: Not on file  . Attends Religious Services: Not on file  . Active Member of Clubs or Organizations: Not on file  . Attends Club or Organization Meetings: Not on file  . Marital Status: Not on file  Intimate Partner Violence:   . Fear of Current or Ex-Partner: Not on file  . Emotionally Abused: Not on file  . Physically Abused: Not on file  . Sexually Abused: Not on file     ROS- All systems are reviewed and negative  except as per the HPI above.  Physical Exam: Vitals:   06/15/19 0922  BP: 122/70  Pulse: 87  Weight: 87.4 kg  Height: 6' 1" (1.854 m)    GEN- The patient is well appearing, alert and oriented x 3 today.   Head- normocephalic, atraumatic Eyes-  Sclera clear, conjunctiva pink Ears- hearing intact Oropharynx- clear Neck- supple  Lungs- Clear to ausculation bilaterally, normal work of breathing Heart- irregular rate and rhythm, no murmurs, rubs or gallops  GI- soft, NT, ND, + BS Extremities- no clubbing, cyanosis, or edema MS- no significant deformity or atrophy Skin- no rash or lesion Psych- euthymic mood, full affect Neuro- strength and sensation are intact  Wt Readings from Last 3 Encounters:  06/15/19 87.4 kg  06/05/19 86.1 kg  05/01/19 88.5 kg      EKG today demonstrates afib HR 87, NST, QRS 98, QTc 462  Echo 06/03/19 demonstrated  1. Left ventricular ejection fraction, by visual estimation, is 30 to  35%. The left ventricle has moderate to severely decreased function. There  is no left ventricular hypertrophy.  2. Left ventricular diastolic parameters are indeterminate.  3. The left ventricle demonstrates global hypokinesis.  4. Atrial fibrillation makes accurate assessment of LVEF diffciult.  5. Global right ventricle has moderately reduced systolic function.The  right ventricular size is normal. No increase in right ventricular wall  thickness.  6. Left atrial size was severely dilated.  7. Right atrial size was normal.  8. The mitral valve is abnormal. Mild mitral valve regurgitation.  9. The tricuspid valve is normal in structure.  10. The tricuspid valve is normal in structure. Tricuspid valve  regurgitation is moderate.  11. The aortic valve is grossly normal. Aortic valve regurgitation is not  visualized.  12. Pulmonic regurgitation is mild.  13. The pulmonic valve was normal in structure. Pulmonic valve  regurgitation is mild.  14. Aortic  dilatation noted.  15. There is mild dilatation of the ascending aorta.  16. Moderately elevated pulmonary artery systolic pressure.  17. The interatrial septum was not well visualized.   Epic records are reviewed at length today  CHA2DS2-VASc Score = 2 The patient's score is based upon: CHF History: Yes HTN History: No Age : 3-74 Diabetes History: No Stroke History: No Vascular Disease History: No Gender: Male      ASSESSMENT AND PLAN: 1. Persistent Atrial Fibrillation (ICD10:  I48.19) The patient's CHA2DS2-VASc score is 2, indicating a 2.2% annual risk of stroke.   General education about afib provided and questions answered. Patient in rate controlled afib. Will plan for TEE/DCCV on 3/1 or later. Continue Eliquis 5 mg BID, patient denies any missed doses. Continue Toprol 50 mg daily Continue digoxin 0.125 mg daily. Will check digoxin trough level this afternoon, patient took a dose this AM.  2. Secondary Hypercoagulable State (ICD10:  D68.69) The patient is at significant risk for stroke/thromboembolism based upon his CHA2DS2-VASc Score of 2.  Continue Apixaban (Eliquis).   3. Obstructive sleep apnea The importance of adequate treatment of sleep apnea was discussed today in order to improve our ability to maintain sinus rhythm long term. Patient reports compliance with his CPAP therapy.  4. Acute systolic CHF New diagnosis, likely tachycardia mediated. Plan to recheck echo once SR restored noted.  No signs or symptoms of fluid overload today.   Follow up with Dr Graciela Husbands post DCCV.   Jorja Loa PA-C [facility] [address] [place], [place] [address] [phone] 06/15/2019 10:00 AM

## 2019-06-15 NOTE — H&P (View-Only) (Signed)
Primary Care Physician: Burnard Bunting, MD Primary Electrophysiologist: Dr Caryl Comes Referring Physician: Tommye Standard   Harinder Romas Steven Gray is a 69 y.o. male with a history of HLD, OSA, persistent atrial fibrillation, systolic CHF  who presents for follow up in the [facility].  The patient was initially diagnosed with atrial fibrillation 06/03/19 after presenting to the ER with a 53-month onset of worsening dyspnea, orthopnea and PND.  Patient was previously seen by Dr. Burt Knack in 2014 for palpitation.  ETT at the time was low risk.  Heart monitor showed sinus rhythm with PAC and PVC, otherwise no significant arrhythmia. On arrival to the ED at [facility] on 06/03/2019, he was noted to be in new atrial fibrillation with RVR with heart rate of 110-120s. Echo showed reduced EF 30-35% with a severely dilated LA. Patient was started on Eliquis for a CHADS2VASC score of 2. Plan was for patient to undergo TEE/DCCV but TEE showed dense smoke and LA thrombus and so DCCV was not attempted. He was discharged on Toprol and digoxin for rate control. Patient reports that he has done well since his hospitalization with minimal symptoms. He does have some mild SOB with exertion but is otherwise asymptomatic. His pulse rate has been well controlled at home per his FitBit. He is rate controlled today.  Today, he denies symptoms of palpitations, chest pain, orthopnea, PND, lower extremity edema, dizziness, presyncope, syncope, bleeding, or neurologic sequela. The patient is tolerating medications without difficulties and is otherwise without complaint today.    Atrial Fibrillation Risk Factors:  he does have symptoms or diagnosis of sleep apnea. he is compliant with CPAP therapy. he does not have a history of rheumatic fever. he does have a history of alcohol use. The patient does not have a history of early familial atrial fibrillation or other arrhythmias.  he has a BMI of Body  mass index is 25.41 kg/m.Marland Kitchen Filed Weights   06/15/19 0922  Weight: 87.4 kg    Family History  Problem Relation Age of Onset  . Polycystic kidney disease Mother   . Vascular Disease Father        vascular dementia  . CAD Maternal Grandfather   . Atrial fibrillation Neg Hx      Atrial Fibrillation Management history:  Previous antiarrhythmic drugs: none Previous cardioversions: none Previous ablations: none CHADS2VASC score: 2 Anticoagulation history: Eliquis   Past Medical History:  Diagnosis Date  . Acute systolic CHF (congestive heart failure) ([place]) 06/03/2019  . Alcohol abuse 06/03/2019  . Atrial fibrillation ([place]) 06/03/2019  . 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 Heart palpitations 11/30/2012  . Hyperlipidemia    does not take medication for this, denies  . Increased prostate specific antigen (PSA) velocity   . Right knee pain 12/07/2016  . Sleep apnea    wears CPAP   Past Surgical History:  Procedure Laterality Date  . MENISCUS REPAIR    . TEE WITHOUT CARDIOVERSION N/A 06/05/2019   Procedure: TRANSESOPHAGEAL ECHOCARDIOGRAM (TEE);  Surgeon: Josue Hector, MD;  Location: [facility] ENDOSCOPY;  Service: Cardiovascular;  Laterality: N/A;    Current Outpatient Medications  Medication Sig Dispense Refill  . apixaban (ELIQUIS) 5 MG TABS tablet Take 1 tablet (5 mg total) by mouth 2 (two) times daily. 60 tablet 0  . atorvastatin (LIPITOR) 40 MG tablet Take 1 tablet (40 mg total) by mouth daily at 6 PM. 30 tablet 0  . digoxin (LANOXIN) 0.125 MG tablet Take 1  tablet (0.125 mg total) by mouth daily. 30 tablet 0  . furosemide (LASIX) 20 MG tablet Take 1 tablet (20 mg total) by mouth daily. 90 tablet 3  . losartan (COZAAR) 25 MG tablet Take 1 tablet (25 mg total) by mouth at bedtime. 30 tablet 0  . metoprolol succinate (TOPROL-XL) 50 MG 24 hr tablet Take 1 tablet (50 mg total) by mouth daily. Take with or immediately following a meal. 30 tablet 0  . zolpidem (AMBIEN) 10 MG  tablet Take 5-10 mg by mouth at bedtime as needed.     No current facility-administered medications for this encounter.    No Known Allergies  Social History   Socioeconomic History  . Marital status: Married    Spouse name: Not on file  . Number of children: Not on file  . Years of education: Not on file  . Highest education level: Not on file  Occupational History  . Occupation: warehousing  Tobacco Use  . Smoking status: Never Smoker  . Smokeless tobacco: Never Used  Substance and Sexual Activity  . Alcohol use: Not Currently    Comment: 15+ drinks/week wine  . Drug use: No  . Sexual activity: Not on file  Other Topics Concern  . Not on file  Social History Narrative   Married with 2 sons, nonsmoker, social ETOH, works as Psychologist, educational in Geologist, engineering. Frequent exercise.   Social Determinants of Health   Financial Resource Strain:   . Difficulty of Paying Living Expenses: Not on file  Food Insecurity:   . Worried About Programme researcher, broadcasting/film/video in the Last Year: Not on file  . Ran Out of Food in the Last Year: Not on file  Transportation Needs:   . Lack of Transportation (Medical): Not on file  . Lack of Transportation (Non-Medical): Not on file  Physical Activity:   . Days of Exercise per Week: Not on file  . Minutes of Exercise per Session: Not on file  Stress:   . Feeling of Stress : Not on file  Social Connections:   . Frequency of Communication with Friends and Family: Not on file  . Frequency of Social Gatherings with Friends and Family: Not on file  . Attends Religious Services: Not on file  . Active Member of Clubs or Organizations: Not on file  . Attends Banker Meetings: Not on file  . Marital Status: Not on file  Intimate Partner Violence:   . Fear of Current or Ex-Partner: Not on file  . Emotionally Abused: Not on file  . Physically Abused: Not on file  . Sexually Abused: Not on file     ROS- All systems are reviewed and negative  except as per the HPI above.  Physical Exam: Vitals:   06/15/19 0922  BP: 122/70  Pulse: 87  Weight: 87.4 kg  Height: 6\' 1"  (1.854 m)    GEN- The patient is well appearing, alert and oriented x 3 today.   Head- normocephalic, atraumatic Eyes-  Sclera clear, conjunctiva pink Ears- hearing intact Oropharynx- clear Neck- supple  Lungs- Clear to ausculation bilaterally, normal work of breathing Heart- irregular rate and rhythm, no murmurs, rubs or gallops  GI- soft, NT, ND, + BS Extremities- no clubbing, cyanosis, or edema MS- no significant deformity or atrophy Skin- no rash or lesion Psych- euthymic mood, full affect Neuro- strength and sensation are intact  Wt Readings from Last 3 Encounters:  06/15/19 87.4 kg  06/05/19 86.1 kg  05/01/19 88.5 kg  EKG today demonstrates afib HR 87, NST, QRS 98, QTc 462  Echo 06/03/19 demonstrated  1. Left ventricular ejection fraction, by visual estimation, is 30 to  35%. The left ventricle has moderate to severely decreased function. There  is no left ventricular hypertrophy.  2. Left ventricular diastolic parameters are indeterminate.  3. The left ventricle demonstrates global hypokinesis.  4. Atrial fibrillation makes accurate assessment of LVEF diffciult.  5. Global right ventricle has moderately reduced systolic function.The  right ventricular size is normal. No increase in right ventricular wall  thickness.  6. Left atrial size was severely dilated.  7. Right atrial size was normal.  8. The mitral valve is abnormal. Mild mitral valve regurgitation.  9. The tricuspid valve is normal in structure.  10. The tricuspid valve is normal in structure. Tricuspid valve  regurgitation is moderate.  11. The aortic valve is grossly normal. Aortic valve regurgitation is not  visualized.  12. Pulmonic regurgitation is mild.  13. The pulmonic valve was normal in structure. Pulmonic valve  regurgitation is mild.  14. Aortic  dilatation noted.  15. There is mild dilatation of the ascending aorta.  16. Moderately elevated pulmonary artery systolic pressure.  17. The interatrial septum was not well visualized.   Epic records are reviewed at length today  CHA2DS2-VASc Score = 2 The patient's score is based upon: CHF History: Yes HTN History: No Age : 3-74 Diabetes History: No Stroke History: No Vascular Disease History: No Gender: Male      ASSESSMENT AND PLAN: 1. Persistent Atrial Fibrillation (ICD10:  I48.19) The patient's CHA2DS2-VASc score is 2, indicating a 2.2% annual risk of stroke.   General education about afib provided and questions answered. Patient in rate controlled afib. Will plan for TEE/DCCV on 3/1 or later. Continue Eliquis 5 mg BID, patient denies any missed doses. Continue Toprol 50 mg daily Continue digoxin 0.125 mg daily. Will check digoxin trough level this afternoon, patient took a dose this AM.  2. Secondary Hypercoagulable State (ICD10:  D68.69) The patient is at significant risk for stroke/thromboembolism based upon his CHA2DS2-VASc Score of 2.  Continue Apixaban (Eliquis).   3. Obstructive sleep apnea The importance of adequate treatment of sleep apnea was discussed today in order to improve our ability to maintain sinus rhythm long term. Patient reports compliance with his CPAP therapy.  4. Acute systolic CHF New diagnosis, likely tachycardia mediated. Plan to recheck echo once SR restored noted.  No signs or symptoms of fluid overload today.   Follow up with Dr Graciela Husbands post DCCV.   Jorja Loa PA-C [facility] [address] [place], [place] [address] [phone] 06/15/2019 10:00 AM

## 2019-06-29 ENCOUNTER — Other Ambulatory Visit: Payer: Self-pay

## 2019-06-29 MED ORDER — APIXABAN 5 MG PO TABS: 5.0000 mg | ORAL_TABLET | Freq: Two times a day (BID) | ORAL | 5 refills | Status: DC

## 2019-06-29 MED ORDER — LOSARTAN POTASSIUM 25 MG PO TABS: 25.0000 mg | ORAL_TABLET | Freq: Every day | ORAL | 0 refills | Status: DC

## 2019-06-29 MED ORDER — METOPROLOL SUCCINATE ER 50 MG PO TB24: 50.0000 mg | ORAL_TABLET | Freq: Every day | ORAL | 0 refills | Status: DC

## 2019-06-29 MED ORDER — DIGOXIN 125 MCG PO TABS: 0.1250 mg | ORAL_TABLET | Freq: Every day | ORAL | 0 refills | Status: DC

## 2019-06-29 MED ORDER — ATORVASTATIN CALCIUM 40 MG PO TABS: 40.0000 mg | ORAL_TABLET | Freq: Every day | ORAL | 0 refills | Status: DC

## 2019-06-29 NOTE — Telephone Encounter (Signed)
Prescription refill request for Eliquis received.  Last office visit: Afib clinic, 06/15/2019 Scr: 1.34, 06/05/2019 Age: 69 y.o. Weight: 87.4 kg   Prescription refill sent.

## 2019-06-30 ENCOUNTER — Other Ambulatory Visit (HOSPITAL_COMMUNITY)
Admission: RE | Admit: 2019-06-30 | Discharge: 2019-06-30 | Disposition: A | Discharge status: Home / Self Care | Payer: Commercial Managed Care - PPO | Source: Ambulatory Visit | Attending: Cardiology | Admitting: Cardiology

## 2019-06-30 ENCOUNTER — Ambulatory Visit (HOSPITAL_COMMUNITY)
Admission: RE | Admit: 2019-06-30 | Discharge: 2019-06-30 | Disposition: A | Discharge status: Home / Self Care | Payer: Commercial Managed Care - PPO | Source: Ambulatory Visit | Attending: Physician Assistant | Admitting: Physician Assistant

## 2019-06-30 ENCOUNTER — Other Ambulatory Visit: Payer: Self-pay

## 2019-06-30 DIAGNOSIS — Z01812 Encounter for preprocedural laboratory examination: Secondary | ICD-10-CM | POA: Insufficient documentation

## 2019-06-30 DIAGNOSIS — Z20822 Contact with and (suspected) exposure to covid-19: Secondary | ICD-10-CM | POA: Diagnosis not present

## 2019-06-30 LAB — SARS CORONAVIRUS 2 (TAT 6-24 HRS): SARS Coronavirus 2: NEGATIVE

## 2019-06-30 LAB — BASIC METABOLIC PANEL
Anion gap: 11 (ref 5–15)
BUN: 22 mg/dL (ref 8–23)
CO2: 25 mmol/L (ref 22–32)
Calcium: 9.9 mg/dL (ref 8.9–10.3)
Chloride: 107 mmol/L (ref 98–111)
Creatinine, Ser: 1.36 mg/dL — ABNORMAL HIGH (ref 0.61–1.24)
GFR calc Af Amer: >60 mL/min (ref >60)
GFR calc non Af Amer: 53 mL/min — ABNORMAL LOW (ref >60)
Glucose, Bld: 98 mg/dL (ref 70–99)
Potassium: 4.4 mmol/L (ref 3.5–5.1)
Sodium: 143 mmol/L (ref 135–145)

## 2019-06-30 LAB — CBC
HCT: 48.2 % (ref 39.0–52.0)
Hemoglobin: 15.8 g/dL (ref 13.0–17.0)
MCH: 33.1 pg (ref 26.0–34.0)
MCHC: 32.8 g/dL (ref 30.0–36.0)
MCV: 100.8 fL — ABNORMAL HIGH (ref 80.0–100.0)
Platelets: 166 10*3/uL (ref 150–400)
RBC: 4.78 MIL/uL (ref 4.22–5.81)
RDW: 11.5 % (ref 11.5–15.5)
WBC: 8.4 10*3/uL (ref 4.0–10.5)
nRBC: 0 % (ref 0.0–0.2)

## 2019-07-04 ENCOUNTER — Other Ambulatory Visit: Payer: Self-pay

## 2019-07-04 ENCOUNTER — Ambulatory Visit (HOSPITAL_COMMUNITY)
Admission: RE | Admit: 2019-07-04 | Discharge: 2019-07-04 | Disposition: A | Discharge status: Home / Self Care | Payer: Commercial Managed Care - PPO | Attending: Cardiology | Admitting: Cardiology

## 2019-07-04 ENCOUNTER — Encounter (HOSPITAL_COMMUNITY): Payer: Self-pay | Admitting: Cardiology

## 2019-07-04 ENCOUNTER — Ambulatory Visit (HOSPITAL_COMMUNITY): Payer: Commercial Managed Care - PPO | Admitting: Certified Registered Nurse Anesthetist

## 2019-07-04 ENCOUNTER — Encounter (HOSPITAL_COMMUNITY)
Admission: RE | Disposition: A | Discharge status: Home / Self Care | Payer: Self-pay | Source: Home / Self Care | Attending: Cardiology

## 2019-07-04 ENCOUNTER — Ambulatory Visit (HOSPITAL_BASED_OUTPATIENT_CLINIC_OR_DEPARTMENT_OTHER): Payer: Commercial Managed Care - PPO

## 2019-07-04 DIAGNOSIS — G4733 Obstructive sleep apnea (adult) (pediatric): Secondary | ICD-10-CM | POA: Diagnosis not present

## 2019-07-04 DIAGNOSIS — I5021 Acute systolic (congestive) heart failure: Secondary | ICD-10-CM | POA: Insufficient documentation

## 2019-07-04 DIAGNOSIS — I420 Dilated cardiomyopathy: Secondary | ICD-10-CM | POA: Diagnosis not present

## 2019-07-04 DIAGNOSIS — Z79899 Other long term (current) drug therapy: Secondary | ICD-10-CM | POA: Insufficient documentation

## 2019-07-04 DIAGNOSIS — Z7901 Long term (current) use of anticoagulants: Secondary | ICD-10-CM | POA: Insufficient documentation

## 2019-07-04 DIAGNOSIS — I082 Rheumatic disorders of both aortic and tricuspid valves: Secondary | ICD-10-CM | POA: Insufficient documentation

## 2019-07-04 DIAGNOSIS — Z8249 Family history of ischemic heart disease and other diseases of the circulatory system: Secondary | ICD-10-CM | POA: Diagnosis not present

## 2019-07-04 DIAGNOSIS — I4819 Other persistent atrial fibrillation: Secondary | ICD-10-CM

## 2019-07-04 DIAGNOSIS — D6869 Other thrombophilia: Secondary | ICD-10-CM | POA: Insufficient documentation

## 2019-07-04 DIAGNOSIS — I361 Nonrheumatic tricuspid (valve) insufficiency: Secondary | ICD-10-CM

## 2019-07-04 HISTORY — PX: TEE WITHOUT CARDIOVERSION: SHX5443

## 2019-07-04 HISTORY — PX: CARDIOVERSION: SHX1299

## 2019-07-04 SURGERY — ECHOCARDIOGRAM, TRANSESOPHAGEAL
Anesthesia: General

## 2019-07-04 MED ORDER — LIDOCAINE 2% (20 MG/ML) 5 ML SYRINGE
INTRAMUSCULAR | Status: DC | PRN
  Administered 2019-07-04: 60 mg via INTRAVENOUS
  Administered 2019-07-04: 20 mg via INTRAVENOUS

## 2019-07-04 MED ORDER — PROPOFOL 500 MG/50ML IV EMUL
INTRAVENOUS | Status: DC | PRN
  Administered 2019-07-04: 125 ug/kg/min via INTRAVENOUS

## 2019-07-04 MED ORDER — SODIUM CHLORIDE 0.9 % IV SOLN: INTRAVENOUS | Status: DC

## 2019-07-04 MED ORDER — EPHEDRINE SULFATE-NACL 50-0.9 MG/10ML-% IV SOSY
PREFILLED_SYRINGE | INTRAVENOUS | Status: DC | PRN
  Administered 2019-07-04 (×2): 5 mg via INTRAVENOUS

## 2019-07-04 MED ORDER — PROPOFOL 10 MG/ML IV BOLUS
INTRAVENOUS | Status: DC | PRN
  Administered 2019-07-04 (×4): 20 mg via INTRAVENOUS

## 2019-07-04 MED ORDER — LACTATED RINGERS IV SOLN: INTRAVENOUS | Status: DC

## 2019-07-04 NOTE — Progress Notes (Signed)
Echocardiogram Echocardiogram Transesophageal has been performed.  Steven Gray Steven Gray 07/04/2019, 9:51 AM

## 2019-07-04 NOTE — Anesthesia Postprocedure Evaluation (Signed)
Anesthesia Post Note  Patient: Steven Gray  Procedure(s) Performed: TRANSESOPHAGEAL ECHOCARDIOGRAM (TEE) (N/A ) CARDIOVERSION (N/A )     Patient location during evaluation: PACU Anesthesia Type: General Level of consciousness: awake and alert and oriented Pain management: pain level controlled Vital Signs Assessment: post-procedure vital signs reviewed and stable Respiratory status: spontaneous breathing, nonlabored ventilation and respiratory function stable Cardiovascular status: blood pressure returned to baseline and stable Postop Assessment: no apparent nausea or vomiting Anesthetic complications: no    Last Vitals:  Vitals:   07/04/19 0955 07/04/19 1001  BP: 104/63 (!) 94/49  Pulse: (!) 53 77  Resp: 17 (!) 21  Temp:    SpO2: 99% 99%    Last Pain:  Vitals:   07/04/19 1001  TempSrc:   PainSc: 0-No pain                 Hiroki Wint,[name]

## 2019-07-04 NOTE — Interval H&P Note (Signed)
History and Physical Interval Note:  07/04/2019 8:07 AM  Steven Gray  has presented today for surgery, with the diagnosis of AFIB.  The various methods of treatment have been discussed with the patient and family. After consideration of risks, benefits and other options for treatment, the patient has consented to  Procedure(s): TRANSESOPHAGEAL ECHOCARDIOGRAM (TEE) (N/A) CARDIOVERSION (N/A) as a surgical intervention.  The patient's history has been reviewed, patient examined, no change in status, stable for surgery.  I have reviewed the patient's chart and labs.  Questions were answered to the patient's satisfaction.     [name]

## 2019-07-04 NOTE — Anesthesia Preprocedure Evaluation (Signed)
Anesthesia Evaluation  Patient identified by MRN, date of birth, ID band Patient awake    Reviewed: Allergy & Precautions, NPO status , Patient's Chart, lab work & pertinent test results, reviewed documented beta blocker date and time   Airway Mallampati: I  TM Distance: >3 FB Neck ROM: Full    Dental no notable dental hx. (+) Teeth Intact   Pulmonary shortness of breath and with exertion, sleep apnea and Continuous Positive Airway Pressure Ventilation ,    Pulmonary exam normal breath sounds clear to auscultation       Cardiovascular +CHF  Normal cardiovascular exam+ dysrhythmias Atrial Fibrillation  Rhythm:Regular Rate:Normal  Hx/o LAA thrombus  Echo 06/05/19 1. Left ventricular ejection fraction, by visual estimation, is 25 to 30%. The left ventricle has severely decreased function. There is no left ventricular hypertrophy.  2. Severely dilated left ventricular internal cavity size.  3. The left ventricle demonstrates global hypokinesis.  4. Global right ventricle has moderately reduced systolic function.The right ventricular size is moderately enlarged. No increase in right ventricular wall thickness.  5. Left atrial size was severely dilated.  6. Dense smoke with LAA thrombus present Calcified indicating some chronicity Confirmed presence on 3D imaging of appendage.  7. PFO present.  8. Right atrial size was moderately dilated.  9. The mitral valve is normal in structure. Mild mitral valve  regurgitation.  10. The tricuspid valve is normal in structure.  11. The tricuspid valve is normal in structure. Tricuspid valve regurgitation is mild.  12. The aortic valve is tricuspid. Aortic valve regurgitation is trivial.  Mild aortic valve sclerosis without stenosis.  13. The pulmonic valve was normal in structure. Pulmonic valve  regurgitation is mild.  14. Aortic dilatation noted.  15. 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  47. DCC not perfomed as patient had LAA thrombus.  17. Evidence of atrial level shunting detected by color flow Doppler.   EKG 06/15/19 Atrial fibrillation, prolonged QTc   Neuro/Psych PSYCHIATRIC DISORDERS negative neurological ROS     GI/Hepatic (+)     substance abuse  alcohol use,   Endo/Other  Hyperlipidemia  Renal/GU negative Renal ROS  negative genitourinary   Musculoskeletal negative musculoskeletal ROS (+)   Abdominal   Peds  Hematology Eliquis therapy   Anesthesia Other Findings   Reproductive/Obstetrics                             Anesthesia Physical Anesthesia Plan  ASA: III  Anesthesia Plan: MAC and General   Post-op Pain Management:    Induction: Intravenous  PONV Risk Score and Plan:   Airway Management Planned:   Additional Equipment:   Intra-op Plan:   Post-operative Plan:   Informed Consent: I have reviewed the patients History and Physical, chart, labs and discussed the procedure including the risks, benefits and alternatives for the proposed anesthesia with the patient or authorized representative who has indicated his/her understanding and acceptance.     Dental advisory given  Plan Discussed with: CRNA, Surgeon and Anesthesiologist  Anesthesia Plan Comments:         Anesthesia Quick Evaluation

## 2019-07-04 NOTE — Anesthesia Procedure Notes (Signed)
Procedure Name: MAC Date/Time: 07/04/2019 9:04 AM Performed by: Janene Harvey, CRNA Pre-anesthesia Checklist: Patient identified, Emergency Drugs available, Suction available and Patient being monitored Oxygen Delivery Method: Nasal cannula Dental Injury: Teeth and Oropharynx as per pre-operative assessment

## 2019-07-04 NOTE — CV Procedure (Signed)
    PROCEDURE NOTE:  Procedure:  Transesophageal echocardiogram Operator:  [name], MD Indications:  Atrial Fibrillation Complications: None  During this procedure the patient is administered a total of Propofol 400 mg to achieve and maintain moderate conscious sedation.  The patient's heart rate, blood pressure, and oxygen saturation are monitored continuously during the procedure.   Results: Moderate to severe LV systolic dysfunction Moderately reduced RV systolic function.  Severely dilated  RA Severely dilated  LA with no evidence of thrombus.  There was spontaneous echo contrast noted in the LA and LA appendage with no evidence of thrombus.  Normal TV with mild TR PV not visualized Normal MV with trivial MR Normal trileaflet AV Normal interatrial septum Thoracic and ascending aorta not visualized.  Study was limited by patient developing significant irritation of his vocal cords due to secretions as well as sleep apnea a limited images obtained.   The patient went on to DCCV  Signed: [name], MD [facility]   Electrical Cardioversion Procedure Note Steven Gray [id number] 21-Apr-1951  Procedure: Electrical Cardioversion Indications:  Atrial Fibrillation  Time Out: Verified patient identification, verified procedure,medications/allergies/relevent history reviewed, required imaging and test results available.  Performed  Procedure Details  The patient was NPO after midnight. Anesthesia was administered at the beside  by Dr.Foster Cardioversion was done with synchronized biphasic defibrillation with AP pads with 150watts.  The patient converted to normal sinus rhythm. The patient tolerated the procedure well   IMPRESSION:  Successful cardioversion of atrial fibrillation  The patient was transferred back to room in stable condition.     Ileigh Mettler 07/04/2019, 9:43 AM

## 2019-07-04 NOTE — Transfer of Care (Signed)
Immediate Anesthesia Transfer of Care Note  Patient: Steven Gray  Procedure(s) Performed: TRANSESOPHAGEAL ECHOCARDIOGRAM (TEE) (N/A ) CARDIOVERSION (N/A )  Patient Location: Endoscopy Unit  Anesthesia Type:MAC  Level of Consciousness: awake  Airway & Oxygen Therapy: Patient Spontanous Breathing and Patient connected to nasal cannula oxygen  Post-op Assessment: Report given to RN and Post -op Vital signs reviewed and stable  Post vital signs: Reviewed  Last Vitals:  Vitals Value Taken Time  BP 83/58 07/04/19 0950  Temp    Pulse 53 07/04/19 0952  Resp 14 07/04/19 0952  SpO2 97 % 07/04/19 0952  Vitals shown include unvalidated device data.  Last Pain:  Vitals:   07/04/19 0815  TempSrc: Temporal  PainSc: 0-No pain         Complications: No apparent anesthesia complications

## 2019-07-17 ENCOUNTER — Other Ambulatory Visit: Payer: Self-pay

## 2019-07-17 ENCOUNTER — Ambulatory Visit: Payer: Commercial Managed Care - PPO | Admitting: Internal Medicine

## 2019-07-17 ENCOUNTER — Encounter: Payer: Self-pay | Admitting: Internal Medicine

## 2019-07-17 VITALS — BP 118/77 | HR 53 | Ht 73.0 in | Wt 191.0 lb

## 2019-07-17 DIAGNOSIS — I5021 Acute systolic (congestive) heart failure: Secondary | ICD-10-CM | POA: Diagnosis not present

## 2019-07-17 DIAGNOSIS — I4819 Other persistent atrial fibrillation: Secondary | ICD-10-CM | POA: Diagnosis not present

## 2019-07-17 NOTE — Progress Notes (Signed)
Patient Care Team: Burnard Bunting, MD as PCP - General (Internal Medicine)   HPI  Steven Gray is a 69 y.o. male seen following presentation with congestive heart failure found to be in atrial fibrillation with a rapid rate and with a cardiomyopathy with depressed LV function.  Initial efforts at TEE cardioversion were complicated by the presence of left atrial clot.  He was also found to have severe left atrial enlargement.  4 weeks of anticoagulation resulted in resolution of the clot and he underwent cardioversion and feels much much better.  He is able to swim.  Run.  Ride his bike.  No bleeding.    He had called me because of bradycardia, heart rates in the 40s.  We stopped his dig.  Otherwise taking beta-blockers and losartan.  There are some concern in the chart about his alcohol intake.  He asks that "alcohol abuse "be removed from his record as is not true.  2 glasses of wine a day does not constitute an abuse  DATE TEST EF   1/21 Echo   30-35 % LAE-severe (58ml/m2)  *3/21 TEE 30-35 %         Date Cr K Hgb  2/21 1.36 4.4 15.8           Records and Results Reviewed   Past Medical History:  Diagnosis Date  . Acute systolic CHF (congestive heart failure) ([place]) 06/03/2019  . Alcohol abuse 06/03/2019  . Atrial fibrillation ([place]) 06/03/2019  . 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 Heart palpitations 11/30/2012  . Hyperlipidemia    does not take medication for this, denies  . Increased prostate specific antigen (PSA) velocity   . Right knee pain 12/07/2016  . Sleep apnea    wears CPAP    Past Surgical History:  Procedure Laterality Date  . CARDIOVERSION N/A 07/04/2019   Procedure: CARDIOVERSION;  Surgeon: Sueanne Margarita, MD;  Location: [facility] ENDOSCOPY;  Service: Cardiovascular;  Laterality: N/A;  . MENISCUS REPAIR    . TEE WITHOUT CARDIOVERSION N/A 06/05/2019   Procedure: TRANSESOPHAGEAL ECHOCARDIOGRAM (TEE);  Surgeon: Josue Hector, MD;  Location:  [facility] ENDOSCOPY;  Service: Cardiovascular;  Laterality: N/A;  . TEE WITHOUT CARDIOVERSION N/A 07/04/2019   Procedure: TRANSESOPHAGEAL ECHOCARDIOGRAM (TEE);  Surgeon: Sueanne Margarita, MD;  Location: [facility] ENDOSCOPY;  Service: Cardiovascular;  Laterality: N/A;    Current Meds  Medication Sig  . apixaban (ELIQUIS) 5 MG TABS tablet Take 1 tablet (5 mg total) by mouth 2 (two) times daily.  Marland Kitchen atorvastatin (LIPITOR) 40 MG tablet Take 1 tablet (40 mg total) by mouth daily at 6 PM. (Patient taking differently: Take 40 mg by mouth daily at 6 PM. )  . digoxin (LANOXIN) 0.125 MG tablet Take 1 tablet (0.125 mg total) by mouth daily. (Patient taking differently: Take 0.125 mg by mouth daily. )  . furosemide (LASIX) 20 MG tablet Take 1 tablet (20 mg total) by mouth daily.  Marland Kitchen losartan (COZAAR) 25 MG tablet Take 1 tablet (25 mg total) by mouth at bedtime. (Patient taking differently: Take 25 mg by mouth at bedtime. )  . zolpidem (AMBIEN) 10 MG tablet Take 3.3333 mg by mouth at bedtime as needed for sleep. 1/3 tablet    No Known Allergies    Review of Systems negative except from HPI and PMH  Physical Exam BP 118/77   Pulse (!) 53   Ht 6\' 1"  (1.854 m)   Wt 191 lb (86.6 kg)  SpO2 98%   BMI 25.20 kg/m  Well developed and well nourished in no acute distress HENT normal E scleral and icterus clear Neck Supple JVP flat; carotids brisk and full Clear to ausculation  Regular rate and rhythm, no murmurs gallops or rub Soft with active bowel sounds No clubbing cyanosis  Edema Alert and oriented, grossly normal motor and sensory function Skin Warm and Dry  ECG sinus at 53 Interval 17/09/47  Estimated Creatinine Clearance: 59.6 mL/min (A) (by C-G formula based on SCr of 1.36 mg/dL (H)).   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   The patient has held sinus rhythm  following cardioversion.  We will plan in 6-8 weeks to reassess LV function.  For now, we will continue him on losartan and beta-blocker; and indeed the data on resolved cardiomyopathy suggest that these are long-term beneficial.  I do not know whether those data apply to rate related myopathies.  I would suspect that they probably do.  I have intimated that long-term medication may be appropriate.  We also discussed the role of catheter ablation.  He would like to "put this all behind".  The information of an echo will inform strategies.  We will review them following his echo.  He is drinking considerably less.  I have changed the "alcohol abuse" to "alcohol use "   Current medicines are reviewed at length with the patient today .  The patient does not  have concerns regarding medicines except that he does not want to take it at all

## 2019-07-17 NOTE — Addendum Note (Signed)
Addended by: Alois Cliche on: 07/17/2019 09:51 AM   Modules accepted: Orders

## 2019-07-17 NOTE — Patient Instructions (Signed)
Medication Instructions:  Your physician recommends that you continue on your current medications as directed. Please refer to the Current Medication list given to you today. *If you need a refill on your cardiac medications before your next appointment, please call your pharmacy*   Lab Work: None ordered.  If you have labs (blood work) drawn today and your tests are completely normal, you will receive your results only by: Marland Kitchen MyChart Message (if you have MyChart) OR . A paper copy in the mail If you have any lab test that is abnormal or we need to change your treatment, we will call you to review the results.   Testing/Procedures: Your physician has requested that you have an echocardiogram. Echocardiography is a painless test that uses sound waves to create images of your heart. It provides your doctor with information about the size and shape of your heart and how well your heart's chambers and valves are working. This procedure takes approximately one hour. There are no restrictions for this procedure.     Follow-Up: At [facility], you and your health needs are our priority.  As part of our continuing mission to provide you with exceptional heart care, we have created designated Provider Care Teams.  These Care Teams include your primary Cardiologist (physician) and Advanced Practice Providers (APPs -  Physician Assistants and Nurse Practitioners) who all work together to provide you with the care you need, when you need it.  We recommend signing up for the patient portal called "MyChart".  Sign up information is provided on this After Visit Summary.  MyChart is used to connect with patients for Virtual Visits (Telemedicine).  Patients are able to view lab/test results, encounter notes, upcoming appointments, etc.  Non-urgent messages can be sent to your provider as well.   To learn more about what you can do with MyChart, go to [email or web address].    Your next appointment:    09/07/2019 at 3pm The format for your next appointment:   virtual  Provider:   Dr Graciela Husbands

## 2019-07-30 ENCOUNTER — Other Ambulatory Visit: Payer: Self-pay | Admitting: Internal Medicine

## 2019-08-01 NOTE — Telephone Encounter (Signed)
Pt's pharmacy is requested a refill on metoprolol. This medication was D/C off of pt's medication list. Does pt still supposed to be taking this medication? Please clarify

## 2019-08-03 ENCOUNTER — Other Ambulatory Visit: Payer: Self-pay

## 2019-08-03 MED ORDER — METOPROLOL SUCCINATE ER 50 MG PO TB24: 50.0000 mg | ORAL_TABLET | Freq: Every day | ORAL | 3 refills | Status: DC

## 2019-08-03 NOTE — Telephone Encounter (Signed)
Spoke with pt and advised per Dr Graciela Husbands to refill pt's Metoprolol Succinate 50mg  1 tablet daily by mouth #90/3RF sent to pharmacy on file.  Pt advised he should have a refill of Lasix still available to him at pharmacy.  Pt verbalizes understanding and agrees with current plan.

## 2019-08-28 ENCOUNTER — Other Ambulatory Visit: Payer: Self-pay

## 2019-08-28 ENCOUNTER — Ambulatory Visit (HOSPITAL_COMMUNITY): Payer: Commercial Managed Care - PPO | Attending: Internal Medicine

## 2019-08-28 DIAGNOSIS — I4819 Other persistent atrial fibrillation: Secondary | ICD-10-CM | POA: Insufficient documentation

## 2019-09-07 ENCOUNTER — Telehealth: Payer: Self-pay

## 2019-09-07 ENCOUNTER — Other Ambulatory Visit: Payer: Self-pay

## 2019-09-07 ENCOUNTER — Telehealth (INDEPENDENT_AMBULATORY_CARE_PROVIDER_SITE_OTHER): Payer: Commercial Managed Care - PPO | Admitting: Internal Medicine

## 2019-09-07 VITALS — HR 56 | Ht 73.0 in | Wt 187.0 lb

## 2019-09-07 DIAGNOSIS — I429 Cardiomyopathy, unspecified: Secondary | ICD-10-CM

## 2019-09-07 DIAGNOSIS — I4819 Other persistent atrial fibrillation: Secondary | ICD-10-CM

## 2019-09-07 DIAGNOSIS — I517 Cardiomegaly: Secondary | ICD-10-CM | POA: Diagnosis not present

## 2019-09-07 MED ORDER — METOPROLOL SUCCINATE ER 50 MG PO TB24: 25.0000 mg | ORAL_TABLET | Freq: Every day | ORAL | 3 refills | Status: DC

## 2019-09-07 NOTE — Patient Instructions (Addendum)
Medication Instructions:  Your physician has recommended you make the following change in your medication:   Stop taking Furosemide  Decrease your Metoprolol Succinate from 50mg  (1 tablet daily by mouth) to 25mg  (1/2 tablet by mouth daily)  *If you need a refill on your cardiac medications before your next appointment, please call your pharmacy*   Lab Work: None ordered.  If you have labs (blood work) drawn today and your tests are completely normal, you will receive your results only by: MyChart Message (if you have MyChart) OR . A paper copy in the mail If you have any lab test that is abnormal or we need to change your treatment, we will call you to review the results.   Testing/Procedures: Your physician has requested that you have a cardiac MRI. Cardiac MRI uses a computer to create images of your heart as its beating, producing both still and moving pictures of your heart and major blood vessels. For further information please visit . Please follow the instruction sheet given to you today for more information.    Follow-Up: At [facility], you and your health needs are our priority.  As part of our continuing mission to provide you with exceptional heart care, we have created designated Provider Care Teams.  These Care Teams include your primary Cardiologist (physician) and Advanced Practice Providers (APPs -  Physician Assistants and Nurse Practitioners) who all work together to provide you with the care you need, when you need it.  We recommend signing up for the patient portal called "MyChart".  Sign up information is provided on this After Visit Summary.  MyChart is used to connect with patients for Virtual Visits (Telemedicine).  Patients are able to view lab/test results, encounter notes, upcoming appointments, etc.  Non-urgent messages can be sent to your provider as well.   To learn more about what you can do with MyChart, go to  [email or web address].    Your next appointment:   6 months with Dr [facility]     Instructions sent via MyChart and reviewed with pt via phone call.  Pt verbalizes understanding

## 2019-09-07 NOTE — Telephone Encounter (Signed)
  Patient Consent for Virtual Visit         Steven Gray [facility] has provided verbal consent on 09/07/2019 for a virtual visit (video or telephone).   CONSENT FOR VIRTUAL VISIT FOR:  Steven Gray  By participating in this virtual visit I agree to the following:  I hereby voluntarily request, consent and authorize [facility] and its employed or contracted physicians, physician assistants, nurse practitioners or other licensed health care professionals (the Practitioner), to provide me with telemedicine health care services (the "Services") as deemed necessary by the treating Practitioner. I acknowledge and consent to receive the Services by the Practitioner via telemedicine. I understand that the telemedicine visit will involve communicating with the Practitioner through live audiovisual communication technology and the disclosure of certain medical information by electronic transmission. I acknowledge that I have been given the opportunity to request an in-person assessment or other available alternative prior to the telemedicine visit and am voluntarily participating in the telemedicine visit.  I understand that I have the right to withhold or withdraw my consent to the use of telemedicine in the course of my care at any time, without affecting my right to future care or treatment, and that the Practitioner or I may terminate the telemedicine visit at any time. I understand that I have the right to inspect all information obtained and/or recorded in the course of the telemedicine visit and may receive copies of available information for a reasonable fee.  I understand that some of the potential risks of receiving the Services via telemedicine include:  Marland Kitchen Delay or interruption in medical evaluation due to technological equipment failure or disruption; . Information transmitted may not be sufficient (e.g. poor resolution of images) to allow for appropriate medical decision making by the  Practitioner; and/or  . In rare instances, security protocols could fail, causing a breach of personal health information.  Furthermore, I acknowledge that it is my responsibility to provide information about my medical history, conditions and care that is complete and accurate to the best of my ability. I acknowledge that Practitioner's advice, recommendations, and/or decision may be based on factors not within their control, such as incomplete or inaccurate data provided by me or distortions of diagnostic images or specimens that may result from electronic transmissions. I understand that the practice of medicine is not an exact science and that Practitioner makes no warranties or guarantees regarding treatment outcomes. I acknowledge that a copy of this consent can be made available to me via my patient portal [facility] MyChart), or I can request a printed copy by calling the office of [facility].    I understand that my insurance will be billed for this visit.   I have read or had this consent read to me. . I understand the contents of this consent, which adequately explains the benefits and risks of the Services being provided via telemedicine.  . I have been provided ample opportunity to ask questions regarding this consent and the Services and have had my questions answered to my satisfaction. . I give my informed consent for the services to be provided through the use of telemedicine in my medical care

## 2019-09-07 NOTE — Progress Notes (Signed)
Electrophysiology TeleHealth Note   Due to national recommendations of social distancing due to COVID 19, an audio/video telehealth visit is felt to be most appropriate for this patient at this time.  See MyChart message from today for the patient's consent to telehealth for [facility].   Date:  09/07/2019   ID:  Steven Gray, DOB Sep 26, 1950, MRN [id number]  Location: patient's home  Provider location: [address], [place]  Evaluation Performed: Follow-up visit  PCP:  Burnard Bunting, MD  Cardiologist:     Electrophysiologist:  SK   Chief Complaint:  Afib   History of Present Illness:    Steven Gray is a 69 y.o. male who presents via audio/video conferencing for a telehealth visit today.  Since last being seen in our clinic forAFib assoc with LV dysfunction and LA clot,  the patient reports he has undergone DCCV and feels great  -- increased exercise  Less Et OH    DATE TEST EF   1/21 Echo   30-35 % LAE-severe (18ml/m2)  *3/21 TEE 30-35 %   4/21 Echo 60-65% LVH mild BAE(LA 58cc/m2)   Date Cr K Hgb  2/21 1.36 4.4 15.8            The patient denies symptoms of fevers, chills, cough, or new SOB worrisome for COVID 19.    Past Medical History:  Diagnosis Date  . Acute systolic CHF (congestive heart failure) ([place]) 06/03/2019  . Alcohol use 06/03/2019  . Atrial fibrillation ([place]) 06/03/2019  . 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 Heart palpitations 11/30/2012  . Hyperlipidemia    does not take medication for this, denies  . Increased prostate specific antigen (PSA) velocity   . Right knee pain 12/07/2016  . Sleep apnea    wears CPAP    Past Surgical History:  Procedure Laterality Date  . CARDIOVERSION N/A 07/04/2019   Procedure: CARDIOVERSION;  Surgeon: Sueanne Margarita, MD;  Location: [facility] ENDOSCOPY;  Service: Cardiovascular;  Laterality: N/A;  . MENISCUS REPAIR    . TEE WITHOUT CARDIOVERSION N/A 06/05/2019   Procedure:  TRANSESOPHAGEAL ECHOCARDIOGRAM (TEE);  Surgeon: Josue Hector, MD;  Location: [facility] ENDOSCOPY;  Service: Cardiovascular;  Laterality: N/A;  . TEE WITHOUT CARDIOVERSION N/A 07/04/2019   Procedure: TRANSESOPHAGEAL ECHOCARDIOGRAM (TEE);  Surgeon: Sueanne Margarita, MD;  Location: [facility] ENDOSCOPY;  Service: Cardiovascular;  Laterality: N/A;    Current Outpatient Medications  Medication Sig Dispense Refill  . atorvastatin (LIPITOR) 40 MG tablet TAKE 1 TABLET(40 MG) BY MOUTH DAILY AT 6 PM 30 tablet 11  . losartan (COZAAR) 25 MG tablet TAKE 1 TABLET(25 MG) BY MOUTH AT BEDTIME 30 tablet 11  . metoprolol succinate (TOPROL-XL) 50 MG 24 hr tablet Take 1 tablet (50 mg total) by mouth daily. Take with or immediately following a meal. 90 tablet 3  . zolpidem (AMBIEN) 10 MG tablet Take 3.3333 mg by mouth at bedtime as needed for sleep. 1/3 tablet    . apixaban (ELIQUIS) 5 MG TABS tablet Take 1 tablet (5 mg total) by mouth 2 (two) times daily. 60 tablet 5  . furosemide (LASIX) 20 MG tablet Take 1 tablet (20 mg total) by mouth daily. 90 tablet 3   No current facility-administered medications for this visit.    Allergies:   Patient has no known allergies.   Social History:  The patient  reports that he has never smoked. He has never used smokeless tobacco. He reports previous alcohol  use. He reports that he does not use drugs.   Family History:  The patient's   family history includes CAD in his maternal grandfather; Polycystic kidney disease in his mother; Vascular Disease in his father.   ROS:  Please see the history of present illness.   All other systems are personally reviewed and negative.    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   Continue Apixoban   Will reduce metoprolol 50>>25  Qd And stop furosemide   Has reduced alcohol intake and increased exercise  Discussed the discrepancy in LA volume measurements   Will have PR review  Given LA enlargement notes as severe on TEE and TTE,the causal relation of afib and Atrial size remains to be clarified   If the LA were solely secondary to the afib, I would have expected LA size to improve, although the time course for this is not so clear-- His LV EF improved rapidly.  This raises the spectre that the LA enlargement was secondary to a cardiomyopathic process and the afib was 2/2 to the LA size not vice versa  Will get cMRI to clarify  He has mild LVH and MRI will be helpful in clarifying this  Discussed also the likely recommendation for long term BB and ARB given his resolved cardiomyopathy.. data has shown that discontinuation of these drugs in NICM ( not necessarily rate related) is assoc with increased risk of worsening LV function    COVID 19 screen The patient denies symptoms of COVID 19 at this time.  The importance of social distancing was discussed today.  Follow-up: 99m     Current medicines are reviewed at length with the patient today.   The patient has concerns regarding his medicines.  The following changes were made today:  Stop furosimide, and reduce metoprolol  Labs/ tests ordered today include: cMRI for cardiomyopathy No orders of the defined types were placed in this encounter.   Future tests ( post COVID )     Patient  Risk:  after full review of this patients clinical status, I feel that they are at moderate risk at this time.  Today, I have spent 14* minutes with the patient with telehealth technology discussing the above.  Signed, 11m, MD  09/07/2019 2:58 PM     [facility] [address] [place] [phone] (office) [phone] (fax)

## 2019-09-08 ENCOUNTER — Telehealth: Payer: Self-pay | Admitting: Internal Medicine

## 2019-09-08 NOTE — Telephone Encounter (Signed)
Left message for patient to call regarding Cardiac MRI ordered by Dr. Graciela Husbands

## 2019-09-13 ENCOUNTER — Encounter: Payer: Self-pay | Admitting: *Deleted

## 2019-09-13 NOTE — Telephone Encounter (Signed)
Spoke with patient regarding appointment for Cardiac MRI scheduled Friday 10/27/19 at 8:00 am---arrival time is 7:15 am 1st floor admissions office at [facility].  WIil mail information to patient and it is available in My Chart.  Patient voiced his understanding.

## 2019-10-26 ENCOUNTER — Telehealth (HOSPITAL_COMMUNITY): Payer: Self-pay | Admitting: *Deleted

## 2019-10-26 NOTE — Telephone Encounter (Signed)
Reaching out to patient to offer assistance regarding upcoming cardiac imaging study; pt verbalizes understanding of appt date/time, parking situation and where to check in, and verified current allergies; name and call back number provided for further questions should they arise  [facility] [phone] office [phone] cell

## 2019-10-26 NOTE — Telephone Encounter (Signed)
Attempted to call patient regarding upcoming cardiac MRI appointment. Left message on voicemail with name and callback number  Lizzie Cokley Tai RN Navigator Cardiac Imaging [facility] [phone] Office [phone] Cell  

## 2019-10-27 ENCOUNTER — Other Ambulatory Visit: Payer: Self-pay

## 2019-10-27 ENCOUNTER — Ambulatory Visit (HOSPITAL_COMMUNITY)
Admission: RE | Admit: 2019-10-27 | Discharge: 2019-10-27 | Disposition: A | Discharge status: Home / Self Care | Payer: Commercial Managed Care - PPO | Source: Ambulatory Visit | Attending: Internal Medicine | Admitting: Internal Medicine

## 2019-10-27 DIAGNOSIS — I517 Cardiomegaly: Secondary | ICD-10-CM | POA: Diagnosis present

## 2019-10-27 DIAGNOSIS — I429 Cardiomyopathy, unspecified: Secondary | ICD-10-CM

## 2019-10-27 DIAGNOSIS — I4819 Other persistent atrial fibrillation: Secondary | ICD-10-CM | POA: Diagnosis not present

## 2019-10-27 IMAGING — MR MR CARD MORPHOLOGY WO/W CM
45 of 48 series · 45 of 48 positions shown · IV contrast (Contrast agent)
Comparison: none

CLINICAL DATA: Cardiomyopathy

EXAM:
CARDIAC MRI
TECHNIQUE: The patient was scanned on a 1.5 Tesla GE magnet. A dedicated
cardiac coil was used. Functional imaging was done using Fiesta
sequences. [DATE], and 4 chamber views were done to assess for RWMA's.
Modified TALHA rule using a short axis stack was used to
calculate an ejection fraction on a dedicated work station using
Circle software. The patient received 10 cc of Gadavist. After 10
minutes inversion recovery sequences were used to assess for
infiltration and scar tissue.
CONTRAST:  10 cc Gadavist

[Series 4: t2_[id number]_tra_bh · axial · 8.0mm · 1.33mm/px · 1 of 16 slices shown]
[im 1/16]
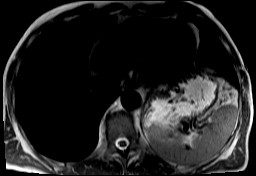

[Series 10: bSSFP · oblique · 8.0mm · 1.61mm/px · 1 of 25 slices shown (1 of 27)]
[im 1/25]
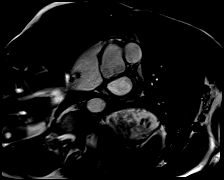

[Series 11: bSSFP · oblique · 8.0mm · 1.61mm/px · 1 of 25 slices shown (2 of 27)]
[im 1/25]
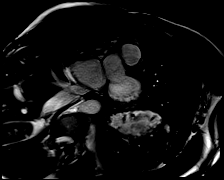

[Series 12: bSSFP · oblique · 8.0mm · 1.61mm/px · 1 of 25 slices shown (3 of 27)]
[im 1/25]
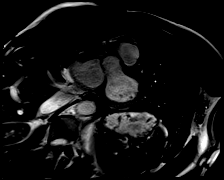

[Series 13: bSSFP · oblique · 8.0mm · 1.61mm/px · 1 of 25 slices shown (4 of 27)]
[im 1/25]
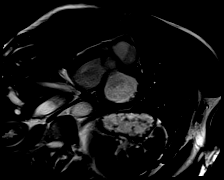

[Series 14: bSSFP · oblique · 8.0mm · 1.61mm/px · 1 of 25 slices shown (5 of 27)]
[im 1/25]
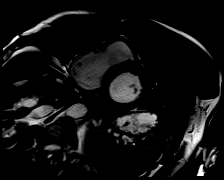

[Series 15: bSSFP · oblique · 8.0mm · 1.61mm/px · 1 of 25 slices shown (6 of 27)]
[im 1/25]
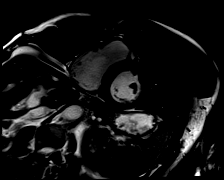

[Series 16: bSSFP · oblique · 8.0mm · 1.61mm/px · 1 of 25 slices shown (7 of 27)]
[im 1/25]
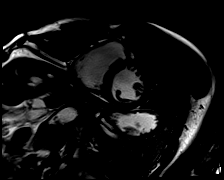

[Series 17: bSSFP · oblique · 8.0mm · 1.61mm/px · 1 of 25 slices shown (8 of 27)]
[im 1/25]
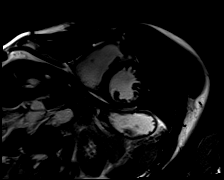

[Series 18: bSSFP · oblique · 8.0mm · 1.61mm/px · 1 of 25 slices shown (9 of 27)]
[im 1/25]
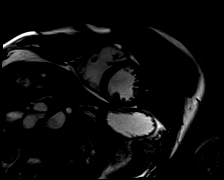

[Series 19: bSSFP · oblique · 8.0mm · 1.61mm/px · 1 of 25 slices shown (10 of 27)]
[im 1/25]
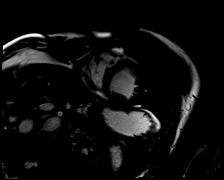

[Series 20: bSSFP · oblique · 8.0mm · 1.61mm/px · 1 of 25 slices shown (11 of 27)]
[im 1/25]
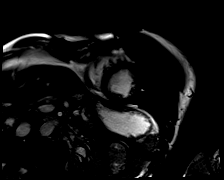

[Series 21: bSSFP · oblique · 8.0mm · 1.61mm/px · 1 of 25 slices shown (12 of 27)]
[im 1/25]
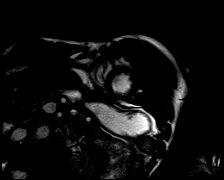

[Series 22: bSSFP · oblique · 8.0mm · 1.61mm/px · 1 of 25 slices shown (13 of 27)]
[im 1/25]
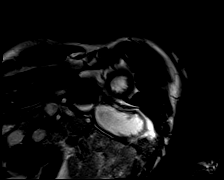

[Series 23: bSSFP · oblique · 8.0mm · 1.61mm/px · 1 of 25 slices shown (14 of 27)]
[im 1/25]
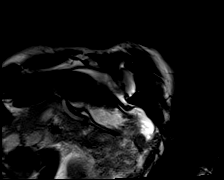

[Series 24: bSSFP · oblique · 8.0mm · 1.61mm/px · 1 of 25 slices shown (15 of 27)]
[im 1/25]
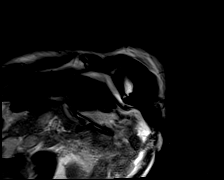

[Series 25: bSSFP · oblique · 8.0mm · 1.61mm/px · 1 of 25 slices shown (16 of 27)]
[im 1/25]
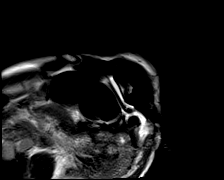

[Series 26: bSSFP · oblique · 8.0mm · 1.61mm/px · 1 of 25 slices shown (17 of 27)]
[im 1/25]
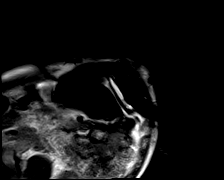

[Series 27: bSSFP · oblique · 8.0mm · 1.61mm/px · 1 of 25 slices shown (18 of 27)]
[im 1/25]
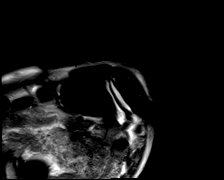

[Series 28: bSSFP · oblique · 8.0mm · 1.61mm/px · 1 of 25 slices shown (19 of 27)]
[im 1/25]
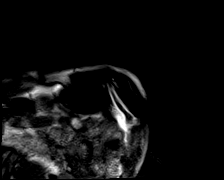

[Series 29: (id)_long_t1 · oblique · 8.0mm · 1.41mm/px · 1 of 24 slices shown]
[im 1/24]
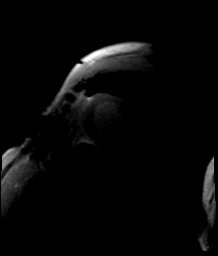

[Series 30: (id)_long_t1_moco · oblique · 8.0mm · 1.41mm/px · 1 of 24 slices shown]
[im 1/24]
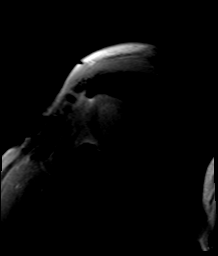

[Series 31: (id)_long_t1_moco_t1 · oblique · 8.0mm · 1.41mm/px · 1 of 6 slices shown]
[im 1/6]
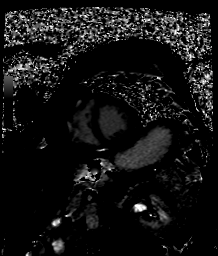

[Series 33: (id)_trufi · oblique · 8.0mm · 1.88mm/px · 1 of 9 slices shown]
[im 1/9]
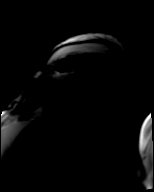

[Series 34: (id)_trufi_moco · oblique · 8.0mm · 1.88mm/px · 1 of 9 slices shown]
[im 1/9]
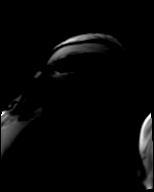

[Series 35: (id)_trufi_moco_t2 · oblique · 8.0mm · 1.88mm/px · 1 of 3 slices shown]
[im 1/3]
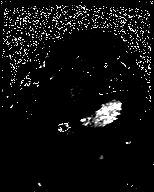

[Series 37: bSSFP · axial · 6.0mm · 1.41mm/px · 1 of 25 slices shown (20 of 27)]
[im 1/25]
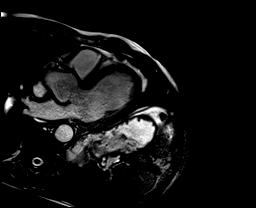

[Series 38: bSSFP · oblique · 6.0mm · 1.41mm/px · 1 of 25 slices shown (21 of 27)]
[im 1/25]
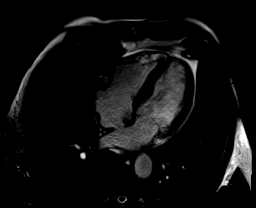

[Series 39: bSSFP · sagittal · 6.0mm · 1.41mm/px · 1 of 25 slices shown (22 of 27)]
[im 1/25]
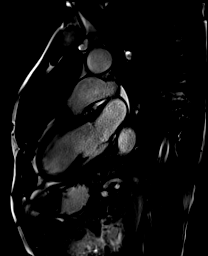

[Series 40: STIR · oblique · 8.0mm · 1.73mm/px · 1 of 17 slices shown]
[im 1/17]
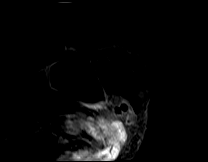

[Series 41: bSSFP · coronal · 6.0mm · 1.41mm/px · 1 of 25 slices shown (23 of 27)]
[im 1/25]
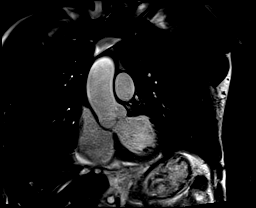

[Series 42: aortic valve cine · oblique · 6.0mm · 1.41mm/px · 1 of 25 slices shown]
[im 1/25]
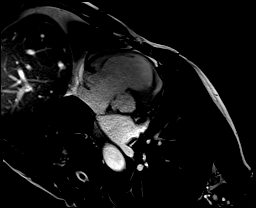

[Series 43: cine rvit · oblique · 6.0mm · 1.41mm/px · 1 of 25 slices shown]
[im 1/25]
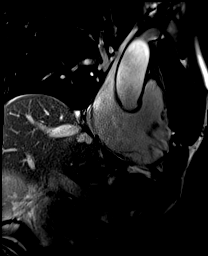

[Series 44: cine rvot · sagittal · 6.0mm · 1.41mm/px · 1 of 25 slices shown]
[im 1/25]
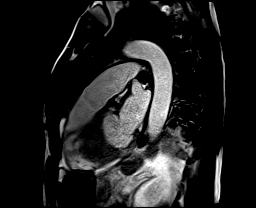

[Series 46: lge_single shot sa · oblique · 8.0mm · 1.98mm/px · 1 of 10 slices shown (1 of 2)]
[im 1/10]
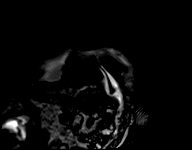

[Series 47: lge_single shot sa · oblique · 8.0mm · 1.98mm/px · 1 of 10 slices shown (2 of 2)]
[im 1/10]
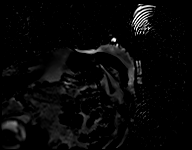

[Series 52: lge_single shot 3 · axial · 6.0mm · 1.98mm/px · 1 of 1 slices shown (1 of 2)]
[im 1/1]
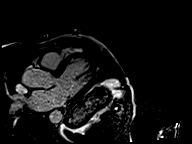

[Series 53: lge_single shot 3 · axial · 6.0mm · 1.98mm/px · 1 of 1 slices shown (2 of 2)]
[im 1/1]
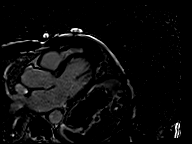

[Series 54: (id)_short_t1 · oblique · 8.0mm · 1.41mm/px · 1 of 27 slices shown]
[im 1/27]
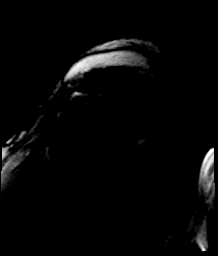

[Series 55: (id)_short_t1_moco · oblique · 8.0mm · 1.41mm/px · 1 of 27 slices shown]
[im 1/27]
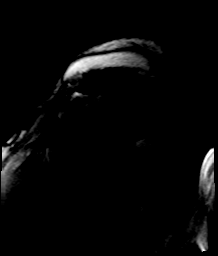

[Series 56: (id)_short_t1_moco_t1 · oblique · 8.0mm · 1.41mm/px · 1 of 6 slices shown]
[im 1/6]
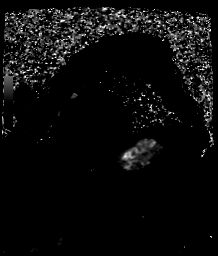

[Series 59: bSSFP · oblique · 6.0mm · 1.73mm/px · 1 of 19 slices shown (24 of 27)]
[im 1/19]
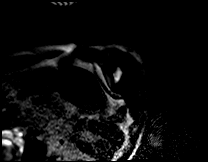

[Series 60: bSSFP · oblique · 6.0mm · 1.73mm/px · 1 of 19 slices shown (25 of 27)]
[im 1/19]
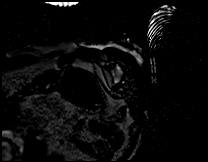

[Series 65: bSSFP · axial · 6.0mm · 1.73mm/px · 1 of 1 slices shown (26 of 27)]
[im 1/1]
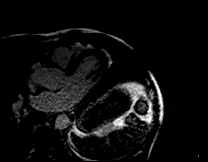

[Series 66: bSSFP · axial · 6.0mm · 1.73mm/px · 1 of 1 slices shown (27 of 27)]
[im 1/1]
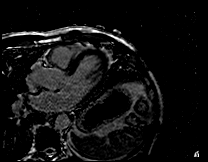

[45 of 48 positions shown; findings below may reference images not displayed]

FINDINGS: Limited images of the lung fields showed no gross abnormalities.

The aortic root is mildly dilated to 4.2 cm at the sinuses of
Valsalva. Normal left ventricular size and wall thickness. Normal
wall motion with EF 62%. Normal right ventricular size and systolic
function, EF 52%. Moderate left atrial enlargement, mild right
atrial enlargement. The aortic valve was not fully visualized but
probably trileaflet with no significant stenosis or regurgitation.
Mild tricuspid regurgitation. There does not appear to be more than
trivial mitral regurgitation.

On delayed enhancement imaging, there was no myocardial late
gadolinium enhancement (LGE).

Measurements:

LVEDV 193 mL
LVSV 120 mL
LVEF 62%

RVEDV 185 mL
RVSV 97 mL
RVEF 52%
IMPRESSION: 1.  Normal LV size and systolic function, EF 62%.

2.  Normal RV size and systolic function, EF 52%.

3. No myocardial LGE, so no definitive evidence for prior MI,
infiltrative disease, or myocarditis.

4.  Biatrial enlargement.

5.  Mildly dilated aortic root.

TALHA

## 2019-10-27 MED ORDER — GADOBUTROL 1 MMOL/ML IV SOLN
10.0000 mL | Freq: Once | INTRAVENOUS | Status: AC | PRN
  Administered 2019-10-27: 10 mL via INTRAVENOUS

## 2019-11-23 ENCOUNTER — Telehealth: Payer: Self-pay

## 2019-11-23 NOTE — Telephone Encounter (Signed)
Spoke with pt who states his watch is telling him he is in Afib.  Pt denies symptoms of CP, SOB or dizziness at this time.  Pt reports his HR is 69.  Per Dr Graciela Husbands, offered pt the option of going to ED to be seen and possible Cardioversion or may be seen in Afib clinic and scheduled for cardioversion.  Pt requests appointment with Afib clinic.  Appointment scheduled for Monday 11/27/2019 at 830am.  Parking code provided to pt.  Pt advised to take medications as prescribed.  If he develops symptoms of CP, SOB, dizziness or rapid heart rate he is to go directly to the ED.  Pt verbalizes understanding and agrees with current plan.  Dr Graciela Husbands notified of plan.

## 2019-11-27 ENCOUNTER — Other Ambulatory Visit: Payer: Self-pay

## 2019-11-27 ENCOUNTER — Ambulatory Visit (HOSPITAL_COMMUNITY)
Admission: RE | Admit: 2019-11-27 | Discharge: 2019-11-27 | Disposition: A | Discharge status: Home / Self Care | Payer: Commercial Managed Care - PPO | Source: Ambulatory Visit | Attending: Physician Assistant | Admitting: Physician Assistant

## 2019-11-27 VITALS — BP 120/70 | HR 72 | Ht 73.0 in | Wt 190.6 lb

## 2019-11-27 DIAGNOSIS — Z7901 Long term (current) use of anticoagulants: Secondary | ICD-10-CM | POA: Diagnosis not present

## 2019-11-27 DIAGNOSIS — I4819 Other persistent atrial fibrillation: Secondary | ICD-10-CM | POA: Diagnosis present

## 2019-11-27 DIAGNOSIS — Z79899 Other long term (current) drug therapy: Secondary | ICD-10-CM | POA: Insufficient documentation

## 2019-11-27 DIAGNOSIS — I5021 Acute systolic (congestive) heart failure: Secondary | ICD-10-CM | POA: Insufficient documentation

## 2019-11-27 DIAGNOSIS — G4733 Obstructive sleep apnea (adult) (pediatric): Secondary | ICD-10-CM | POA: Insufficient documentation

## 2019-11-27 DIAGNOSIS — D6869 Other thrombophilia: Secondary | ICD-10-CM | POA: Insufficient documentation

## 2019-11-27 LAB — CBC
HCT: 44.7 % (ref 39.0–52.0)
Hemoglobin: 14.9 g/dL (ref 13.0–17.0)
MCH: 33.3 pg (ref 26.0–34.0)
MCHC: 33.3 g/dL (ref 30.0–36.0)
MCV: 100 fL (ref 80.0–100.0)
Platelets: 178 10*3/uL (ref 150–400)
RBC: 4.47 MIL/uL (ref 4.22–5.81)
RDW: 11.6 % (ref 11.5–15.5)
WBC: 7.8 10*3/uL (ref 4.0–10.5)
nRBC: 0 % (ref 0.0–0.2)

## 2019-11-27 LAB — BASIC METABOLIC PANEL
Anion gap: 6 (ref 5–15)
BUN: 17 mg/dL (ref 8–23)
CO2: 23 mmol/L (ref 22–32)
Calcium: 9.5 mg/dL (ref 8.9–10.3)
Chloride: 107 mmol/L (ref 98–111)
Creatinine, Ser: 1.19 mg/dL (ref 0.61–1.24)
GFR calc Af Amer: >60 mL/min (ref >60)
GFR calc non Af Amer: >60 mL/min (ref >60)
Glucose, Bld: 101 mg/dL — ABNORMAL HIGH (ref 70–99)
Potassium: 4 mmol/L (ref 3.5–5.1)
Sodium: 136 mmol/L (ref 135–145)

## 2019-11-27 NOTE — Progress Notes (Signed)
Primary Care Physician: Geoffry Paradise, MD Primary Electrophysiologist: Dr Graciela Husbands Referring Physician: Francis Dowse   Steven Gray is a 69 y.o. male with a history of HLD, OSA, persistent atrial fibrillation, systolic CHF  who presents for follow up in the [facility].  The patient was initially diagnosed with atrial fibrillation 06/03/19 after presenting to the ER with a 69-month onset of worsening dyspnea, orthopnea and PND.  Patient was previously seen by Dr. Excell Seltzer in 2014 for palpitation.  ETT at the time was low risk.  Heart monitor showed sinus rhythm with PAC and PVC, otherwise no significant arrhythmia. On arrival to the ED at [facility] on 06/03/2019, he was noted to be in new atrial fibrillation with RVR with heart rate of 110-120s. Echo showed reduced EF 30-35% with a severely dilated LA. Patient was started on Eliquis for a CHADS2VASC score of 2. Plan was for patient to undergo TEE/DCCV but TEE showed dense smoke and LA thrombus and so DCCV was not attempted. He was discharged on Toprol and digoxin for rate control.   On follow up today, patient underwent TEE guided DCCV on 07/04/19 which was successful. Repeat echo and cardiac MRI showed normalized EF with severe biatrial enlargement. Patient had done well until last week when his smart watch again showed afib. He reports good rate control and has been asymptomatic.   Today, he denies symptoms of palpitations, chest pain, orthopnea, PND, lower extremity edema, dizziness, presyncope, syncope, bleeding, or neurologic sequela. The patient is tolerating medications without difficulties and is otherwise without complaint today.    Atrial Fibrillation Risk Factors:  he does have symptoms or diagnosis of sleep apnea. he is compliant with CPAP therapy. he does not have a history of rheumatic fever. he does have a history of alcohol use. The patient does not have a history of early familial atrial  fibrillation or other arrhythmias.  he has a BMI of Body mass index is 25.15 kg/m.Marland Kitchen Filed Weights   11/27/19 0846  Weight: 86.5 kg    Family History  Problem Relation Age of Onset  . Polycystic kidney disease Mother   . Vascular Disease Father        vascular dementia  . CAD Maternal Grandfather   . Atrial fibrillation Neg Hx      Atrial Fibrillation Management history:  Previous antiarrhythmic drugs: none Previous cardioversions: 07/04/19 Previous ablations: none CHADS2VASC score: 2 Anticoagulation history: Eliquis   Past Medical History:  Diagnosis Date  . Acute systolic CHF (congestive heart failure) ([facility]) 06/03/2019  . Alcohol use 06/03/2019  . Atrial fibrillation ([facility]) 06/03/2019  . 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 Heart palpitations 11/30/2012  . Hyperlipidemia    does not take medication for this, denies  . Increased prostate specific antigen (PSA) velocity   . Right knee pain 12/07/2016  . Sleep apnea    wears CPAP   Past Surgical History:  Procedure Laterality Date  . CARDIOVERSION N/A 07/04/2019   Procedure: CARDIOVERSION;  Surgeon: Quintella Reichert, MD;  Location[facility] ENDOSCOPY;  Service: Cardiovascular;  Laterality: N/A;  . MENISCUS REPAIR    . TEE WITHOUT CARDIOVERSION N/A 06/05/2019   Procedure: TRANSESOPHAGEAL ECHOCARDIOGRAM (TEE);  Surgeon: Wendall Stade, MD;  Location: [facility] ENDOSCOPY;  Service: Cardiovascular;  Laterality: N/A;  . TEE WITHOUT CARDIOVERSION N/A 07/04/2019   Procedure: TRANSESOPHAGEAL ECHOCARDIOGRAM (TEE);  Surgeon: Quintella Reichert, MD;  Location: [facility] ENDOSCOPY;  Service: Cardiovascular;  Laterality: N/A;  Current Outpatient Medications  Medication Sig Dispense Refill  . apixaban (ELIQUIS) 5 MG TABS tablet Take 1 tablet (5 mg total) by mouth 2 (two) times daily. 60 tablet 5  . atorvastatin (LIPITOR) 40 MG tablet TAKE 1 TABLET(40 MG) BY MOUTH DAILY AT 6 PM 30 tablet 11  . losartan (COZAAR) 25 MG tablet TAKE 1 TABLET(25 MG) BY  MOUTH AT BEDTIME 30 tablet 11  . metoprolol succinate (TOPROL-XL) 50 MG 24 hr tablet Take 0.5 tablets (25 mg total) by mouth daily. Take with or immediately following a meal. 45 tablet 3  . zolpidem (AMBIEN) 10 MG tablet Take 3.3333 mg by mouth at bedtime as needed for sleep. 1/3 tablet     No current facility-administered medications for this encounter.    Allergies  Allergen Reactions  . Other     Cats    Social History   Socioeconomic History  . Marital status: Married    Spouse name: Not on file  . Number of children: Not on file  . Years of education: Not on file  . Highest education level: Not on file  Occupational History  . Occupation: warehousing  Tobacco Use  . Smoking status: Never Smoker  . Smokeless tobacco: Never Used  Substance and Sexual Activity  . Alcohol use: Not Currently    Comment: 15+ drinks/week wine  . Drug use: No  . Sexual activity: Not on file  Other Topics Concern  . Not on file  Social History Narrative   Married with 2 sons, nonsmoker, social ETOH, works as Psychologist, educational in Geologist, engineering. Frequent exercise.   Social Determinants of Health   Financial Resource Strain:   . Difficulty of Paying Living Expenses:   Food Insecurity:   . Worried About Programme researcher, broadcasting/film/video in the Last Year:   . Barista in the Last Year:   Transportation Needs:   . Freight forwarder (Medical):   Marland Kitchen Lack of Transportation (Non-Medical):   Physical Activity:   . Days of Exercise per Week:   . Minutes of Exercise per Session:   Stress:   . Feeling of Stress :   Social Connections:   . Frequency of Communication with Friends and Family:   . Frequency of Social Gatherings with Friends and Family:   . Attends Religious Services:   . Active Member of Clubs or Organizations:   . Attends Banker Meetings:   Marland Kitchen Marital Status:   Intimate Partner Violence:   . Fear of Current or Ex-Partner:   . Emotionally Abused:   Marland Kitchen Physically Abused:    . Sexually Abused:      ROS- All systems are reviewed and negative except as per the HPI above.  Physical Exam: Vitals:   11/27/19 0846  BP: 120/70  Pulse: 72  Weight: 86.5 kg  Height: 6\' 1"  (1.854 m)    GEN- The patient is well appearing, alert and oriented x 3 today.   HEENT-head normocephalic, atraumatic, sclera clear, conjunctiva pink, hearing intact, trachea midline. Lungs- Clear to ausculation bilaterally, normal work of breathing Heart- irregular rate and rhythm, no murmurs, rubs or gallops  GI- soft, NT, ND, + BS Extremities- no clubbing, cyanosis, or edema MS- no significant deformity or atrophy Skin- no rash or lesion Psych- euthymic mood, full affect Neuro- strength and sensation are intact   Wt Readings from Last 3 Encounters:  11/27/19 86.5 kg  09/07/19 84.8 kg  07/17/19 86.6 kg    EKG today demonstrates  afib HR 72, QRS 90, QTc 429  Echo 06/03/19 demonstrated  1. Left ventricular ejection fraction, by visual estimation, is 30 to  35%. The left ventricle has moderate to severely decreased function. There  is no left ventricular hypertrophy.  2. Left ventricular diastolic parameters are indeterminate.  3. The left ventricle demonstrates global hypokinesis.  4. Atrial fibrillation makes accurate assessment of LVEF diffciult.  5. Global right ventricle has moderately reduced systolic function.The  right ventricular size is normal. No increase in right ventricular wall  thickness.  6. Left atrial size was severely dilated.  7. Right atrial size was normal.  8. The mitral valve is abnormal. Mild mitral valve regurgitation.  9. The tricuspid valve is normal in structure.  10. The tricuspid valve is normal in structure. Tricuspid valve  regurgitation is moderate.  11. The aortic valve is grossly normal. Aortic valve regurgitation is not  visualized.  12. Pulmonic regurgitation is mild.  13. The pulmonic valve was normal in structure. Pulmonic valve   regurgitation is mild.  14. Aortic dilatation noted.  15. There is mild dilatation of the ascending aorta.  16. Moderately elevated pulmonary artery systolic pressure.  17. The interatrial septum was not well visualized.   Epic records are reviewed at length today  CHA2DS2-VASc Score = 2  The patient's score is based upon: CHF History: 1 HTN History: 0 Age : 1 Diabetes History: 0 Stroke History: 0 Vascular Disease History: 0 Gender: 0      ASSESSMENT AND PLAN: 1. Persistent Atrial Fibrillation (ICD10:  I48.19) The patient's CHA2DS2-VASc score is 2, indicating a 2.2% annual risk of stroke.   S/p TEE/DCCV on 07/04/19. He is in rate controlled afib today. We discussed therapeutic options today including AAD and DCCV. We specifically discussed dofetilide. At this time, he would like to try DCCV alone. He is agreeable to dofetilide admission if he should have early return of his afib. Given his severe biatrial enlargement, would try AAD before considering ablation.  Continue Eliquis 5 mg BID. Patient denies any missed doses in the last 3 weeks. Continue Toprol 12.5 mg daily Check bmet/CBC  2. Secondary Hypercoagulable State (ICD10:  D68.69) The patient is at significant risk for stroke/thromboembolism based upon his CHA2DS2-VASc Score of 2.  Continue Apixaban (Eliquis).     3. Obstructive sleep apnea Patient reports compliance with CPAP therapy.  4. Systolic dysfunction Suspect tachycardia mediated.  Resolved on cardiac MRI and echo after DCCV.   Follow up in the AF clinic one week post DCCV.   Jorja Loa PA-C Afib Clinic [facility] [address] [place], [place] [address] [phone] 11/27/2019 9:23 AM

## 2019-11-27 NOTE — Patient Instructions (Signed)
Cardioversion scheduled for Thursday, July 29th  - Arrive at the [facility] and go to admitting at [facility] not eat or drink anything after midnight the night prior to your procedure.  - Take all your morning medication (except diabetic medications) with a sip of water prior to arrival.  - You will not be able to drive home after your procedure.  - Do NOT miss any doses of your blood thinner - if you should miss a dose please notify our office immediately.

## 2019-11-28 ENCOUNTER — Other Ambulatory Visit (HOSPITAL_COMMUNITY)
Admission: RE | Admit: 2019-11-28 | Discharge: 2019-11-28 | Disposition: A | Discharge status: Home / Self Care | Payer: Commercial Managed Care - PPO | Source: Ambulatory Visit | Attending: Cardiology | Admitting: Cardiology

## 2019-11-28 DIAGNOSIS — Z01812 Encounter for preprocedural laboratory examination: Secondary | ICD-10-CM | POA: Insufficient documentation

## 2019-11-28 DIAGNOSIS — Z20822 Contact with and (suspected) exposure to covid-19: Secondary | ICD-10-CM | POA: Insufficient documentation

## 2019-11-28 LAB — SARS CORONAVIRUS 2 (TAT 6-24 HRS): SARS Coronavirus 2: NEGATIVE

## 2019-11-29 NOTE — Progress Notes (Signed)
Pre call done for cardioversion tomorrow. Patient states he has been quarantined at home since covid test, will be NPO with sips for meds in morning, and will take his blood thinner in am before arrival to hospital. Patient confirms he has a driver taking him home post procedure. All questions addressed.

## 2019-11-30 ENCOUNTER — Ambulatory Visit (HOSPITAL_COMMUNITY)
Admission: RE | Admit: 2019-11-30 | Discharge: 2019-11-30 | Disposition: A | Discharge status: Home / Self Care | Payer: Commercial Managed Care - PPO | Attending: Cardiology | Admitting: Cardiology

## 2019-11-30 ENCOUNTER — Other Ambulatory Visit: Payer: Self-pay

## 2019-11-30 ENCOUNTER — Ambulatory Visit (HOSPITAL_COMMUNITY): Payer: Commercial Managed Care - PPO | Admitting: Certified Registered Nurse Anesthetist

## 2019-11-30 ENCOUNTER — Encounter (HOSPITAL_COMMUNITY)
Admission: RE | Disposition: A | Discharge status: Home / Self Care | Payer: Self-pay | Source: Home / Self Care | Attending: Cardiology

## 2019-11-30 ENCOUNTER — Encounter (HOSPITAL_COMMUNITY): Payer: Self-pay | Admitting: Cardiology

## 2019-11-30 DIAGNOSIS — I5021 Acute systolic (congestive) heart failure: Secondary | ICD-10-CM | POA: Insufficient documentation

## 2019-11-30 DIAGNOSIS — D6869 Other thrombophilia: Secondary | ICD-10-CM | POA: Insufficient documentation

## 2019-11-30 DIAGNOSIS — G4733 Obstructive sleep apnea (adult) (pediatric): Secondary | ICD-10-CM | POA: Insufficient documentation

## 2019-11-30 DIAGNOSIS — Z7901 Long term (current) use of anticoagulants: Secondary | ICD-10-CM | POA: Diagnosis not present

## 2019-11-30 DIAGNOSIS — E785 Hyperlipidemia, unspecified: Secondary | ICD-10-CM | POA: Diagnosis not present

## 2019-11-30 DIAGNOSIS — I4819 Other persistent atrial fibrillation: Secondary | ICD-10-CM

## 2019-11-30 DIAGNOSIS — Z79899 Other long term (current) drug therapy: Secondary | ICD-10-CM | POA: Insufficient documentation

## 2019-11-30 HISTORY — PX: CARDIOVERSION: SHX1299

## 2019-11-30 SURGERY — CARDIOVERSION
Anesthesia: General

## 2019-11-30 MED ORDER — LIDOCAINE HCL (CARDIAC) PF 100 MG/5ML IV SOSY
PREFILLED_SYRINGE | INTRAVENOUS | Status: DC | PRN
  Administered 2019-11-30: 60 mg via INTRAVENOUS

## 2019-11-30 MED ORDER — SODIUM CHLORIDE 0.9 % IV SOLN
INTRAVENOUS | Status: AC | PRN
  Administered 2019-11-30: 500 mL via INTRAVENOUS

## 2019-11-30 MED ORDER — PROPOFOL 10 MG/ML IV BOLUS
INTRAVENOUS | Status: DC | PRN
  Administered 2019-11-30: 60 mg via INTRAVENOUS

## 2019-11-30 NOTE — Procedures (Signed)
Electrical Cardioversion Procedure Note Steven Gray [id number] Jul 31, 1950  Procedure: Electrical Cardioversion Indications:  Atrial Fibrillation  Procedure Details Consent: Risks of procedure as well as the alternatives and risks of each were explained to the (patient/caregiver).  Consent for procedure obtained. Time Out: Verified patient identification, verified procedure, site/side was marked, verified correct patient position, special equipment/implants available, medications/allergies/relevent history reviewed, required imaging and test results available.  Performed  Patient placed on cardiac monitor, pulse oximetry, supplemental oxygen as necessary.  Sedation given: Patient sedated by anesthesia with lidocaine 60 milligrams and diprovan 60 milligrams IV. Pacer pads placed anterior and posterior chest.  Cardioverted 1 time(s).  Cardioverted at 200J.  Evaluation Findings: Post procedure EKG shows: NSR Complications: None Patient did tolerate procedure well.   [name] 11/30/2019, 7:39 AM

## 2019-11-30 NOTE — Anesthesia Postprocedure Evaluation (Signed)
Anesthesia Post Note  Patient: Steven Gray  Procedure(s) Performed: CARDIOVERSION (N/A )     Patient location during evaluation: Endoscopy Anesthesia Type: MAC Level of consciousness: awake and alert Pain management: pain level controlled Vital Signs Assessment: post-procedure vital signs reviewed and stable Respiratory status: spontaneous breathing, nonlabored ventilation and respiratory function stable Cardiovascular status: blood pressure returned to baseline and stable Postop Assessment: no apparent nausea or vomiting Anesthetic complications: no   No complications documented.  Last Vitals:  Vitals:   11/30/19 0847 11/30/19 0857  BP: 107/80 121/79  Pulse:  58  Resp: 18 14  Temp: 37.2 C   SpO2: 100% 98%    Last Pain:  Vitals:   11/30/19 0857  TempSrc:   PainSc: 0-No pain                 Lidia Collum

## 2019-11-30 NOTE — Transfer of Care (Signed)
Immediate Anesthesia Transfer of Care Note  Patient: Steven Gray  Procedure(s) Performed: CARDIOVERSION (N/A )  Patient Location: Endoscopy Unit  Anesthesia Type:General  Level of Consciousness: awake, alert , oriented, drowsy and patient cooperative  Airway & Oxygen Therapy: Patient Spontanous Breathing and Patient connected to nasal cannula oxygen  Post-op Assessment: Report given to RN and Post -op Vital signs reviewed and stable  Post vital signs: Reviewed and stable  Last Vitals:  Vitals Value Taken Time  BP 121/96 0844  Temp    Pulse 64   Resp    SpO2 98     Last Pain:  Vitals:   11/30/19 0808  PainSc: 0-No pain         Complications: No complications documented.

## 2019-11-30 NOTE — Discharge Instructions (Signed)
Electrical Cardioversion What happens during the procedure?   An IV will be inserted into one of your veins.  Sticky patches (electrodes) or metal paddles may be placed on your chest.  You will be given a medicine to help you relax (sedative).  An electrical shock will be delivered. The procedure may vary among health care providers and hospitals. What can I expect after the procedure?  Your blood pressure, heart rate, breathing rate, and blood oxygen level will be monitored until you leave the hospital or clinic.  Your heart rhythm will be watched to make sure it does not change.  You may have some redness on the skin where the shocks were given. Follow these instructions at home:  Do not drive for 24 hours if you were given a sedative during your procedure.  Take over-the-counter and prescription medicines only as told by your health care provider.  Ask your health care provider how to check your pulse. Check it often.  Rest for 48 hours after the procedure or as told by your health care provider.  Avoid or limit your caffeine use as told by your health care provider.  Keep all follow-up visits as told by your health care provider. This is important. Contact a health care provider if:  You feel like your heart is beating too quickly or your pulse is not regular.  You have a serious muscle cramp that does not go away. Get help right away if:  You have discomfort in your chest.  You are dizzy or you feel faint.  You have trouble breathing or you are short of breath.  Your speech is slurred.  You have trouble moving an arm or leg on one side of your body.  Your fingers or toes turn cold or blue. Summary  Electrical cardioversion is the delivery of a jolt of electricity to restore a normal rhythm to the heart.  This procedure may be done right away in an emergency or may be a scheduled procedure if the condition is not an emergency.  Generally, this is a safe  procedure.  After the procedure, check your pulse often as told by your health care provider. This information is not intended to replace advice given to you by your health care provider. Make sure you discuss any questions you have with your health care provider. Document Revised: 11/21/2018 Document Reviewed: 11/21/2018 Elsevier Patient Education  2020 Elsevier Inc.  

## 2019-11-30 NOTE — H&P (Signed)
ATRIAL FIB OFFICE VISIT  11/27/2019 [facility]   [place], [place] R, PA Cardiology  Persistent atrial fibrillation (HCC) +1 more Dx  Referred by Geoffry Paradise, MD Reason for Visit  Additional Documentation  Vitals:  BP 120/70  Pulse 72  Ht 6\' 1"  (1.854 m)  Wt 86.5 kg  BMI 25.15 kg/m  BSA 2.11 m    More Vitals  Flowsheets:  Anthropometrics,  NEWS,  MEWS Score,  Method of Visit    Encounter Info:  Billing Info,  History,  Allergies,  Detailed Report    All Notes  Progress Notes by , PA at 11/27/2019 8:30 AM Author: 11/29/2019, PA Author Type: Physician Assistant Filed: 11/27/2019 9:28 AM  Note Status: Signed Cosign: Cosign Not Required Date of Service: 11/27/2019 8:30 AM  Editor: 11/29/2019, PA (Physician Assistant)                    Primary Care Physician: Danice Goltz, MD Primary Electrophysiologist: Dr Geoffry Paradise Referring Physician: Graciela Husbands   Steven Gray is a 69 y.o. male with a history of HLD, OSA, persistent atrial fibrillation, systolic CHF  who presents for follow up in the [facility].  The patient was initially diagnosed with atrial fibrillation 06/03/19 after presenting to the ER with a 60-month onset of worsening dyspnea, orthopnea and PND. Patient was previously seen by Dr. 3-month in 2014 for palpitation. ETT at the time was low risk. Heart monitor showed sinus rhythm with PAC and PVC, otherwise no significant arrhythmia. On arrival to the ED at [facility] on 06/03/2019, he was noted to be in new atrial fibrillation with RVR with heart rate of 110-120s. Echo showed reduced EF 30-35% with a severely dilated LA. Patient was started on Eliquis for a CHADS2VASC score of 2. Plan was for patient to undergo TEE/DCCV but TEE showed dense smoke and LA thrombus and so DCCV was not attempted. He was discharged on Toprol and digoxin for rate control.   On follow up  today, patient underwent TEE guided DCCV on 07/04/19 which was successful. Repeat echo and cardiac MRI showed normalized EF with severe biatrial enlargement. Patient had done well until last week when his smart watch again showed afib. He reports good rate control and has been asymptomatic.   Today, he denies symptoms of palpitations, chest pain, orthopnea, PND, lower extremity edema, dizziness, presyncope, syncope, bleeding, or neurologic sequela. The patient is tolerating medications without difficulties and is otherwise without complaint today.    Atrial Fibrillation Risk Factors:  he does have symptoms or diagnosis of sleep apnea. he is compliant with CPAP therapy. he does not have a history of rheumatic fever. he does have a history of alcohol use. The patient does not have a history of early familial atrial fibrillation or other arrhythmias.  he has a BMI of Body mass index is 25.15 kg/m.09/03/19    Filed Weights   11/27/19 0846  Weight: 86.5 kg         Family History  Problem Relation Age of Onset  . Polycystic kidney disease Mother   . Vascular Disease Father        vascular dementia  . CAD Maternal Grandfather   . Atrial fibrillation Neg Hx      Atrial Fibrillation Management history:  Previous antiarrhythmic drugs: none Previous cardioversions: 07/04/19 Previous ablations: none CHADS2VASC score: 2 Anticoagulation history: Eliquis       Past  Medical History:  Diagnosis Date  . Acute systolic CHF (congestive heart failure) (HCC) 06/03/2019  . Alcohol use 06/03/2019  . Atrial fibrillation (HCC) 06/03/2019  . 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 Heart palpitations 11/30/2012  . Hyperlipidemia    does not take medication for this, denies  . Increased prostate specific antigen (PSA) velocity   . Right knee pain 12/07/2016  . Sleep apnea    wears CPAP        Past Surgical History:  Procedure Laterality Date  . CARDIOVERSION N/A 07/04/2019    Procedure: CARDIOVERSION;  Surgeon: Quintella Reichert, MD;  Location: [facility] ENDOSCOPY;  Service: Cardiovascular;  Laterality: N/A;  . MENISCUS REPAIR    . TEE WITHOUT CARDIOVERSION N/A 06/05/2019   Procedure: TRANSESOPHAGEAL ECHOCARDIOGRAM (TEE);  Surgeon: Wendall Stade, MD;  Location: [facility] ENDOSCOPY;  Service: Cardiovascular;  Laterality: N/A;  . TEE WITHOUT CARDIOVERSION N/A 07/04/2019   Procedure: TRANSESOPHAGEAL ECHOCARDIOGRAM (TEE);  Surgeon: Quintella Reichert, MD;  Location: [facility] ENDOSCOPY;  Service: Cardiovascular;  Laterality: N/A;          Current Outpatient Medications  Medication Sig Dispense Refill  . apixaban (ELIQUIS) 5 MG TABS tablet Take 1 tablet (5 mg total) by mouth 2 (two) times daily. 60 tablet 5  . atorvastatin (LIPITOR) 40 MG tablet TAKE 1 TABLET(40 MG) BY MOUTH DAILY AT 6 PM 30 tablet 11  . losartan (COZAAR) 25 MG tablet TAKE 1 TABLET(25 MG) BY MOUTH AT BEDTIME 30 tablet 11  . metoprolol succinate (TOPROL-XL) 50 MG 24 hr tablet Take 0.5 tablets (25 mg total) by mouth daily. Take with or immediately following a meal. 45 tablet 3  . zolpidem (AMBIEN) 10 MG tablet Take 3.3333 mg by mouth at bedtime as needed for sleep. 1/3 tablet     No current facility-administered medications for this encounter.         Allergies  Allergen Reactions  . Other     Cats    Social History        Socioeconomic History  . Marital status: Married    Spouse name: Not on file  . Number of children: Not on file  . Years of education: Not on file  . Highest education level: Not on file  Occupational History  . Occupation: warehousing  Tobacco Use  . Smoking status: Never Smoker  . Smokeless tobacco: Never Used  Substance and Sexual Activity  . Alcohol use: Not Currently    Comment: 15+ drinks/week wine  . Drug use: No  . Sexual activity: Not on file  Other Topics Concern  . Not on file  Social History Narrative   Married with 2 sons, nonsmoker, social ETOH,  works as Psychologist, educational in Geologist, engineering. Frequent exercise.   Social Determinants of Health   Financial Resource Strain:   . Difficulty of Paying Living Expenses:   Food Insecurity:   . Worried About Programme researcher, broadcasting/film/video in the Last Year:   . Barista in the Last Year:   Transportation Needs:   . Freight forwarder (Medical):   Marland Kitchen Lack of Transportation (Non-Medical):   Physical Activity:   . Days of Exercise per Week:   . Minutes of Exercise per Session:   Stress:   . Feeling of Stress :   Social Connections:   . Frequency of Communication with Friends and Family:   . Frequency of Social Gatherings with Friends and Family:   . Attends Religious Services:   .  Active Member of Clubs or Organizations:   . Attends Banker Meetings:   Marland Kitchen Marital Status:   Intimate Partner Violence:   . Fear of Current or Ex-Partner:   . Emotionally Abused:   Marland Kitchen Physically Abused:   . Sexually Abused:      ROS- All systems are reviewed and negative except as per the HPI above.  Physical Exam:    Vitals:   11/27/19 0846  BP: 120/70  Pulse: 72  Weight: 86.5 kg  Height: 6\' 1"  (1.854 m)    GEN- The patient is well appearing, alert and oriented x 3 today.   HEENT-head normocephalic, atraumatic, sclera clear, conjunctiva pink, hearing intact, trachea midline. Lungs- Clear to ausculation bilaterally, normal work of breathing Heart- irregular rate and rhythm, no murmurs, rubs or gallops  GI- soft, NT, ND, + BS Extremities- no clubbing, cyanosis, or edema MS- no significant deformity or atrophy Skin- no rash or lesion Psych- euthymic mood, full affect Neuro- strength and sensation are intact      Wt Readings from Last 3 Encounters:  11/27/19 86.5 kg  09/07/19 84.8 kg  07/17/19 86.6 kg    EKG today demonstrates afib HR 72, QRS 90, QTc 429  Echo 06/03/19 demonstrated  1. Left ventricular ejection fraction, by visual estimation, is 30 to  35%. The  left ventricle has moderate to severely decreased function. There  is no left ventricular hypertrophy.  2. Left ventricular diastolic parameters are indeterminate.  3. The left ventricle demonstrates global hypokinesis.  4. Atrial fibrillation makes accurate assessment of LVEF diffciult.  5. Global right ventricle has moderately reduced systolic function.The  right ventricular size is normal. No increase in right ventricular wall  thickness.  6. Left atrial size was severely dilated.  7. Right atrial size was normal.  8. The mitral valve is abnormal. Mild mitral valve regurgitation.  9. The tricuspid valve is normal in structure.  10. The tricuspid valve is normal in structure. Tricuspid valve  regurgitation is moderate.  11. The aortic valve is grossly normal. Aortic valve regurgitation is not  visualized.  12. Pulmonic regurgitation is mild.  13. The pulmonic valve was normal in structure. Pulmonic valve  regurgitation is mild.  14. Aortic dilatation noted.  15. There is mild dilatation of the ascending aorta.  16. Moderately elevated pulmonary artery systolic pressure.  17. The interatrial septum was not well visualized.   Epic records are reviewed at length today  CHA2DS2-VASc Score = 2  The patient's score is based upon: CHF History: 1 HTN History: 0 Age : 1 Diabetes History: 0 Stroke History: 0 Vascular Disease History: 0 Gender: 0      ASSESSMENT AND PLAN: 1. Persistent Atrial Fibrillation (ICD10:  I48.19) The patient's CHA2DS2-VASc score is 2, indicating a 2.2% annual risk of stroke.   S/p TEE/DCCV on 07/04/19. He is in rate controlled afib today. We discussed therapeutic options today including AAD and DCCV. We specifically discussed dofetilide. At this time, he would like to try DCCV alone. He is agreeable to dofetilide admission if he should have early return of his afib. Given his severe biatrial enlargement, would try AAD before considering ablation.   Continue Eliquis 5 mg BID. Patient denies any missed doses in the last 3 weeks. Continue Toprol 12.5 mg daily Check bmet/CBC  2. Secondary Hypercoagulable State (ICD10:  D68.69) The patient is at significant risk for stroke/thromboembolism based upon his CHA2DS2-VASc Score of 2.  Continue Apixaban (Eliquis).  3. Obstructive sleep apnea Patient reports compliance with CPAP therapy.  4. Systolic dysfunction Suspect tachycardia mediated.  Resolved on cardiac MRI and echo after DCCV.   Follow up in the AF clinic one week post DCCV.   Jorja Loa PA-C Afib Clinic [facility] [address] [place], [place] [address] [phone] 11/27/2019 9:23 AM     For DCCV; compliant with apixaban; no changes. [name]

## 2019-11-30 NOTE — Anesthesia Preprocedure Evaluation (Addendum)
Anesthesia Evaluation  Patient identified by MRN, date of birth, ID band Patient awake    Reviewed: Allergy & Precautions, NPO status , Patient's Chart, lab work & pertinent test results  History of Anesthesia Complications Negative for: history of anesthetic complications  Airway Mallampati: I  TM Distance: >3 FB Neck ROM: Full    Dental   Pulmonary sleep apnea and Continuous Positive Airway Pressure Ventilation ,    Pulmonary exam normal        Cardiovascular +CHF  Normal cardiovascular exam+ dysrhythmias Atrial Fibrillation      Neuro/Psych negative neurological ROS  negative psych ROS   GI/Hepatic negative GI ROS, Neg liver ROS,   Endo/Other  negative endocrine ROS  Renal/GU negative Renal ROS  negative genitourinary   Musculoskeletal negative musculoskeletal ROS (+)   Abdominal   Peds  Hematology negative hematology ROS (+)   Anesthesia Other Findings   Reproductive/Obstetrics                            Anesthesia Physical Anesthesia Plan  ASA: III  Anesthesia Plan: General   Post-op Pain Management:    Induction: Intravenous  PONV Risk Score and Plan: 2 and TIVA and Treatment may vary due to age or medical condition  Airway Management Planned: Mask  Additional Equipment: None  Intra-op Plan:   Post-operative Plan:   Informed Consent: I have reviewed the patients History and Physical, chart, labs and discussed the procedure including the risks, benefits and alternatives for the proposed anesthesia with the patient or authorized representative who has indicated his/her understanding and acceptance.     Dental advisory given  Plan Discussed with:   Anesthesia Plan Comments:         Anesthesia Quick Evaluation

## 2019-12-01 ENCOUNTER — Encounter (HOSPITAL_COMMUNITY): Payer: Self-pay | Admitting: Cardiology

## 2019-12-06 NOTE — Progress Notes (Signed)
Primary Care Physician: Geoffry Paradise, MD Primary Electrophysiologist: Dr Graciela Husbands Referring Physician: Francis Dowse   Steven Gray is a 69 y.o. male with a history of HLD, OSA, persistent atrial fibrillation, systolic CHF  who presents for follow up in the [facility].  The patient was initially diagnosed with atrial fibrillation 06/03/19 after presenting to the ER with a 80-month onset of worsening dyspnea, orthopnea and PND.  Patient was previously seen by Dr. Excell Seltzer in 2014 for palpitation.  ETT at the time was low risk.  Heart monitor showed sinus rhythm with PAC and PVC, otherwise no significant arrhythmia. On arrival to the ED at [facility] on 06/03/2019, he was noted to be in new atrial fibrillation with RVR with heart rate of 110-120s. Echo showed reduced EF 30-35% with a severely dilated LA. Patient was started on Eliquis for a CHADS2VASC score of 2. Plan was for patient to undergo TEE/DCCV but TEE showed dense smoke and LA thrombus and so DCCV was not attempted. He was discharged on Toprol and digoxin for rate control. Patient underwent TEE guided DCCV on 07/04/19 which was successful. Repeat echo and cardiac MRI showed normalized EF with severe biatrial enlargement. Patient had done well until the week of 11/20/19 when his smart watch again showed afib. He reports good rate control and has been asymptomatic.   On follow up today, patient is s/p DCCV on 11/30/19. Patient reports he has done well since the procedure. He has not seen any afib on his smart watch. He denies bleeding issues on anticoagulation.   Today, he denies symptoms of palpitations, chest pain, orthopnea, PND, lower extremity edema, dizziness, presyncope, syncope, bleeding, or neurologic sequela. The patient is tolerating medications without difficulties and is otherwise without complaint today.    Atrial Fibrillation Risk Factors:  he does have symptoms or diagnosis of sleep apnea. he  is compliant with CPAP therapy. he does not have a history of rheumatic fever. he does have a history of alcohol use. The patient does not have a history of early familial atrial fibrillation or other arrhythmias.  he has a BMI of Body mass index is 24.91 kg/m.Marland Kitchen Filed Weights   12/07/19 0854  Weight: 85.6 kg    Family History  Problem Relation Age of Onset   Polycystic kidney disease Mother    Vascular Disease Father        vascular dementia   CAD Maternal Grandfather    Atrial fibrillation Neg Hx      Atrial Fibrillation Management history:  Previous antiarrhythmic drugs: none Previous cardioversions: 07/04/19, 11/30/19 Previous ablations: none CHADS2VASC score: 2 Anticoagulation history: Eliquis   Past Medical History:  Diagnosis Date   Acute systolic CHF (congestive heart failure) (HCC) 06/03/2019   Alcohol use 06/03/2019   Atrial fibrillation (HCC) 06/03/2019   Haglund's deformity of left heel 09/26/2015   Heart palpitations 11/30/2012   Hyperlipidemia    does not take medication for this, denies   Increased prostate specific antigen (PSA) velocity    Right knee pain 12/07/2016   Sleep apnea    wears CPAP   Past Surgical History:  Procedure Laterality Date   CARDIOVERSION N/A 07/04/2019   Procedure: CARDIOVERSION;  Surgeon: Quintella Reichert, MD;  Location: [facility] ENDOSCOPY;  Service: Cardiovascular;  Laterality: N/A;   CARDIOVERSION N/A 11/30/2019   Procedure: CARDIOVERSION;  Surgeon: Lewayne Bunting, MD;  Location: [facility] ENDOSCOPY;  Service: Cardiovascular;  Laterality: N/A;   MENISCUS REPAIR  TEE WITHOUT CARDIOVERSION N/A 06/05/2019   Procedure: TRANSESOPHAGEAL ECHOCARDIOGRAM (TEE);  Surgeon: Wendall Stade, MD;  Location: [facility] ENDOSCOPY;  Service: Cardiovascular;  Laterality: N/A;   TEE WITHOUT CARDIOVERSION N/A 07/04/2019   Procedure: TRANSESOPHAGEAL ECHOCARDIOGRAM (TEE);  Surgeon: Quintella Reichert, MD;  Location: [facility] ENDOSCOPY;  Service: Cardiovascular;   Laterality: N/A;    Current Outpatient Medications  Medication Sig Dispense Refill   apixaban (ELIQUIS) 5 MG TABS tablet Take 1 tablet (5 mg total) by mouth 2 (two) times daily. 60 tablet 5   atorvastatin (LIPITOR) 40 MG tablet TAKE 1 TABLET(40 MG) BY MOUTH DAILY AT 6 PM (Patient taking differently: Take 40 mg by mouth daily. ) 30 tablet 11   losartan (COZAAR) 25 MG tablet TAKE 1 TABLET(25 MG) BY MOUTH AT BEDTIME (Patient taking differently: Take 25 mg by mouth at bedtime. ) 30 tablet 11   metoprolol succinate (TOPROL-XL) 50 MG 24 hr tablet Take 0.5 tablets (25 mg total) by mouth daily. Take with or immediately following a meal. 45 tablet 3   triamcinolone cream (KENALOG) 0.1 % Apply 1 application topically 3 (three) times daily as needed for rash.     zolpidem (AMBIEN) 10 MG tablet Take 3.33 mg by mouth at bedtime as needed for sleep. 1/3 tablet     No current facility-administered medications for this encounter.    No Known Allergies  Social History   Socioeconomic History   Marital status: Married    Spouse name: Not on file   Number of children: Not on file   Years of education: Not on file   Highest education level: Not on file  Occupational History   Occupation: warehousing  Tobacco Use   Smoking status: Never Smoker   Smokeless tobacco: Never Used  Substance and Sexual Activity   Alcohol use: Not Currently    Comment: 15+ drinks/week wine   Drug use: No   Sexual activity: Not on file  Other Topics Concern   Not on file  Social History Narrative   Married with 2 sons, nonsmoker, social ETOH, works as Psychologist, educational in Geologist, engineering. Frequent exercise.   Social Determinants of Health   Financial Resource Strain:    Difficulty of Paying Living Expenses:   Food Insecurity:    Worried About Programme researcher, broadcasting/film/video in the Last Year:    Barista in the Last Year:   Transportation Needs:    Freight forwarder (Medical):    Lack of  Transportation (Non-Medical):   Physical Activity:    Days of Exercise per Week:    Minutes of Exercise per Session:   Stress:    Feeling of Stress :   Social Connections:    Frequency of Communication with Friends and Family:    Frequency of Social Gatherings with Friends and Family:    Attends Religious Services:    Active Member of Clubs or Organizations:    Attends Engineer, structural:    Marital Status:   Intimate Partner Violence:    Fear of Current or Ex-Partner:    Emotionally Abused:    Physically Abused:    Sexually Abused:      ROS- All systems are reviewed and negative except as per the HPI above.  Physical Exam: Vitals:   12/07/19 0854  BP: 116/66  Pulse: (!) 55  Weight: 85.6 kg  Height: 6\' 1"  (1.854 m)    GEN- The patient is well appearing, alert and oriented x 3 today.  HEENT-head normocephalic, atraumatic, sclera clear, conjunctiva pink, hearing intact, trachea midline. Lungs- Clear to ausculation bilaterally, normal work of breathing Heart- Regular rate and rhythm, bradycardia, no murmurs, rubs or gallops  GI- soft, NT, ND, + BS Extremities- no clubbing, cyanosis, or edema MS- no significant deformity or atrophy Skin- no rash or lesion Psych- euthymic mood, full affect Neuro- strength and sensation are intact   Wt Readings from Last 3 Encounters:  12/07/19 85.6 kg  11/30/19 86.5 kg  11/27/19 86.5 kg    EKG today demonstrates SB HR 55, PR 160, QRS 94, QTc 449  Echo 06/03/19 demonstrated  1. Left ventricular ejection fraction, by visual estimation, is 30 to  35%. The left ventricle has moderate to severely decreased function. There  is no left ventricular hypertrophy.  2. Left ventricular diastolic parameters are indeterminate.  3. The left ventricle demonstrates global hypokinesis.  4. Atrial fibrillation makes accurate assessment of LVEF diffciult.  5. Global right ventricle has moderately reduced systolic  function.The  right ventricular size is normal. No increase in right ventricular wall  thickness.  6. Left atrial size was severely dilated.  7. Right atrial size was normal.  8. The mitral valve is abnormal. Mild mitral valve regurgitation.  9. The tricuspid valve is normal in structure.  10. The tricuspid valve is normal in structure. Tricuspid valve  regurgitation is moderate.  11. The aortic valve is grossly normal. Aortic valve regurgitation is not  visualized.  12. Pulmonic regurgitation is mild.  13. The pulmonic valve was normal in structure. Pulmonic valve  regurgitation is mild.  14. Aortic dilatation noted.  15. There is mild dilatation of the ascending aorta.  16. Moderately elevated pulmonary artery systolic pressure.  17. The interatrial septum was not well visualized.   Epic records are reviewed at length today  CHA2DS2-VASc Score = 2  The patient's score is based upon: CHF History: 1 HTN History: 0 Age : 1 Diabetes History: 0 Stroke History: 0 Vascular Disease History: 0 Gender: 0      ASSESSMENT AND PLAN: 1. Persistent Atrial Fibrillation (ICD10:  I48.19) The patient's CHA2DS2-VASc score is 2, indicating a 2.2% annual risk of stroke.   S/p DCCV on 11/30/19. We discussed dofetilide again today. Patient has called his insurance and it will be affordable for him. He would like to discuss with EP before making a decision. Given his severe biatrial enlargement, would try AAD before considering ablation.  Continue Eliquis 5 mg BID Continue Toprol 25 mg daily  2. Secondary Hypercoagulable State (ICD10:  D68.69) The patient is at significant risk for stroke/thromboembolism based upon his CHA2DS2-VASc Score of 2.  Continue Apixaban (Eliquis).     3. Obstructive sleep apnea Patient reports compliance with CPAP therapy.  4. Systolic dysfunction Suspect tachycardia mediated.  Resolved on cardiac MRI and echo after DCCV.   Follow up with Dr Graciela Husbands to discuss  dofetilide.    Jorja Loa PA-C [facility] [address] [place], [place] [address] [phone] 12/07/2019 9:34 AM

## 2019-12-07 ENCOUNTER — Other Ambulatory Visit: Payer: Self-pay

## 2019-12-07 ENCOUNTER — Ambulatory Visit (HOSPITAL_COMMUNITY)
Admission: RE | Admit: 2019-12-07 | Discharge: 2019-12-07 | Disposition: A | Discharge status: Home / Self Care | Payer: Commercial Managed Care - PPO | Source: Ambulatory Visit | Attending: Physician Assistant | Admitting: Physician Assistant

## 2019-12-07 VITALS — BP 116/66 | HR 55 | Ht 73.0 in | Wt 188.8 lb

## 2019-12-07 DIAGNOSIS — D6869 Other thrombophilia: Secondary | ICD-10-CM | POA: Diagnosis not present

## 2019-12-07 DIAGNOSIS — Z7901 Long term (current) use of anticoagulants: Secondary | ICD-10-CM | POA: Diagnosis not present

## 2019-12-07 DIAGNOSIS — G4733 Obstructive sleep apnea (adult) (pediatric): Secondary | ICD-10-CM | POA: Diagnosis not present

## 2019-12-07 DIAGNOSIS — I5022 Chronic systolic (congestive) heart failure: Secondary | ICD-10-CM | POA: Insufficient documentation

## 2019-12-07 DIAGNOSIS — I4891 Unspecified atrial fibrillation: Secondary | ICD-10-CM | POA: Diagnosis present

## 2019-12-07 DIAGNOSIS — I4819 Other persistent atrial fibrillation: Secondary | ICD-10-CM

## 2019-12-07 DIAGNOSIS — Z8249 Family history of ischemic heart disease and other diseases of the circulatory system: Secondary | ICD-10-CM | POA: Diagnosis not present

## 2019-12-07 DIAGNOSIS — Z79899 Other long term (current) drug therapy: Secondary | ICD-10-CM | POA: Diagnosis not present

## 2019-12-07 DIAGNOSIS — E785 Hyperlipidemia, unspecified: Secondary | ICD-10-CM | POA: Insufficient documentation

## 2019-12-07 DIAGNOSIS — I088 Other rheumatic multiple valve diseases: Secondary | ICD-10-CM | POA: Insufficient documentation

## 2019-12-18 ENCOUNTER — Encounter: Payer: Self-pay | Admitting: Internal Medicine

## 2019-12-18 ENCOUNTER — Other Ambulatory Visit: Payer: Self-pay

## 2019-12-18 ENCOUNTER — Ambulatory Visit: Payer: Commercial Managed Care - PPO | Admitting: Internal Medicine

## 2019-12-18 VITALS — BP 112/64 | HR 51 | Ht 73.0 in | Wt 189.0 lb

## 2019-12-18 DIAGNOSIS — I4819 Other persistent atrial fibrillation: Secondary | ICD-10-CM | POA: Diagnosis not present

## 2019-12-18 NOTE — Progress Notes (Signed)
Patient Care Team: Geoffry Paradise, MD as PCP - General (Internal Medicine)   HPI  Steven Gray is a 69 y.o. male seen following presentation with congestive heart failure found to be in atrial fibrillation with a rapid rate and with a cardiomyopathy with depressed LV function.  Initial efforts at TEE cardioversion were complicated by the presence of left atrial clot.  He was also found to have severe left atrial enlargement.  4 weeks of anticoagulation resulted in resolution of the clot and he underwent cardioversion and feels much much better.    Taking beta-blockers and losartan.  Interval atrial fibrillation 7/21.  Underwent cardioversion following week.  The patient denies chest pain, shortness of breath, nocturnal dyspnea, orthopnea or peripheral edema.  There have been no palpitations, lightheadedness or syncope.        DATE TEST EF   1/21 Echo  30-35 % LAE-severe (40ml/m2)  *3/21 TEE 30-35 %   4/21 Echo  60-65%   6/21 cMRI 62% LAE mod-RAE mild LGE neg   Date Cr K Hgb  2/21 1.36 4.4 15.8  7/21  1.19 4.0 14.9     Records and Results Reviewed   Past Medical History:  Diagnosis Date  . Acute systolic CHF (congestive heart failure) (HCC) 06/03/2019  . Alcohol use 06/03/2019  . Atrial fibrillation (HCC) 06/03/2019  . 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 Heart palpitations 11/30/2012  . Hyperlipidemia    does not take medication for this, denies  . Increased prostate specific antigen (PSA) velocity   . Right knee pain 12/07/2016  . Sleep apnea    wears CPAP    Past Surgical History:  Procedure Laterality Date  . CARDIOVERSION N/A 07/04/2019   Procedure: CARDIOVERSION;  Surgeon: Quintella Reichert, MD;  Location: [facility] ENDOSCOPY;  Service: Cardiovascular;  Laterality: N/A;  . CARDIOVERSION N/A 11/30/2019   Procedure: CARDIOVERSION;  Surgeon: Lewayne Bunting, MD;  Location: [facility] ENDOSCOPY;  Service: Cardiovascular;  Laterality: N/A;  . MENISCUS REPAIR     . TEE WITHOUT CARDIOVERSION N/A 06/05/2019   Procedure: TRANSESOPHAGEAL ECHOCARDIOGRAM (TEE);  Surgeon: Wendall Stade, MD;  Location: [facility] ENDOSCOPY;  Service: Cardiovascular;  Laterality: N/A;  . TEE WITHOUT CARDIOVERSION N/A 07/04/2019   Procedure: TRANSESOPHAGEAL ECHOCARDIOGRAM (TEE);  Surgeon: Quintella Reichert, MD;  Location: [facility] ENDOSCOPY;  Service: Cardiovascular;  Laterality: N/A;    Current Meds  Medication Sig  . atorvastatin (LIPITOR) 40 MG tablet TAKE 1 TABLET(40 MG) BY MOUTH DAILY AT 6 PM (Patient taking differently: Take 40 mg by mouth daily. )  . losartan (COZAAR) 25 MG tablet TAKE 1 TABLET(25 MG) BY MOUTH AT BEDTIME (Patient taking differently: Take 25 mg by mouth at bedtime. )  . metoprolol succinate (TOPROL-XL) 50 MG 24 hr tablet Take 0.5 tablets (25 mg total) by mouth daily. Take with or immediately following a meal.  . triamcinolone cream (KENALOG) 0.1 % Apply 1 application topically 3 (three) times daily as needed for rash.  . zolpidem (AMBIEN) 10 MG tablet Take 3.33 mg by mouth at bedtime as needed for sleep. 1/3 tablet    No Known Allergies    Review of Systems negative except from HPI and PMH  Physical Exam BP 112/64   Pulse (!) 51   Ht 6\' 1"  (1.854 m)   Wt 189 lb (85.7 kg)   SpO2 97%   BMI 24.94 kg/m  Well developed and well nourished in no acute distress HENT normal E scleral  and icterus clear Neck Supple JVP flat; carotids brisk and full Clear to ausculation  Regular rate and rhythm, no murmurs gallops or rub Soft with active bowel sounds No clubbing cyanosis  Edema Alert and oriented, grossly normal motor and sensory function Skin Warm and Dry  ECG sinus at 53 Interval 17/09/47  CrCl cannot be calculated (Patient's most recent lab result is older than the maximum 21 days allowed.).   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     Patient has had recurrent atrial fibrillation.  Discussions at the A. fib clinic focused on severe left atrial enlargement precluding catheter ablation and recommendations of antiarrhythmic therapy.  Taking somewhat differently about that, would anticipate repeat cardioversion serially until the frequency of the events dictates the need for doing something different, probably more frequently than every 3-4 months.  At that juncture, would favor ablation as opposed to drug therapy.  MRI described LAE as moderate.  Unfortunately there are no volumes.  Will review with Dr. Fawn Kirk this issue-I would anticipate that there would have been recovery of LA size at the same time that there was recovery of LV function.  Certainly the MRI suggest that that is in part the case.  Discussed ablation extensively, specifically cabana cabana like patients for meta-analysis and the benefits as well as the lack of clear guidance as to the role of anticoagulation thereafter.  Also discussed the lack of clear data on the role of maintaining beta-blockers and ARB's in the context of people who have reversible cardiomyopathy secondary to tachycardia.  The studies that I have read I will have to review as I do not recall specifically as to whether they excluded or included patients with tachycardia induced cardiomyopathy; but otherwise maintain of beta-blockers and ACE inhibitor/ARB is has been demonstrated to be important

## 2019-12-18 NOTE — Patient Instructions (Signed)

## 2020-01-06 ENCOUNTER — Other Ambulatory Visit: Payer: Self-pay | Admitting: Internal Medicine

## 2020-01-09 NOTE — Telephone Encounter (Signed)
Prescription refill request for Eliquis received. Indication:afib Last office visit:12/18/19 Scr: 1.19 on 11/27/19 Age: 70 Weight: 85.7kg

## 2020-01-10 ENCOUNTER — Telehealth: Payer: Self-pay

## 2020-01-10 NOTE — Telephone Encounter (Signed)
Per Dr Graciela Husbands pt needs refill of Eliquis.  Pt's chart states Eliquis was refilled on 01/09/2020.  Phone call to [facility] who verifies Eliquis refill was received and is ready for pt to pick up.  Phone call to pt and advised medication is ready at pharmacy.  Pt thanked Charity fundraiser for phone call.

## 2020-01-22 ENCOUNTER — Emergency Department (HOSPITAL_COMMUNITY)
Admission: EM | Admit: 2020-01-22 | Discharge: 2020-01-22 | Disposition: A | Discharge status: Home / Self Care | Payer: Commercial Managed Care - PPO | Attending: Emergency Medicine | Admitting: Emergency Medicine

## 2020-01-22 ENCOUNTER — Other Ambulatory Visit: Payer: Self-pay

## 2020-01-22 ENCOUNTER — Encounter (HOSPITAL_COMMUNITY): Payer: Self-pay | Admitting: Emergency Medicine

## 2020-01-22 DIAGNOSIS — R109 Unspecified abdominal pain: Secondary | ICD-10-CM | POA: Diagnosis not present

## 2020-01-22 DIAGNOSIS — Z5321 Procedure and treatment not carried out due to patient leaving prior to being seen by health care provider: Secondary | ICD-10-CM | POA: Insufficient documentation

## 2020-01-22 LAB — CBC
HCT: 43.5 % (ref 39.0–52.0)
Hemoglobin: 14.3 g/dL (ref 13.0–17.0)
MCH: 33.3 pg (ref 26.0–34.0)
MCHC: 32.9 g/dL (ref 30.0–36.0)
MCV: 101.4 fL — ABNORMAL HIGH (ref 80.0–100.0)
Platelets: 202 10*3/uL (ref 150–400)
RBC: 4.29 MIL/uL (ref 4.22–5.81)
RDW: 11.9 % (ref 11.5–15.5)
WBC: 13.8 10*3/uL — ABNORMAL HIGH (ref 4.0–10.5)
nRBC: 0 % (ref 0.0–0.2)

## 2020-01-22 LAB — URINALYSIS, ROUTINE W REFLEX MICROSCOPIC
Bilirubin Urine: NEGATIVE
Glucose, UA: NEGATIVE mg/dL
Hgb urine dipstick: NEGATIVE
Ketones, ur: NEGATIVE mg/dL
Leukocytes,Ua: NEGATIVE
Nitrite: NEGATIVE
Protein, ur: NEGATIVE mg/dL
Specific Gravity, Urine: 1.016 (ref 1.005–1.030)
pH: 5 (ref 5.0–8.0)

## 2020-01-22 LAB — BASIC METABOLIC PANEL
Anion gap: 11 (ref 5–15)
BUN: 22 mg/dL (ref 8–23)
CO2: 24 mmol/L (ref 22–32)
Calcium: 9.8 mg/dL (ref 8.9–10.3)
Chloride: 104 mmol/L (ref 98–111)
Creatinine, Ser: 2.13 mg/dL — ABNORMAL HIGH (ref 0.61–1.24)
GFR calc Af Amer: 36 mL/min — ABNORMAL LOW (ref >60)
GFR calc non Af Amer: 31 mL/min — ABNORMAL LOW (ref >60)
Glucose, Bld: 144 mg/dL — ABNORMAL HIGH (ref 70–99)
Potassium: 4.8 mmol/L (ref 3.5–5.1)
Sodium: 139 mmol/L (ref 135–145)

## 2020-01-22 MED ORDER — OXYCODONE-ACETAMINOPHEN 5-325 MG PO TABS
1.0000 | ORAL_TABLET | Freq: Once | ORAL | Status: AC
  Administered 2020-01-22: 1 via ORAL
  Filled 2020-01-22: qty 1

## 2020-01-22 NOTE — ED Triage Notes (Signed)
Left flank pain that started today at noon and some problems intimally to get urine out, pt denies any blood on the urine.

## 2020-01-22 NOTE — ED Notes (Signed)
Patient has left, states he no longer wants to wait.

## 2020-02-12 ENCOUNTER — Other Ambulatory Visit: Payer: Self-pay | Admitting: Urology

## 2020-02-12 DIAGNOSIS — R972 Elevated prostate specific antigen [PSA]: Secondary | ICD-10-CM

## 2020-02-12 DIAGNOSIS — R1084 Generalized abdominal pain: Secondary | ICD-10-CM

## 2020-02-14 ENCOUNTER — Other Ambulatory Visit: Payer: Commercial Managed Care - PPO

## 2020-03-05 ENCOUNTER — Ambulatory Visit
Admission: RE | Admit: 2020-03-05 | Discharge: 2020-03-05 | Disposition: A | Discharge status: Home / Self Care | Payer: Commercial Managed Care - PPO | Source: Ambulatory Visit | Attending: Urology | Admitting: Urology

## 2020-03-05 DIAGNOSIS — R972 Elevated prostate specific antigen [PSA]: Secondary | ICD-10-CM

## 2020-03-05 IMAGING — MR MR PROSTATE WO/W CM
9 series · 48 of 48 positions shown · IV contrast (Multihance 17 ml)
Comparison: None.

CLINICAL DATA: Elevated PSA (5.9), negative biopsy (by report) in
[X7]

EXAM:
MR PROSTATE WITHOUT AND WITH CONTRAST
TECHNIQUE: Multiplanar multisequence MRI images were obtained of the pelvis
centered about the prostate. Pre and post contrast images were
obtained.
CONTRAST:  17mL MULTIHANCE GADOBENATE DIMEGLUMINE 529 MG/ML IV SOLN

[Series 4: T2 · coronal · 3.0mm · 0.56mm/px · 2 of 23 slices shown (1 of 2)]
[im 1/23]
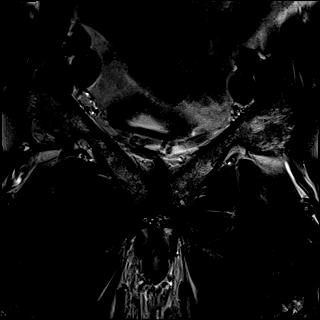
[im 23/23]
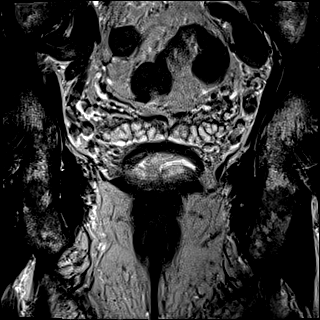

[Series 5: T1 · axial · 5.0mm · 1.25mm/px · z∈[-188,+7]mm · 4 of 80 slices shown]
[im 1/80]
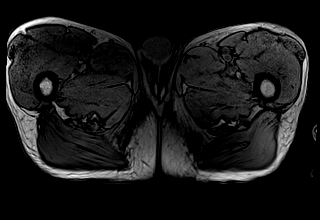
[im 27/80]
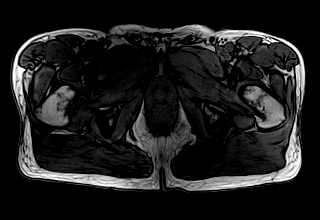
[im 53/80]
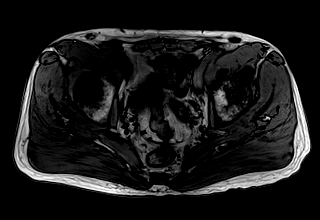
[im 80/80]
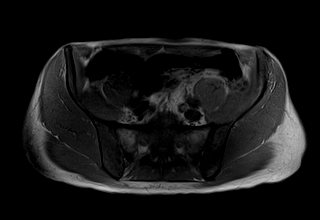

[Series 6: DWI · axial · 3.0mm · 1.75mm/px · z∈[-131,-62]mm · 3 of 71 slices shown (1 of 2)]
[im 1/71]
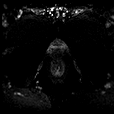
[im 36/71]
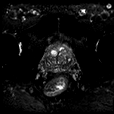
[im 71/71]
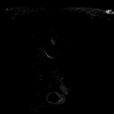

[Series 8: DWI · axial · 3.0mm · 1.75mm/px · 1 of 24 slices shown (2 of 2)]
[im 1/24]
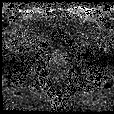

[Series 9: T2 · axial · 3.0mm · 0.56mm/px · 1 of 23 slices shown (2 of 2)]
[im 1/23]
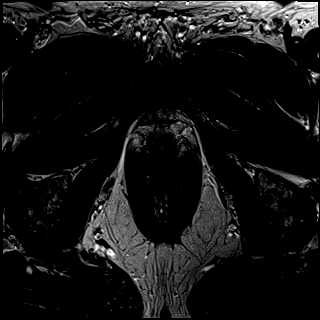

[Series 11: pre t1_twist_tra_dyn · axial · non-contrast · 3.5mm · 0.83mm/px · 1 of 20 slices shown]
[im 1/20]
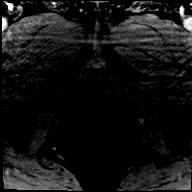

[Series 13: post t1_twist_tra_dyn-copy cent_sub · axial · 3.5mm · 0.83mm/px · z∈[-130,-63]mm · 28 of 580 slices shown]
[im 1/580]
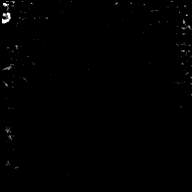
[im 22/580]
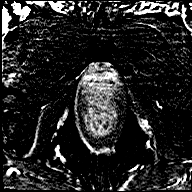
[im 43/580]
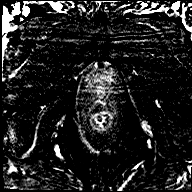
[im 65/580]
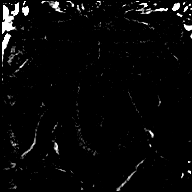
[im 86/580]
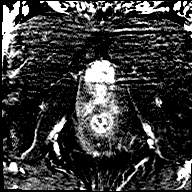
[im 108/580]
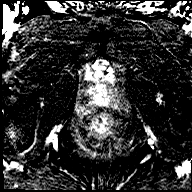
[im 129/580]
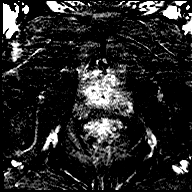
[im 151/580]
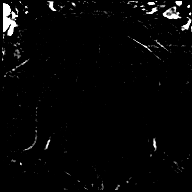
[im 172/580]
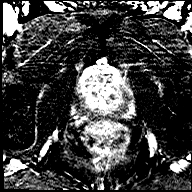
[im 194/580]
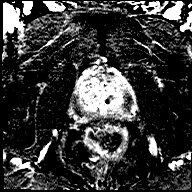
[im 215/580]
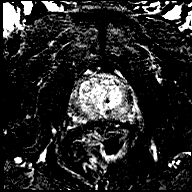
[im 236/580]
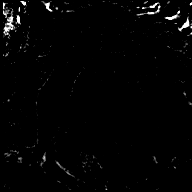
[im 258/580]
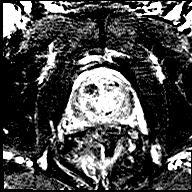
[im 279/580]
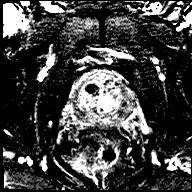
[im 301/580]
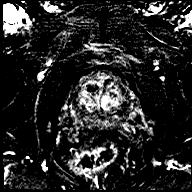
[im 322/580]
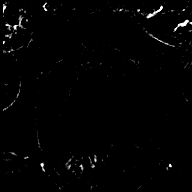
[im 344/580]
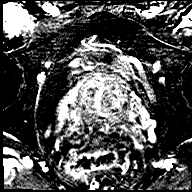
[im 365/580]
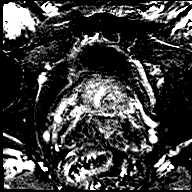
[im 387/580]
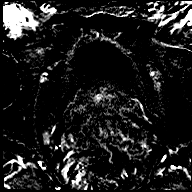
[im 408/580]
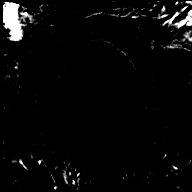
[im 429/580]
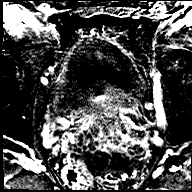
[im 451/580]
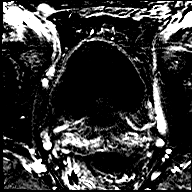
[im 472/580]
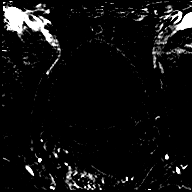
[im 494/580]
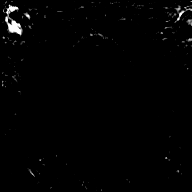
[im 515/580]
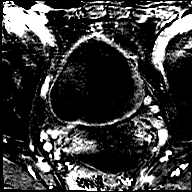
[im 537/580]
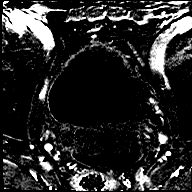
[im 558/580]
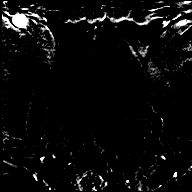
[im 580/580]
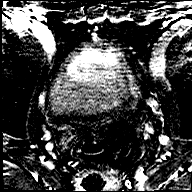

[Series 14: t1_vibe_dixon_tra_f · axial · 2.5mm · 0.91mm/px · z∈[-189,+8]mm · 4 of 80 slices shown]
[im 1/80]
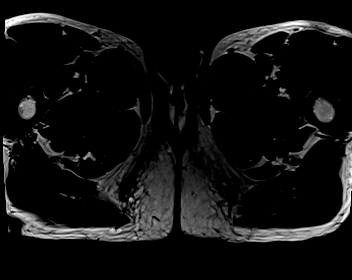
[im 27/80]
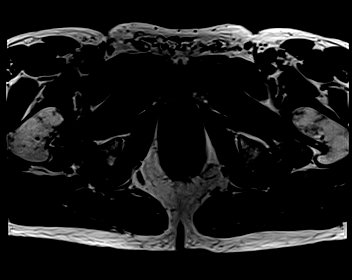
[im 53/80]
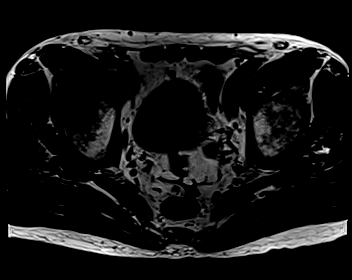
[im 80/80]
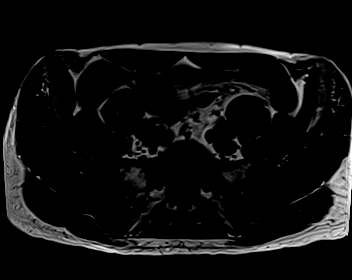

[Series 15: t1_vibe_dixon_tra_w · axial · 2.5mm · 0.91mm/px · z∈[-189,+8]mm · 4 of 80 slices shown]
[im 1/80]
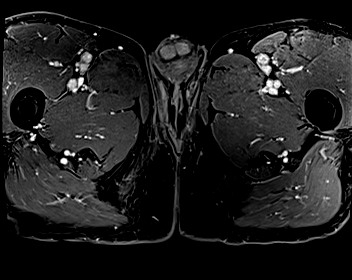
[im 27/80]
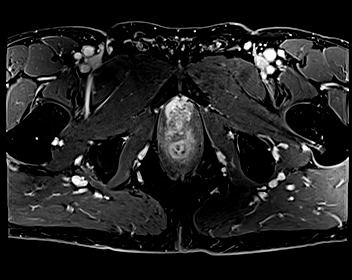
[im 53/80]
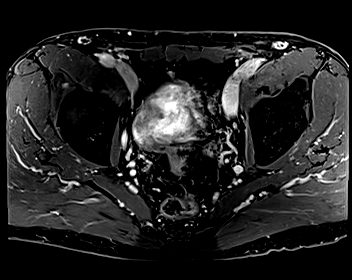
[im 80/80]
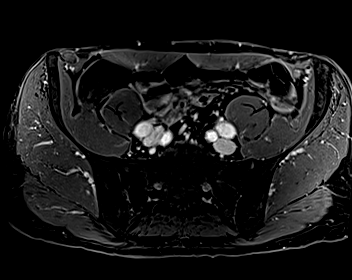

[48 of 48 positions shown; findings below may reference images not displayed]

FINDINGS: Prostate: No findings suspicious for high-grade macroscopic prostate
cancer. Specifically, no restricted diffusion/low ADC. No early
arterial enhancement.

There is very mild linear low T2 signal at the extreme right apex of
the peripheral zone (series 9/images 19-20), but this is non
masslike, and favors post inflammatory scarring (possibly related to
prior prostatitis). Low-grade prostate cancer is technically
possible but is considered unlikely. PIRADS 2.

Enlargement/nodularity of the central gland, suggesting BPH. Well
circumscribed BPH nodules, without suspicious/smudgy appearance on
T2.

Volume: 4.0 x 5.2 x 4.1 cm (volume = 45 mL)

Transcapsular spread:  Absent.

Seminal vesicle involvement: Absent.

Neurovascular bundle involvement: Absent.

Pelvic adenopathy: Absent. Small bilateral inguinal nodes measuring
up to 7 mm short axis on the left, likely reactive.

Bone metastasis: Absent.

Other findings: Mildly thick-walled bladder, although
underdistended. Small volume pelvic ascites, nonspecific.
IMPRESSION: No findings suspicious for high-grade macroscopic prostate cancer.

Suspected post inflammatory scarring at the extreme right apex, non
masslike and favoring sequela of prior prostatitis. PI-RADS 2.

Suspected BPH.  Calculated prostate volume 45 mL.

## 2020-03-05 MED ORDER — GADOBENATE DIMEGLUMINE 529 MG/ML IV SOLN
17.0000 mL | Freq: Once | INTRAVENOUS | Status: AC | PRN
  Administered 2020-03-05: 17 mL via INTRAVENOUS

## 2020-03-05 MED ORDER — GADOBENATE DIMEGLUMINE 529 MG/ML IV SOLN: 17.0000 mL | Freq: Once | INTRAVENOUS | Status: DC | PRN

## 2020-03-07 ENCOUNTER — Other Ambulatory Visit: Payer: Commercial Managed Care - PPO

## 2020-03-20 ENCOUNTER — Other Ambulatory Visit: Payer: Self-pay

## 2020-03-20 ENCOUNTER — Ambulatory Visit (HOSPITAL_COMMUNITY)
Admission: RE | Admit: 2020-03-20 | Discharge: 2020-03-20 | Disposition: A | Discharge status: Home / Self Care | Payer: Commercial Managed Care - PPO | Source: Ambulatory Visit | Attending: Physician Assistant | Admitting: Physician Assistant

## 2020-03-20 VITALS — BP 104/66 | HR 65 | Ht 73.0 in | Wt 190.8 lb

## 2020-03-20 DIAGNOSIS — I4819 Other persistent atrial fibrillation: Secondary | ICD-10-CM | POA: Insufficient documentation

## 2020-03-20 DIAGNOSIS — Z7901 Long term (current) use of anticoagulants: Secondary | ICD-10-CM | POA: Insufficient documentation

## 2020-03-20 DIAGNOSIS — I5020 Unspecified systolic (congestive) heart failure: Secondary | ICD-10-CM | POA: Insufficient documentation

## 2020-03-20 DIAGNOSIS — G4733 Obstructive sleep apnea (adult) (pediatric): Secondary | ICD-10-CM | POA: Diagnosis not present

## 2020-03-20 DIAGNOSIS — Z79899 Other long term (current) drug therapy: Secondary | ICD-10-CM | POA: Diagnosis not present

## 2020-03-20 DIAGNOSIS — D6869 Other thrombophilia: Secondary | ICD-10-CM | POA: Diagnosis not present

## 2020-03-20 DIAGNOSIS — E785 Hyperlipidemia, unspecified: Secondary | ICD-10-CM | POA: Diagnosis not present

## 2020-03-20 DIAGNOSIS — Z8249 Family history of ischemic heart disease and other diseases of the circulatory system: Secondary | ICD-10-CM | POA: Insufficient documentation

## 2020-03-20 LAB — CBC
HCT: 45.8 % (ref 39.0–52.0)
Hemoglobin: 15.1 g/dL (ref 13.0–17.0)
MCH: 33.5 pg (ref 26.0–34.0)
MCHC: 33 g/dL (ref 30.0–36.0)
MCV: 101.6 fL — ABNORMAL HIGH (ref 80.0–100.0)
Platelets: 202 10*3/uL (ref 150–400)
RBC: 4.51 MIL/uL (ref 4.22–5.81)
RDW: 12 % (ref 11.5–15.5)
WBC: 9.4 10*3/uL (ref 4.0–10.5)
nRBC: 0 % (ref 0.0–0.2)

## 2020-03-20 LAB — BASIC METABOLIC PANEL
Anion gap: 8 (ref 5–15)
BUN: 22 mg/dL (ref 8–23)
CO2: 23 mmol/L (ref 22–32)
Calcium: 9.7 mg/dL (ref 8.9–10.3)
Chloride: 108 mmol/L (ref 98–111)
Creatinine, Ser: 1.4 mg/dL — ABNORMAL HIGH (ref 0.61–1.24)
GFR, Estimated: 55 mL/min — ABNORMAL LOW (ref >60)
Glucose, Bld: 105 mg/dL — ABNORMAL HIGH (ref 70–99)
Potassium: 4.7 mmol/L (ref 3.5–5.1)
Sodium: 139 mmol/L (ref 135–145)

## 2020-03-20 NOTE — H&P (View-Only) (Signed)
Primary Care Physician: Geoffry Paradise, MD Primary Electrophysiologist: Dr Graciela Husbands Referring Physician: Francis Dowse   Steven Gray is a 69 y.o. male with a history of HLD, OSA, persistent atrial fibrillation, systolic CHF  who presents for follow up in the [facility].  The patient was initially diagnosed with atrial fibrillation 06/03/19 after presenting to the ER with a 74-month onset of worsening dyspnea, orthopnea and PND.  Patient was previously seen by Dr. Excell Seltzer in 2014 for palpitation.  ETT at the time was low risk.  Heart monitor showed sinus rhythm with PAC and PVC, otherwise no significant arrhythmia. On arrival to the ED at [facility] on 06/03/2019, he was noted to be in new atrial fibrillation with RVR with heart rate of 110-120s. Echo showed reduced EF 30-35% with a severely dilated LA. Patient was started on Eliquis for a CHADS2VASC score of 2. Plan was for patient to undergo TEE/DCCV but TEE showed dense smoke and LA thrombus and so DCCV was not attempted. He was discharged on Toprol and digoxin for rate control. Patient underwent TEE guided DCCV on 07/04/19 which was successful. Repeat echo and cardiac MRI showed normalized EF with severe biatrial enlargement. Patient had done well until the week of 11/20/19 when his smart watch again showed afib. He reports good rate control and has been asymptomatic. Patient is s/p DCCV on 11/30/19.   On follow up today, patient noted he was back in afib 03/19/20 after playing tennis. He has symptoms of palpitations. There were no specific triggers that he could identify. He denies any missed doses of anticoagulation.   Today, he denies symptoms of chest pain, orthopnea, PND, lower extremity edema, dizziness, presyncope, syncope, bleeding, or neurologic sequela. The patient is tolerating medications without difficulties and is otherwise without complaint today.    Atrial Fibrillation Risk Factors:  he does have  symptoms or diagnosis of sleep apnea. he is compliant with CPAP therapy. he does not have a history of rheumatic fever. he does have a history of alcohol use. The patient does not have a history of early familial atrial fibrillation or other arrhythmias.  he has a BMI of Body mass index is 25.17 kg/m.Marland Kitchen Filed Weights   03/20/20 1501  Weight: 86.5 kg    Family History  Problem Relation Age of Onset  . Polycystic kidney disease Mother   . Vascular Disease Father        vascular dementia  . CAD Maternal Grandfather   . Atrial fibrillation Neg Hx      Atrial Fibrillation Management history:  Previous antiarrhythmic drugs: none Previous cardioversions: 07/04/19, 11/30/19 Previous ablations: none CHADS2VASC score: 2 Anticoagulation history: Eliquis   Past Medical History:  Diagnosis Date  . Acute systolic CHF (congestive heart failure) (HCC) 06/03/2019  . Alcohol use 06/03/2019  . Atrial fibrillation (HCC) 06/03/2019  . 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 Heart palpitations 11/30/2012  . Hyperlipidemia    does not take medication for this, denies  . Increased prostate specific antigen (PSA) velocity   . Right knee pain 12/07/2016  . Sleep apnea    wears CPAP   Past Surgical History:  Procedure Laterality Date  . CARDIOVERSION N/A 07/04/2019   Procedure: CARDIOVERSION;  Surgeon: Quintella Reichert, MD;  Location: [facility] ENDOSCOPY;  Service: Cardiovascular;  Laterality: N/A;  . CARDIOVERSION N/A 11/30/2019   Procedure: CARDIOVERSION;  Surgeon: Lewayne Bunting, MD;  Location: [facility] ENDOSCOPY;  Service: Cardiovascular;  Laterality: N/A;  .  MENISCUS REPAIR    . TEE WITHOUT CARDIOVERSION N/A 06/05/2019   Procedure: TRANSESOPHAGEAL ECHOCARDIOGRAM (TEE);  Surgeon: Wendall Stade, MD;  Location: [facility] ENDOSCOPY;  Service: Cardiovascular;  Laterality: N/A;  . TEE WITHOUT CARDIOVERSION N/A 07/04/2019   Procedure: TRANSESOPHAGEAL ECHOCARDIOGRAM (TEE);  Surgeon: Quintella Reichert, MD;  Location:  [facility] ENDOSCOPY;  Service: Cardiovascular;  Laterality: N/A;    Current Outpatient Medications  Medication Sig Dispense Refill  . atorvastatin (LIPITOR) 40 MG tablet TAKE 1 TABLET(40 MG) BY MOUTH DAILY AT 6 PM (Patient taking differently: Take 40 mg by mouth daily. ) 30 tablet 11  . ELIQUIS 5 MG TABS tablet TAKE 1 TABLET(5 MG) BY MOUTH TWICE DAILY 60 tablet 5  . losartan (COZAAR) 25 MG tablet TAKE 1 TABLET(25 MG) BY MOUTH AT BEDTIME (Patient taking differently: Take 25 mg by mouth at bedtime. ) 30 tablet 11  . metoprolol succinate (TOPROL-XL) 50 MG 24 hr tablet Take 0.5 tablets (25 mg total) by mouth daily. Take with or immediately following a meal. 45 tablet 3  . triamcinolone cream (KENALOG) 0.1 % Apply 1 application topically as needed.     . zolpidem (AMBIEN) 10 MG tablet Take 3.33 mg by mouth at bedtime as needed for sleep. 1/3 tablet     No current facility-administered medications for this encounter.    No Known Allergies  Social History   Socioeconomic History  . Marital status: Married    Spouse name: Not on file  . Number of children: Not on file  . Years of education: Not on file  . Highest education level: Not on file  Occupational History  . Occupation: warehousing  Tobacco Use  . Smoking status: Never Smoker  . Smokeless tobacco: Never Used  Substance and Sexual Activity  . Alcohol use: Not Currently    Comment: 15+ drinks/week wine  . Drug use: No  . Sexual activity: Not on file  Other Topics Concern  . Not on file  Social History Narrative   Married with 2 sons, nonsmoker, social ETOH, works as Psychologist, educational in Geologist, engineering. Frequent exercise.   Social Determinants of Health   Financial Resource Strain:   . Difficulty of Paying Living Expenses: Not on file  Food Insecurity:   . Worried About Programme researcher, broadcasting/film/video in the Last Year: Not on file  . Ran Out of Food in the Last Year: Not on file  Transportation Needs:   . Lack of Transportation (Medical): Not on  file  . Lack of Transportation (Non-Medical): Not on file  Physical Activity:   . Days of Exercise per Week: Not on file  . Minutes of Exercise per Session: Not on file  Stress:   . Feeling of Stress : Not on file  Social Connections:   . Frequency of Communication with Friends and Family: Not on file  . Frequency of Social Gatherings with Friends and Family: Not on file  . Attends Religious Services: Not on file  . Active Member of Clubs or Organizations: Not on file  . Attends Banker Meetings: Not on file  . Marital Status: Not on file  Intimate Partner Violence:   . Fear of Current or Ex-Partner: Not on file  . Emotionally Abused: Not on file  . Physically Abused: Not on file  . Sexually Abused: Not on file     ROS- All systems are reviewed and negative except as per the HPI above.  Physical Exam: Vitals:   03/20/20 1501  BP:  104/66  Pulse: 65  Weight: 86.5 kg  Height: 6\' 1"  (1.854 m)    GEN- The patient is well appearing, alert and oriented x 3 today.   HEENT-head normocephalic, atraumatic, sclera clear, conjunctiva pink, hearing intact, trachea midline. Lungs- Clear to ausculation bilaterally, normal work of breathing Heart- irregular rate and rhythm, no murmurs, rubs or gallops  GI- soft, NT, ND, + BS Extremities- no clubbing, cyanosis, or edema MS- no significant deformity or atrophy Skin- no rash or lesion Psych- euthymic mood, full affect Neuro- strength and sensation are intact   Wt Readings from Last 3 Encounters:  03/20/20 86.5 kg  01/22/20 85.7 kg  12/18/19 85.7 kg    EKG today demonstrates afib HR 65, QRS 88, QTc 428  Echo 06/03/19 demonstrated  1. Left ventricular ejection fraction, by visual estimation, is 30 to  35%. The left ventricle has moderate to severely decreased function. There  is no left ventricular hypertrophy.  2. Left ventricular diastolic parameters are indeterminate.  3. The left ventricle demonstrates global  hypokinesis.  4. Atrial fibrillation makes accurate assessment of LVEF diffciult.  5. Global right ventricle has moderately reduced systolic function.The  right ventricular size is normal. No increase in right ventricular wall  thickness.  6. Left atrial size was severely dilated.  7. Right atrial size was normal.  8. The mitral valve is abnormal. Mild mitral valve regurgitation.  9. The tricuspid valve is normal in structure.  10. The tricuspid valve is normal in structure. Tricuspid valve  regurgitation is moderate.  11. The aortic valve is grossly normal. Aortic valve regurgitation is not  visualized.  12. Pulmonic regurgitation is mild.  13. The pulmonic valve was normal in structure. Pulmonic valve  regurgitation is mild.  14. Aortic dilatation noted.  15. There is mild dilatation of the ascending aorta.  16. Moderately elevated pulmonary artery systolic pressure.  17. The interatrial septum was not well visualized.   Epic records are reviewed at length today  CHA2DS2-VASc Score = 2  The patient's score is based upon: CHF History: 1 HTN History: 0 Diabetes History: 0 Stroke History: 0 Vascular Disease History: 0      ASSESSMENT AND PLAN: 1. Persistent Atrial Fibrillation (ICD10:  I48.19) The patient's CHA2DS2-VASc score is 2, indicating a 2.2% annual risk of stroke.   Patient back in rate controlled afib.  Will arrange for DCCV. Check bmet/CBC Would have low threshold to consider dofetilide vs ablation given this will be his 3rd DCCV this year.   Continue Eliquis 5 mg BID Continue Toprol 25 mg daily  2. Secondary Hypercoagulable State (ICD10:  D68.69) The patient is at significant risk for stroke/thromboembolism based upon his CHA2DS2-VASc Score of 2.  Continue Apixaban (Eliquis).     3. Obstructive sleep apnea Patient reports compliance with CPAP therapy.  4. Systolic dysfunction Suspect tachycardia mediated.  Resolved on cardiac MRI and echo after  DCCV.   Follow up with Dr 06/05/19 post DCCV.    Graciela Husbands PA-C Afib Clinic [facility] [address] [place], [place] [phone] 03/20/2020 4:01 PM

## 2020-03-20 NOTE — Patient Instructions (Signed)
Cardioversion scheduled for Tuesday, November 30th  - Arrive at the [facility] and go to admitting at [facility] not eat or drink anything after midnight the night prior to your procedure.  - Take all your morning medication (except diabetic medications) with a sip of water prior to arrival.  - You will not be able to drive home after your procedure.  - Do NOT miss any doses of your blood thinner - if you should miss a dose please notify our office immediately.  - If you feel as if you go back into normal rhythm prior to scheduled cardioversion, please notify our office immediately. If your procedure is canceled in the cardioversion suite you will be charged a cancellation fee.  Follow up with Dr. Graciela Husbands after cardioversion - his office will call to setup appointment.

## 2020-03-20 NOTE — Progress Notes (Signed)
Primary Care Physician: Geoffry Paradise, MD Primary Electrophysiologist: Dr Graciela Husbands Referring Physician: Francis Dowse   Steven Gray is a 69 y.o. male with a history of HLD, OSA, persistent atrial fibrillation, systolic CHF  who presents for follow up in the [facility].  The patient was initially diagnosed with atrial fibrillation 06/03/19 after presenting to the ER with a 74-month onset of worsening dyspnea, orthopnea and PND.  Patient was previously seen by Dr. Excell Seltzer in 2014 for palpitation.  ETT at the time was low risk.  Heart monitor showed sinus rhythm with PAC and PVC, otherwise no significant arrhythmia. On arrival to the ED at [facility] on 06/03/2019, he was noted to be in new atrial fibrillation with RVR with heart rate of 110-120s. Echo showed reduced EF 30-35% with a severely dilated LA. Patient was started on Eliquis for a CHADS2VASC score of 2. Plan was for patient to undergo TEE/DCCV but TEE showed dense smoke and LA thrombus and so DCCV was not attempted. He was discharged on Toprol and digoxin for rate control. Patient underwent TEE guided DCCV on 07/04/19 which was successful. Repeat echo and cardiac MRI showed normalized EF with severe biatrial enlargement. Patient had done well until the week of 11/20/19 when his smart watch again showed afib. He reports good rate control and has been asymptomatic. Patient is s/p DCCV on 11/30/19.   On follow up today, patient noted he was back in afib 03/19/20 after playing tennis. He has symptoms of palpitations. There were no specific triggers that he could identify. He denies any missed doses of anticoagulation.   Today, he denies symptoms of chest pain, orthopnea, PND, lower extremity edema, dizziness, presyncope, syncope, bleeding, or neurologic sequela. The patient is tolerating medications without difficulties and is otherwise without complaint today.    Atrial Fibrillation Risk Factors:  he does have  symptoms or diagnosis of sleep apnea. he is compliant with CPAP therapy. he does not have a history of rheumatic fever. he does have a history of alcohol use. The patient does not have a history of early familial atrial fibrillation or other arrhythmias.  he has a BMI of Body mass index is 25.17 kg/m.Marland Kitchen Filed Weights   03/20/20 1501  Weight: 86.5 kg    Family History  Problem Relation Age of Onset  . Polycystic kidney disease Mother   . Vascular Disease Father        vascular dementia  . CAD Maternal Grandfather   . Atrial fibrillation Neg Hx      Atrial Fibrillation Management history:  Previous antiarrhythmic drugs: none Previous cardioversions: 07/04/19, 11/30/19 Previous ablations: none CHADS2VASC score: 2 Anticoagulation history: Eliquis   Past Medical History:  Diagnosis Date  . Acute systolic CHF (congestive heart failure) (HCC) 06/03/2019  . Alcohol use 06/03/2019  . Atrial fibrillation (HCC) 06/03/2019  . 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 Heart palpitations 11/30/2012  . Hyperlipidemia    does not take medication for this, denies  . Increased prostate specific antigen (PSA) velocity   . Right knee pain 12/07/2016  . Sleep apnea    wears CPAP   Past Surgical History:  Procedure Laterality Date  . CARDIOVERSION N/A 07/04/2019   Procedure: CARDIOVERSION;  Surgeon: Quintella Reichert, MD;  Location: [facility] ENDOSCOPY;  Service: Cardiovascular;  Laterality: N/A;  . CARDIOVERSION N/A 11/30/2019   Procedure: CARDIOVERSION;  Surgeon: Lewayne Bunting, MD;  Location: [facility] ENDOSCOPY;  Service: Cardiovascular;  Laterality: N/A;  .  MENISCUS REPAIR    . TEE WITHOUT CARDIOVERSION N/A 06/05/2019   Procedure: TRANSESOPHAGEAL ECHOCARDIOGRAM (TEE);  Surgeon: Wendall Stade, MD;  Location: [facility] ENDOSCOPY;  Service: Cardiovascular;  Laterality: N/A;  . TEE WITHOUT CARDIOVERSION N/A 07/04/2019   Procedure: TRANSESOPHAGEAL ECHOCARDIOGRAM (TEE);  Surgeon: Quintella Reichert, MD;  Location:  [facility] ENDOSCOPY;  Service: Cardiovascular;  Laterality: N/A;    Current Outpatient Medications  Medication Sig Dispense Refill  . atorvastatin (LIPITOR) 40 MG tablet TAKE 1 TABLET(40 MG) BY MOUTH DAILY AT 6 PM (Patient taking differently: Take 40 mg by mouth daily. ) 30 tablet 11  . ELIQUIS 5 MG TABS tablet TAKE 1 TABLET(5 MG) BY MOUTH TWICE DAILY 60 tablet 5  . losartan (COZAAR) 25 MG tablet TAKE 1 TABLET(25 MG) BY MOUTH AT BEDTIME (Patient taking differently: Take 25 mg by mouth at bedtime. ) 30 tablet 11  . metoprolol succinate (TOPROL-XL) 50 MG 24 hr tablet Take 0.5 tablets (25 mg total) by mouth daily. Take with or immediately following a meal. 45 tablet 3  . triamcinolone cream (KENALOG) 0.1 % Apply 1 application topically as needed.     . zolpidem (AMBIEN) 10 MG tablet Take 3.33 mg by mouth at bedtime as needed for sleep. 1/3 tablet     No current facility-administered medications for this encounter.    No Known Allergies  Social History   Socioeconomic History  . Marital status: Married    Spouse name: Not on file  . Number of children: Not on file  . Years of education: Not on file  . Highest education level: Not on file  Occupational History  . Occupation: warehousing  Tobacco Use  . Smoking status: Never Smoker  . Smokeless tobacco: Never Used  Substance and Sexual Activity  . Alcohol use: Not Currently    Comment: 15+ drinks/week wine  . Drug use: No  . Sexual activity: Not on file  Other Topics Concern  . Not on file  Social History Narrative   Married with 2 sons, nonsmoker, social ETOH, works as Psychologist, educational in Geologist, engineering. Frequent exercise.   Social Determinants of Health   Financial Resource Strain:   . Difficulty of Paying Living Expenses: Not on file  Food Insecurity:   . Worried About Programme researcher, broadcasting/film/video in the Last Year: Not on file  . Ran Out of Food in the Last Year: Not on file  Transportation Needs:   . Lack of Transportation (Medical): Not on  file  . Lack of Transportation (Non-Medical): Not on file  Physical Activity:   . Days of Exercise per Week: Not on file  . Minutes of Exercise per Session: Not on file  Stress:   . Feeling of Stress : Not on file  Social Connections:   . Frequency of Communication with Friends and Family: Not on file  . Frequency of Social Gatherings with Friends and Family: Not on file  . Attends Religious Services: Not on file  . Active Member of Clubs or Organizations: Not on file  . Attends Banker Meetings: Not on file  . Marital Status: Not on file  Intimate Partner Violence:   . Fear of Current or Ex-Partner: Not on file  . Emotionally Abused: Not on file  . Physically Abused: Not on file  . Sexually Abused: Not on file     ROS- All systems are reviewed and negative except as per the HPI above.  Physical Exam: Vitals:   03/20/20 1501  BP:  104/66  Pulse: 65  Weight: 86.5 kg  Height: 6\' 1"  (1.854 m)    GEN- The patient is well appearing, alert and oriented x 3 today.   HEENT-head normocephalic, atraumatic, sclera clear, conjunctiva pink, hearing intact, trachea midline. Lungs- Clear to ausculation bilaterally, normal work of breathing Heart- irregular rate and rhythm, no murmurs, rubs or gallops  GI- soft, NT, ND, + BS Extremities- no clubbing, cyanosis, or edema MS- no significant deformity or atrophy Skin- no rash or lesion Psych- euthymic mood, full affect Neuro- strength and sensation are intact   Wt Readings from Last 3 Encounters:  03/20/20 86.5 kg  01/22/20 85.7 kg  12/18/19 85.7 kg    EKG today demonstrates afib HR 65, QRS 88, QTc 428  Echo 06/03/19 demonstrated  1. Left ventricular ejection fraction, by visual estimation, is 30 to  35%. The left ventricle has moderate to severely decreased function. There  is no left ventricular hypertrophy.  2. Left ventricular diastolic parameters are indeterminate.  3. The left ventricle demonstrates global  hypokinesis.  4. Atrial fibrillation makes accurate assessment of LVEF diffciult.  5. Global right ventricle has moderately reduced systolic function.The  right ventricular size is normal. No increase in right ventricular wall  thickness.  6. Left atrial size was severely dilated.  7. Right atrial size was normal.  8. The mitral valve is abnormal. Mild mitral valve regurgitation.  9. The tricuspid valve is normal in structure.  10. The tricuspid valve is normal in structure. Tricuspid valve  regurgitation is moderate.  11. The aortic valve is grossly normal. Aortic valve regurgitation is not  visualized.  12. Pulmonic regurgitation is mild.  13. The pulmonic valve was normal in structure. Pulmonic valve  regurgitation is mild.  14. Aortic dilatation noted.  15. There is mild dilatation of the ascending aorta.  16. Moderately elevated pulmonary artery systolic pressure.  17. The interatrial septum was not well visualized.   Epic records are reviewed at length today  CHA2DS2-VASc Score = 2  The patient's score is based upon: CHF History: 1 HTN History: 0 Diabetes History: 0 Stroke History: 0 Vascular Disease History: 0      ASSESSMENT AND PLAN: 1. Persistent Atrial Fibrillation (ICD10:  I48.19) The patient's CHA2DS2-VASc score is 2, indicating a 2.2% annual risk of stroke.   Patient back in rate controlled afib.  Will arrange for DCCV. Check bmet/CBC Would have low threshold to consider dofetilide vs ablation given this will be his 3rd DCCV this year.   Continue Eliquis 5 mg BID Continue Toprol 25 mg daily  2. Secondary Hypercoagulable State (ICD10:  D68.69) The patient is at significant risk for stroke/thromboembolism based upon his CHA2DS2-VASc Score of 2.  Continue Apixaban (Eliquis).     3. Obstructive sleep apnea Patient reports compliance with CPAP therapy.  4. Systolic dysfunction Suspect tachycardia mediated.  Resolved on cardiac MRI and echo after  DCCV.   Follow up with Dr 06/05/19 post DCCV.    Graciela Husbands PA-C Afib Clinic [facility] [address] [place], [place] [phone] 03/20/2020 4:01 PM

## 2020-03-30 ENCOUNTER — Other Ambulatory Visit (HOSPITAL_COMMUNITY)
Admission: RE | Admit: 2020-03-30 | Discharge: 2020-03-30 | Disposition: A | Discharge status: Home / Self Care | Payer: Commercial Managed Care - PPO | Source: Ambulatory Visit | Attending: Cardiovascular Disease | Admitting: Cardiovascular Disease

## 2020-03-30 DIAGNOSIS — Z01812 Encounter for preprocedural laboratory examination: Secondary | ICD-10-CM | POA: Diagnosis not present

## 2020-03-30 DIAGNOSIS — Z20822 Contact with and (suspected) exposure to covid-19: Secondary | ICD-10-CM | POA: Insufficient documentation

## 2020-03-31 LAB — SARS CORONAVIRUS 2 (TAT 6-24 HRS): SARS Coronavirus 2: NEGATIVE

## 2020-04-02 ENCOUNTER — Ambulatory Visit (HOSPITAL_COMMUNITY)
Admission: RE | Admit: 2020-04-02 | Discharge: 2020-04-02 | Disposition: A | Discharge status: Home / Self Care | Payer: Commercial Managed Care - PPO | Attending: Cardiovascular Disease | Admitting: Cardiovascular Disease

## 2020-04-02 ENCOUNTER — Encounter (HOSPITAL_COMMUNITY)
Admission: RE | Disposition: A | Discharge status: Home / Self Care | Payer: Self-pay | Source: Home / Self Care | Attending: Cardiovascular Disease

## 2020-04-02 ENCOUNTER — Other Ambulatory Visit: Payer: Self-pay

## 2020-04-02 ENCOUNTER — Ambulatory Visit (HOSPITAL_COMMUNITY): Payer: Commercial Managed Care - PPO | Admitting: Certified Registered Nurse Anesthetist

## 2020-04-02 ENCOUNTER — Encounter (HOSPITAL_COMMUNITY): Payer: Self-pay | Admitting: Cardiovascular Disease

## 2020-04-02 DIAGNOSIS — D6869 Other thrombophilia: Secondary | ICD-10-CM | POA: Insufficient documentation

## 2020-04-02 DIAGNOSIS — Z79899 Other long term (current) drug therapy: Secondary | ICD-10-CM | POA: Insufficient documentation

## 2020-04-02 DIAGNOSIS — I4819 Other persistent atrial fibrillation: Secondary | ICD-10-CM

## 2020-04-02 DIAGNOSIS — Z7901 Long term (current) use of anticoagulants: Secondary | ICD-10-CM | POA: Insufficient documentation

## 2020-04-02 DIAGNOSIS — I5022 Chronic systolic (congestive) heart failure: Secondary | ICD-10-CM | POA: Insufficient documentation

## 2020-04-02 DIAGNOSIS — E785 Hyperlipidemia, unspecified: Secondary | ICD-10-CM | POA: Insufficient documentation

## 2020-04-02 DIAGNOSIS — G4733 Obstructive sleep apnea (adult) (pediatric): Secondary | ICD-10-CM | POA: Diagnosis not present

## 2020-04-02 HISTORY — PX: CARDIOVERSION: SHX1299

## 2020-04-02 SURGERY — CARDIOVERSION
Anesthesia: General

## 2020-04-02 MED ORDER — LIDOCAINE 2% (20 MG/ML) 5 ML SYRINGE
INTRAMUSCULAR | Status: DC | PRN
  Administered 2020-04-02: 40 mg via INTRAVENOUS

## 2020-04-02 MED ORDER — SODIUM CHLORIDE 0.9 % IV SOLN: INTRAVENOUS | Status: DC | PRN

## 2020-04-02 MED ORDER — APIXABAN 5 MG PO TABS
5.0000 mg | ORAL_TABLET | Freq: Once | ORAL | Status: AC
  Administered 2020-04-02: 5 mg via ORAL
  Filled 2020-04-02: qty 1

## 2020-04-02 MED ORDER — PROPOFOL 10 MG/ML IV BOLUS
INTRAVENOUS | Status: DC | PRN
  Administered 2020-04-02: 80 mg via INTRAVENOUS

## 2020-04-02 NOTE — CV Procedure (Signed)
Electrical Cardioversion Procedure Note JAQUE DACY [id number] Oct 04, 1950  Procedure: Electrical Cardioversion Indications:  Atrial Fibrillation  Procedure Details Consent: Risks of procedure as well as the alternatives and risks of each were explained to the (patient/caregiver).  Consent for procedure obtained. Time Out: Verified patient identification, verified procedure, site/side was marked, verified correct patient position, special equipment/implants available, medications/allergies/relevent history reviewed, required imaging and test results available.  Performed  Patient placed on cardiac monitor, pulse oximetry, supplemental oxygen as necessary.  Sedation given: propofol Pacer pads placed anterior and posterior chest.  Cardioverted 1 time(s).  Cardioverted at 150J.  Evaluation Findings: Post procedure EKG shows: sinus bradycardia Complications: None Patient did tolerate procedure well.   Chilton Si, MD 04/02/2020, 7:33 AM

## 2020-04-02 NOTE — Transfer of Care (Signed)
Immediate Anesthesia Transfer of Care Note  Patient: Steven Gray  Procedure(s) Performed: CARDIOVERSION (N/A )  Patient Location: Endoscopy Unit  Anesthesia Type:General  Level of Consciousness: awake, alert  and oriented  Airway & Oxygen Therapy: Patient Spontanous Breathing  Post-op Assessment: Report given to RN and Post -op Vital signs reviewed and stable  Post vital signs: Reviewed and stable  Last Vitals:  Vitals Value Taken Time  BP    Temp    Pulse 54 04/02/20 0736  Resp 21 04/02/20 0736  SpO2 97 % 04/02/20 0736  Vitals shown include unvalidated device data.  Last Pain:  Vitals:   04/02/20 0644  TempSrc: Oral  PainSc: 0-No pain         Complications: No complications documented.

## 2020-04-02 NOTE — Interval H&P Note (Signed)
History and Physical Interval Note:  04/02/2020 7:21 AM  Steven Gray  has presented today for surgery, with the diagnosis of A-FIB.  The various methods of treatment have been discussed with the patient and family. After consideration of risks, benefits and other options for treatment, the patient has consented to  Procedure(s): CARDIOVERSION (N/A) as a surgical intervention.  The patient's history has been reviewed, patient examined, no change in status, stable for surgery.  I have reviewed the patient's chart and labs.  Questions were answered to the patient's satisfaction.     Chilton Si, MD

## 2020-04-02 NOTE — Anesthesia Procedure Notes (Signed)
Procedure Name: General with mask airway Performed by: Jenese Mischke B, CRNA Pre-anesthesia Checklist: Patient identified, Emergency Drugs available, Suction available, Patient being monitored and Timeout performed Patient Re-evaluated:Patient Re-evaluated prior to induction Oxygen Delivery Method: Ambu bag Preoxygenation: Pre-oxygenation with 100% oxygen Induction Type: IV induction Placement Confirmation: positive ETCO2 Dental Injury: Teeth and Oropharynx as per pre-operative assessment        

## 2020-04-02 NOTE — Discharge Instructions (Signed)

## 2020-04-02 NOTE — Anesthesia Preprocedure Evaluation (Addendum)
Anesthesia Evaluation  Patient identified by MRN, date of birth, ID band Patient awake    Reviewed: Allergy & Precautions, NPO status , Patient's Chart, lab work & pertinent test results  Airway Mallampati: II  TM Distance: >3 FB Neck ROM: Full    Dental  (+) Teeth Intact, Dental Advisory Given   Pulmonary    breath sounds clear to auscultation       Cardiovascular  Rhythm:Irregular Rate:Normal     Neuro/Psych    GI/Hepatic   Endo/Other    Renal/GU      Musculoskeletal   Abdominal   Peds  Hematology   Anesthesia Other Findings   Reproductive/Obstetrics                             Anesthesia Physical Anesthesia Plan  ASA: III  Anesthesia Plan: General   Post-op Pain Management:    Induction: Intravenous  PONV Risk Score and Plan:   Airway Management Planned: Mask  Additional Equipment:   Intra-op Plan:   Post-operative Plan:   Informed Consent: I have reviewed the patients History and Physical, chart, labs and discussed the procedure including the risks, benefits and alternatives for the proposed anesthesia with the patient or authorized representative who has indicated his/her understanding and acceptance.     Dental advisory given  Plan Discussed with: CRNA and Anesthesiologist  Anesthesia Plan Comments:         Anesthesia Quick Evaluation

## 2020-04-02 NOTE — Anesthesia Postprocedure Evaluation (Signed)
Anesthesia Post Note  Patient: Steven Gray Situ  Procedure(s) Performed: CARDIOVERSION (N/A )     Patient location during evaluation: PACU Anesthesia Type: General Level of consciousness: awake and alert Pain management: pain level controlled Vital Signs Assessment: post-procedure vital signs reviewed and stable Respiratory status: spontaneous breathing, nonlabored ventilation, respiratory function stable and patient connected to nasal cannula oxygen Cardiovascular status: blood pressure returned to baseline and stable Postop Assessment: no apparent nausea or vomiting Anesthetic complications: no   No complications documented.  Last Vitals:  Vitals:   04/02/20 0745 04/02/20 0756  BP: 109/75 111/80  Pulse: (!) 50 (!) 49  Resp: 15 16  Temp:    SpO2: 97% 98%    Last Pain:  Vitals:   04/02/20 0756  TempSrc:   PainSc: 0-No pain                 Enoch Moffa COKER

## 2020-04-23 ENCOUNTER — Ambulatory Visit: Payer: Commercial Managed Care - PPO | Admitting: Internal Medicine

## 2020-04-23 ENCOUNTER — Other Ambulatory Visit: Payer: Self-pay

## 2020-04-23 ENCOUNTER — Encounter: Payer: Self-pay | Admitting: Internal Medicine

## 2020-04-23 VITALS — BP 130/70 | HR 51 | Ht 73.0 in | Wt 192.0 lb

## 2020-04-23 DIAGNOSIS — I4819 Other persistent atrial fibrillation: Secondary | ICD-10-CM | POA: Diagnosis not present

## 2020-04-23 NOTE — Progress Notes (Signed)
Patient Care Team: Steven Paradise, MD as PCP - General (Internal Medicine)   HPI  Steven Gray is a 69 y.o. male seen following presentation with congestive heart failure found to be in atrial fibrillation with a rapid rate and with a cardiomyopathy with depressed LV function.  Initial efforts at TEE cardioversion were complicated by the presence of left atrial clot.  He was also found to have severe left atrial enlargement.  4 weeks of anticoagulation resulted in resolution of the clot and he underwent cardioversion and feels much much better.    Taking beta-blockers and losartan.  Interval atrial fibrillation 7/21 anda again 11/21 He is at this point unaware of symptoms when he is in atrial fibrillation and notes no change in his exercise tolerance  Tolerating Apixoban          DATE TEST EF   1/21 Echo  30-35 % LAE-severe (62ml/m2)  *3/21 TEE 30-35 %   4/21 Echo  60-65%   6/21 cMRI 62% LAE mod-RAE mild LGE neg   Date Cr K Hgb  2/21 1.36 4.4 15.8  7/21  1.19 4.0 14.9   Thromboembolic risk factors ( age -23 , CHF-0.5) for a CHADSVASc Score of 1.5   Records and Results Reviewed   Past Medical History:  Diagnosis Date  . Acute systolic CHF (congestive heart failure) (HCC) 06/03/2019  . Alcohol use 06/03/2019  . Atrial fibrillation (HCC) 06/03/2019  . 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 Heart palpitations 11/30/2012  . Hyperlipidemia    does not take medication for this, denies  . Increased prostate specific antigen (PSA) velocity   . Right knee pain 12/07/2016  . Sleep apnea    wears CPAP    Past Surgical History:  Procedure Laterality Date  . CARDIOVERSION N/A 07/04/2019   Procedure: CARDIOVERSION;  Surgeon: Quintella Reichert, MD;  Location: [facility] ENDOSCOPY;  Service: Cardiovascular;  Laterality: N/A;  . CARDIOVERSION N/A 11/30/2019   Procedure: CARDIOVERSION;  Surgeon: Lewayne Bunting, MD;  Location: [facility] ENDOSCOPY;  Service: Cardiovascular;   Laterality: N/A;  . CARDIOVERSION N/A 04/02/2020   Procedure: CARDIOVERSION;  Surgeon: Chilton Si, MD;  Location: [facility] ENDOSCOPY;  Service: Cardiovascular;  Laterality: N/A;  . MENISCUS REPAIR    . TEE WITHOUT CARDIOVERSION N/A 06/05/2019   Procedure: TRANSESOPHAGEAL ECHOCARDIOGRAM (TEE);  Surgeon: Wendall Stade, MD;  Location: [facility] ENDOSCOPY;  Service: Cardiovascular;  Laterality: N/A;  . TEE WITHOUT CARDIOVERSION N/A 07/04/2019   Procedure: TRANSESOPHAGEAL ECHOCARDIOGRAM (TEE);  Surgeon: Quintella Reichert, MD;  Location: [facility] ENDOSCOPY;  Service: Cardiovascular;  Laterality: N/A;    Current Meds  Medication Sig  . atorvastatin (LIPITOR) 40 MG tablet TAKE 1 TABLET(40 MG) BY MOUTH DAILY AT 6 PM  . ELIQUIS 5 MG TABS tablet TAKE 1 TABLET(5 MG) BY MOUTH TWICE DAILY  . losartan (COZAAR) 25 MG tablet TAKE 1 TABLET(25 MG) BY MOUTH AT BEDTIME  . metoprolol succinate (TOPROL-XL) 50 MG 24 hr tablet Take 0.5 tablets (25 mg total) by mouth daily. Take with or immediately following a meal.  . triamcinolone cream (KENALOG) 0.1 % Apply 1 application topically daily as needed (Rash).   . zolpidem (AMBIEN) 10 MG tablet Take 3.33 mg by mouth at bedtime as needed for sleep. 1/3 tablet    No Known Allergies    Review of Systems negative except from HPI and PMH  Physical Exam BP 130/70 (BP Location: Left Arm, Patient Position: Sitting, Cuff Size: Normal)  Pulse (!) 51   Ht 6\' 1"  (1.854 m)   Wt 192 lb (87.1 kg)   SpO2 97%   BMI 25.33 kg/m  Well developed and nourished in no acute distress HENT normal Neck supple with JVP-  flat   Clear Regular rate and rhythm, no murmurs or gallops Abd-soft with active BS No Clubbing cyanosis edema Skin-warm and dry A & Oriented  Grossly normal sensory and motor function  ECG sinus @ 51 17/09/47   CrCl cannot be calculated (Patient's most recent lab result is older than the maximum 21 days allowed.).   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  Essentially asymptomatic.  Discussed the role of catheter ablation.  Will reach out to Dr. 19/09/47 to see thoughts regarding ablation in asymptomatic patients with recurrent persistent A. Fib  Discussed other strategies including intermittent cardioversion to which she is inclined, the use of flecainide cocktail but the difficulties were having in the Covid era to get the first dose done under telemetry in the ER

## 2020-04-23 NOTE — Patient Instructions (Signed)

## 2020-05-06 ENCOUNTER — Other Ambulatory Visit: Payer: Self-pay

## 2020-05-06 ENCOUNTER — Encounter: Payer: Self-pay | Admitting: *Deleted

## 2020-05-06 ENCOUNTER — Ambulatory Visit (INDEPENDENT_AMBULATORY_CARE_PROVIDER_SITE_OTHER): Payer: Commercial Managed Care - PPO | Admitting: Internal Medicine

## 2020-05-06 ENCOUNTER — Encounter: Payer: Self-pay | Admitting: Internal Medicine

## 2020-05-06 VITALS — BP 120/78 | HR 47 | Ht 73.0 in | Wt 194.4 lb

## 2020-05-06 DIAGNOSIS — I4891 Unspecified atrial fibrillation: Secondary | ICD-10-CM

## 2020-05-06 DIAGNOSIS — I428 Other cardiomyopathies: Secondary | ICD-10-CM

## 2020-05-06 DIAGNOSIS — I4819 Other persistent atrial fibrillation: Secondary | ICD-10-CM | POA: Diagnosis not present

## 2020-05-06 DIAGNOSIS — D6869 Other thrombophilia: Secondary | ICD-10-CM

## 2020-05-06 DIAGNOSIS — G4733 Obstructive sleep apnea (adult) (pediatric): Secondary | ICD-10-CM | POA: Diagnosis not present

## 2020-05-06 LAB — BASIC METABOLIC PANEL
BUN/Creatinine Ratio: 21 (ref 10–24)
BUN: 21 mg/dL (ref 8–27)
CO2: 28 mmol/L (ref 20–29)
Calcium: 10.3 mg/dL — ABNORMAL HIGH (ref 8.6–10.2)
Chloride: 106 mmol/L (ref 96–106)
Creatinine, Ser: 1.02 mg/dL (ref 0.76–1.27)
GFR calc Af Amer: 87 mL/min/{1.73_m2} (ref >59)
GFR calc non Af Amer: 75 mL/min/{1.73_m2} (ref >59)
Glucose: 99 mg/dL (ref 65–99)
Potassium: 4.8 mmol/L (ref 3.5–5.2)
Sodium: 142 mmol/L (ref 134–144)

## 2020-05-06 LAB — CBC WITH DIFFERENTIAL/PLATELET
Basophils Absolute: 0 10*3/uL (ref 0.0–0.2)
Basos: 0 %
EOS (ABSOLUTE): 0.2 10*3/uL (ref 0.0–0.4)
Eos: 2 %
Hematocrit: 43.1 % (ref 37.5–51.0)
Hemoglobin: 14.8 g/dL (ref 13.0–17.7)
Lymphocytes Absolute: 1.7 10*3/uL (ref 0.7–3.1)
Lymphs: 21 %
MCH: 33.5 pg — ABNORMAL HIGH (ref 26.6–33.0)
MCHC: 34.3 g/dL (ref 31.5–35.7)
MCV: 98 fL — ABNORMAL HIGH (ref 79–97)
Monocytes Absolute: 0.8 10*3/uL (ref 0.1–0.9)
Monocytes: 9 %
Neutrophils Absolute: 5.8 10*3/uL (ref 1.4–7.0)
Neutrophils: 68 %
Platelets: 188 10*3/uL (ref 150–450)
RBC: 4.42 x10E6/uL (ref 4.14–5.80)
RDW: 12.5 % (ref 11.6–15.4)
WBC: 8.5 10*3/uL (ref 3.4–10.8)

## 2020-05-06 NOTE — H&P (View-Only) (Signed)
Electrophysiology Office Note   Date:  05/06/2020   ID:  Steven Gray, DOB 06-05-1950, MRN [id number]  PCP:  Geoffry Paradise, MD   Primary Electrophysiologist: Dr Graciela Husbands  CC: afib   History of Present Illness: Steven Gray is a 70 y.o. male who presents today for electrophysiology evaluation.   He has been followed by Dr Graciela Husbands and the AF clinic. He reports having palpitations for years.   He was diagnosed with afib 06/03/19 after presenting with CHF symptoms.  He was found to have afib with RVR, EF 30%, severe LA enlargement and a LAA thrombus.  He was eventually cardioverted after anticoagulation.  He did well with normalization of his EF.  He returned to afib 7/21.  He was again cardioverted.  He has done well since that time. He is very active.  He plays tennis and is an avid runner.  Today, he denies symptoms of palpitations, chest pain, shortness of breath, orthopnea, PND, lower extremity edema, claudication, dizziness, presyncope, syncope, bleeding, or neurologic sequela. The patient is tolerating medications without difficulties and is otherwise without complaint today.    Past Medical History:  Diagnosis Date  . Acute systolic CHF (congestive heart failure) (HCC) 06/03/2019  . Alcohol use 06/03/2019  . Atrial fibrillation (HCC) 06/03/2019  . 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 Heart palpitations 11/30/2012  . Hyperlipidemia    does not take medication for this, denies  . Increased prostate specific antigen (PSA) velocity   . Right knee pain 12/07/2016  . Sleep apnea    wears CPAP   Past Surgical History:  Procedure Laterality Date  . CARDIOVERSION N/A 07/04/2019   Procedure: CARDIOVERSION;  Surgeon: Quintella Reichert, MD;  Location: [facility] ENDOSCOPY;  Service: Cardiovascular;  Laterality: N/A;  . CARDIOVERSION N/A 11/30/2019   Procedure: CARDIOVERSION;  Surgeon: Lewayne Bunting, MD;  Location: [facility] ENDOSCOPY;  Service: Cardiovascular;  Laterality: N/A;  .  CARDIOVERSION N/A 04/02/2020   Procedure: CARDIOVERSION;  Surgeon: Chilton Si, MD;  Location: [facility] ENDOSCOPY;  Service: Cardiovascular;  Laterality: N/A;  . MENISCUS REPAIR    . TEE WITHOUT CARDIOVERSION N/A 06/05/2019   Procedure: TRANSESOPHAGEAL ECHOCARDIOGRAM (TEE);  Surgeon: Wendall Stade, MD;  Location: [facility] ENDOSCOPY;  Service: Cardiovascular;  Laterality: N/A;  . TEE WITHOUT CARDIOVERSION N/A 07/04/2019   Procedure: TRANSESOPHAGEAL ECHOCARDIOGRAM (TEE);  Surgeon: Quintella Reichert, MD;  Location: [facility] ENDOSCOPY;  Service: Cardiovascular;  Laterality: N/A;     Current Outpatient Medications  Medication Sig Dispense Refill  . atorvastatin (LIPITOR) 40 MG tablet TAKE 1 TABLET(40 MG) BY MOUTH DAILY AT 6 PM 30 tablet 11  . ELIQUIS 5 MG TABS tablet TAKE 1 TABLET(5 MG) BY MOUTH TWICE DAILY 60 tablet 5  . losartan (COZAAR) 25 MG tablet TAKE 1 TABLET(25 MG) BY MOUTH AT BEDTIME 30 tablet 11  . metoprolol succinate (TOPROL-XL) 50 MG 24 hr tablet Take 0.5 tablets (25 mg total) by mouth daily. Take with or immediately following a meal. 45 tablet 3   No current facility-administered medications for this visit.    Allergies:   Patient has no known allergies.   Social History:  The patient  reports that he has never smoked. He has never used smokeless tobacco. He reports previous alcohol use. He reports that he does not use drugs.   Family History:  The patient's family history includes CAD in his maternal grandfather; Polycystic kidney disease in his mother; Vascular Disease in his father.    ROS:  Please see the history of present illness.   All other systems are personally reviewed and negative.    PHYSICAL EXAM: VS:  BP 120/78   Pulse (!) 47   Ht 6\' 1"  (1.854 m)   Wt 194 lb 6.4 oz (88.2 kg)   SpO2 96%   BMI 25.65 kg/m  , BMI Body mass index is 25.65 kg/m. GEN: Well nourished, well developed, in no acute distress HEENT: normal Neck: no JVD, carotid bruits, or masses Cardiac: RRR; no  murmurs, rubs, or gallops,no edema  Respiratory:  clear to auscultation bilaterally, normal work of breathing GI: soft, nontender, nondistended, + BS MS: no deformity or atrophy Skin: warm and dry  Neuro:  Strength and sensation are intact Psych: euthymic mood, full affect  EKG:  EKG is ordered today. The ekg ordered today is personally reviewed and shows sinus bradycardia   Recent Labs: 06/03/2019: B Natriuretic Peptide 509.4; TSH 0.737 03/20/2020: BUN 22; Creatinine, Ser 1.40; Hemoglobin 15.1; Platelets 202; Potassium 4.7; Sodium 139  personally reviewed   Lipid Panel     Component Value Date/Time   CHOL 164 06/04/2019 0215   TRIG 94 06/04/2019 0215   HDL 33 (L) 06/04/2019 0215   CHOLHDL 5.0 06/04/2019 0215   VLDL 19 06/04/2019 0215   LDLCALC 112 (H) 06/04/2019 0215   personally reviewed   Wt Readings from Last 3 Encounters:  05/06/20 194 lb 6.4 oz (88.2 kg)  04/23/20 192 lb (87.1 kg)  04/02/20 190 lb 11.2 oz (86.5 kg)      Other studies personally reviewed: Additional studies/ records that were reviewed today include: prior AF clinic notes, Dr 04/04/20 notes, prior echo  Review of the above records today demonstrates: as above   ASSESSMENT AND PLAN:  1.  Persistent atrial fibrillation The patient has symptomatic, recurrent atrial fibrillation. He previously had cardiomyopathy secondary to afib with RVR. Chads2vasc score is 2.  he is anticoagulated with eliquis . Therapeutic strategies for afib including medicine (flecainide, tikosyn, amiodarone) and ablation were discussed in detail with the patient today. Risk, benefits, and alternatives to EP study and radiofrequency ablation for afib were also discussed in detail today. These risks include but are not limited to stroke, bleeding, vascular damage, tamponade, perforation, damage to the esophagus, lungs, and other structures, pulmonary vein stenosis, worsening renal function, and death. The patient understands these  risk and wishes to proceed.  We will therefore proceed with catheter ablation at the next available time.  Carto, ICE, anesthesia are requested for the procedure.  Will also obtain cardiac CT prior to the procedure to exclude LAA thrombus and further evaluate atrial anatomy.  We discussed that with left atrial enlargement, anticipated success with ablation may be low.  I do think based on recent data that when compared to medical therapy, his overall AF burden and progression of disease is likely to be best with ablation.  2. OSA Reports compliance with CPAP  3. NICM Resolved with sinus rhythm As above  Signed, Koren Bound, MD  05/06/2020 11:29 AM     [facility] [address] [place] [phone] (office) [phone] (fax)

## 2020-05-06 NOTE — Progress Notes (Signed)
 Electrophysiology Office Note   Date:  05/06/2020   ID:  Steven Gray, DOB 12/19/1950, MRN [id number]  PCP:  Aronson, Richard, MD   Primary Electrophysiologist: Dr Klein  CC: afib   History of Present Illness: Steven Gray is a 69 y.o. male who presents today for electrophysiology evaluation.   He has been followed by Dr Klein and the AF clinic. He reports having palpitations for years.   He was diagnosed with afib 06/03/19 after presenting with CHF symptoms.  He was found to have afib with RVR, EF 30%, severe LA enlargement and a LAA thrombus.  He was eventually cardioverted after anticoagulation.  He did well with normalization of his EF.  He returned to afib 7/21.  He was again cardioverted.  He has done well since that time. He is very active.  He plays tennis and is an avid runner.  Today, he denies symptoms of palpitations, chest pain, shortness of breath, orthopnea, PND, lower extremity edema, claudication, dizziness, presyncope, syncope, bleeding, or neurologic sequela. The patient is tolerating medications without difficulties and is otherwise without complaint today.    Past Medical History:  Diagnosis Date  . Acute systolic CHF (congestive heart failure) (HCC) 06/03/2019  . Alcohol use 06/03/2019  . Atrial fibrillation (HCC) 06/03/2019  . 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 Heart palpitations 11/30/2012  . Hyperlipidemia    does not take medication for this, denies  . Increased prostate specific antigen (PSA) velocity   . Right knee pain 12/07/2016  . Sleep apnea    wears CPAP   Past Surgical History:  Procedure Laterality Date  . CARDIOVERSION N/A 07/04/2019   Procedure: CARDIOVERSION;  Surgeon: Turner, Traci R, MD;  Location: [facility] ENDOSCOPY;  Service: Cardiovascular;  Laterality: N/A;  . CARDIOVERSION N/A 11/30/2019   Procedure: CARDIOVERSION;  Surgeon: Crenshaw, Brian S, MD;  Location: [facility] ENDOSCOPY;  Service: Cardiovascular;  Laterality: N/A;  .  CARDIOVERSION N/A 04/02/2020   Procedure: CARDIOVERSION;  Surgeon: Browns, Tiffany, MD;  Location: [facility] ENDOSCOPY;  Service: Cardiovascular;  Laterality: N/A;  . MENISCUS REPAIR    . TEE WITHOUT CARDIOVERSION N/A 06/05/2019   Procedure: TRANSESOPHAGEAL ECHOCARDIOGRAM (TEE);  Surgeon: Nishan, Peter C, MD;  Location: [facility] ENDOSCOPY;  Service: Cardiovascular;  Laterality: N/A;  . TEE WITHOUT CARDIOVERSION N/A 07/04/2019   Procedure: TRANSESOPHAGEAL ECHOCARDIOGRAM (TEE);  Surgeon: Turner, Traci R, MD;  Location: [facility] ENDOSCOPY;  Service: Cardiovascular;  Laterality: N/A;     Current Outpatient Medications  Medication Sig Dispense Refill  . atorvastatin (LIPITOR) 40 MG tablet TAKE 1 TABLET(40 MG) BY MOUTH DAILY AT 6 PM 30 tablet 11  . ELIQUIS 5 MG TABS tablet TAKE 1 TABLET(5 MG) BY MOUTH TWICE DAILY 60 tablet 5  . losartan (COZAAR) 25 MG tablet TAKE 1 TABLET(25 MG) BY MOUTH AT BEDTIME 30 tablet 11  . metoprolol succinate (TOPROL-XL) 50 MG 24 hr tablet Take 0.5 tablets (25 mg total) by mouth daily. Take with or immediately following a meal. 45 tablet 3   No current facility-administered medications for this visit.    Allergies:   Patient has no known allergies.   Social History:  The patient  reports that he has never smoked. He has never used smokeless tobacco. He reports previous alcohol use. He reports that he does not use drugs.   Family History:  The patient's family history includes CAD in his maternal grandfather; Polycystic kidney disease in his mother; Vascular Disease in his father.    ROS:    Please see the history of present illness.   All other systems are personally reviewed and negative.    PHYSICAL EXAM: VS:  BP 120/78   Pulse (!) 47   Ht 6\' 1"  (1.854 m)   Wt 194 lb 6.4 oz (88.2 kg)   SpO2 96%   BMI 25.65 kg/m  , BMI Body mass index is 25.65 kg/m. GEN: Well nourished, well developed, in no acute distress HEENT: normal Neck: no JVD, carotid bruits, or masses Cardiac: RRR; no  murmurs, rubs, or gallops,no edema  Respiratory:  clear to auscultation bilaterally, normal work of breathing GI: soft, nontender, nondistended, + BS MS: no deformity or atrophy Skin: warm and dry  Neuro:  Strength and sensation are intact Psych: euthymic mood, full affect  EKG:  EKG is ordered today. The ekg ordered today is personally reviewed and shows sinus bradycardia   Recent Labs: 06/03/2019: B Natriuretic Peptide 509.4; TSH 0.737 03/20/2020: BUN 22; Creatinine, Ser 1.40; Hemoglobin 15.1; Platelets 202; Potassium 4.7; Sodium 139  personally reviewed   Lipid Panel     Component Value Date/Time   CHOL 164 06/04/2019 0215   TRIG 94 06/04/2019 0215   HDL 33 (L) 06/04/2019 0215   CHOLHDL 5.0 06/04/2019 0215   VLDL 19 06/04/2019 0215   LDLCALC 112 (H) 06/04/2019 0215   personally reviewed   Wt Readings from Last 3 Encounters:  05/06/20 194 lb 6.4 oz (88.2 kg)  04/23/20 192 lb (87.1 kg)  04/02/20 190 lb 11.2 oz (86.5 kg)      Other studies personally reviewed: Additional studies/ records that were reviewed today include: prior AF clinic notes, Dr 04/04/20 notes, prior echo  Review of the above records today demonstrates: as above   ASSESSMENT AND PLAN:  1.  Persistent atrial fibrillation The patient has symptomatic, recurrent atrial fibrillation. He previously had cardiomyopathy secondary to afib with RVR. Chads2vasc score is 2.  he is anticoagulated with eliquis . Therapeutic strategies for afib including medicine (flecainide, tikosyn, amiodarone) and ablation were discussed in detail with the patient today. Risk, benefits, and alternatives to EP study and radiofrequency ablation for afib were also discussed in detail today. These risks include but are not limited to stroke, bleeding, vascular damage, tamponade, perforation, damage to the esophagus, lungs, and other structures, pulmonary vein stenosis, worsening renal function, and death. The patient understands these  risk and wishes to proceed.  We will therefore proceed with catheter ablation at the next available time.  Carto, ICE, anesthesia are requested for the procedure.  Will also obtain cardiac CT prior to the procedure to exclude LAA thrombus and further evaluate atrial anatomy.  We discussed that with left atrial enlargement, anticipated success with ablation may be low.  I do think based on recent data that when compared to medical therapy, his overall AF burden and progression of disease is likely to be best with ablation.  2. OSA Reports compliance with CPAP  3. NICM Resolved with sinus rhythm As above  Signed, Koren Bound, MD  05/06/2020 11:29 AM     [facility] [address] [place] [phone] (office) [phone] (fax)

## 2020-05-06 NOTE — Patient Instructions (Addendum)
Medication Instructions:  Your physician recommends that you continue on your current medications as directed. Please refer to the Current Medication list given to you today.  *If you need a refill on your cardiac medications before your next appointment, please call your pharmacy*  Lab Work: CBC, BMP  If you have labs (blood work) drawn today and your tests are completely normal, you will receive your results only by:  MyChart Message (if you have MyChart) OR  A paper copy in the mail If you have any lab test that is abnormal or we need to change your treatment, we will call you to review the results.  Testing/Procedures: Your physician has recommended that you have an ablation. Catheter ablation is a medical procedure used to treat some cardiac arrhythmias (irregular heartbeats). During catheter ablation, a long, thin, flexible tube is put into a blood vessel in your groin (upper thigh), or neck. This tube is called an ablation catheter. It is then guided to your heart through the blood vessel. Radio frequency waves destroy small areas of heart tissue where abnormal heartbeats may cause an arrhythmia to start. Please see the instruction sheet given to you today.   Follow-Up: At [facility], you and your health needs are our priority.  As part of our continuing mission to provide you with exceptional heart care, we have created designated Provider Care Teams.  These Care Teams include your primary Cardiologist (physician) and Advanced Practice Providers (APPs -  Physician Assistants and Nurse Practitioners) who all work together to provide you with the care you need, when you need it.  We recommend signing up for the patient portal called "MyChart".  Sign up information is provided on this After Visit Summary.  MyChart is used to connect with patients for Virtual Visits (Telemedicine).  Patients are able to view lab/test results, encounter notes, upcoming appointments, etc.  Non-urgent  messages can be sent to your provider as well.   To learn more about what you can do with MyChart, go to [email or web address].    Other Instructions:  Cardiac Ablation  Cardiac ablation is a procedure to stop some heart tissue from causing problems. The heart has many electrical connections. Sometimes these connections make the heart beat very fast or irregularly. Removing some problem areas can improve the heart rhythm or make it normal. What happens before the procedure?  Follow instructions from your doctor about what you cannot eat or drink.  Ask your doctor about: ? Changing or stopping your normal medicines. This is important if you take diabetes medicines or blood thinners. ? Taking medicines such as aspirin and ibuprofen. These medicines can thin your blood. Do not take these medicines before your procedure if your doctor tells you not to.  Plan to have someone take you home.  If you will be going home right after the procedure, plan to have someone with you for 24 hours. What happens during the procedure?  To lower your risk of infection: ? Your health care team will wash or sanitize their hands. ? Your skin will be washed with soap. ? Hair may be removed from your neck or groin.  An IV tube will be put into one of your veins.  You will be given a medicine to help you relax (sedative).  Skin on your neck or groin will be numbed.  A cut (incision) will be made in your neck or groin.  A needle will be put through your cut and into a vein in your neck  or groin.  A tube (catheter) will be put into the needle. The tube will be moved to your heart. X-rays (fluoroscopy) will be used to help guide the tube.  Small devices (electrodes) on the tip of the tube will send out electrical currents.  Dye may be put through the tube. This helps your surgeon see your heart.  Electrical energy will be used to scar (ablate) some heart tissue. Your surgeon may use: ? Heat  (radiofrequency energy). ? Laser energy. ? Extreme cold (cryoablation).  The tube will be taken out.  Pressure will be held on your cut. This helps stop bleeding.  A bandage (dressing) will be put on your cut. The procedure may vary. What happens after the procedure?  You will be monitored until your medicines have worn off.  Your cut will be watched for bleeding. You will need to lie still for a few hours.  Do not drive for 24 hours or as long as your doctor tells you. Summary  Cardiac ablation is a procedure to stop some heart tissue from causing problems.  Electrical energy will be used to scar (ablate) some heart tissue. This information is not intended to replace advice given to you by your health care provider. Make sure you discuss any questions you have with your health care provider. Document Revised: 04/02/2017 Document Reviewed: 03/09/2016 Elsevier Patient Education  2020 [employer].

## 2020-05-07 ENCOUNTER — Telehealth (HOSPITAL_COMMUNITY): Payer: Self-pay | Admitting: *Deleted

## 2020-05-07 NOTE — Telephone Encounter (Signed)
Reaching out to patient to offer assistance regarding upcoming cardiac imaging study; pt verbalizes understanding of appt date/time, parking situation and where to check in, pre-test NPO status; name and call back number provided for further questions should they arise  Pearlena Ow RN Navigator Cardiac Imaging [facility] [phone] office [phone] cell  

## 2020-05-08 ENCOUNTER — Encounter (HOSPITAL_COMMUNITY): Payer: Self-pay

## 2020-05-08 ENCOUNTER — Other Ambulatory Visit: Payer: Self-pay

## 2020-05-08 ENCOUNTER — Ambulatory Visit (HOSPITAL_COMMUNITY)
Admission: RE | Admit: 2020-05-08 | Discharge: 2020-05-08 | Disposition: A | Discharge status: Home / Self Care | Payer: Commercial Managed Care - PPO | Source: Ambulatory Visit | Attending: Internal Medicine | Admitting: Internal Medicine

## 2020-05-08 DIAGNOSIS — I4891 Unspecified atrial fibrillation: Secondary | ICD-10-CM | POA: Diagnosis not present

## 2020-05-08 IMAGING — CT CT HEART MORPH/PULM VEIN W/ CM & W/O CA SCORE
3 of 5 series · 14 of 20 positions shown, 15 images · non-contrast
Comparison: None.
COMPARISON: None.

Addendum:
EXAM:
OVER-READ INTERPRETATION  CT CHEST

The following report is an over-read performed by radiologist Dr.
MANSHUK [REDACTED] on [DATE]. This
over-read does not include interpretation of cardiac or coronary
anatomy or pathology. The coronary calcium score/coronary CTA
interpretation by the cardiologist is attached.
CLINICAL DATA: 69M with chronic systolic and diastolic heart
failure, OSA, and atrial fibrillation scheduled for an ablation.
Cardiac CT/CTA
TECHNIQUE: The patient was scanned on a Siemens Somatom scanner.

[Series 3: cascseq · axial · 0.39mm/px · z∈[+1174,+1336]mm · 3 of 82 slices shown, 4 images]
[im 1/82  vessel]
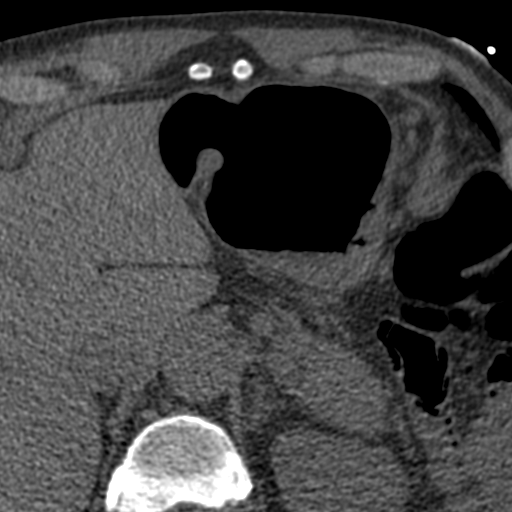
[im 1/82  lung]
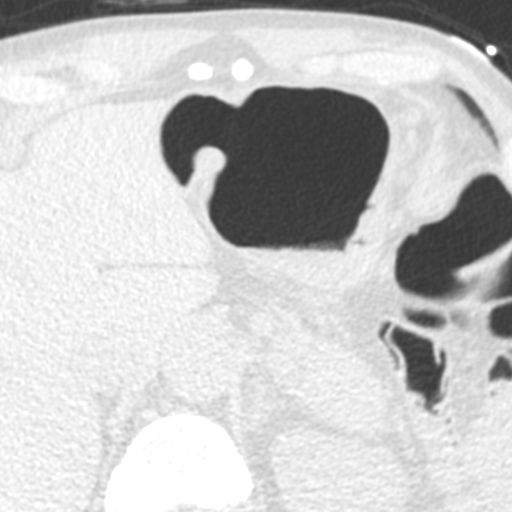
[im 41/82  vessel]
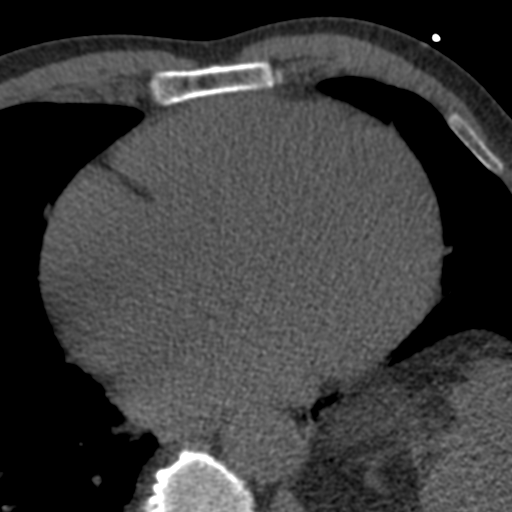
[im 82/82  vessel]
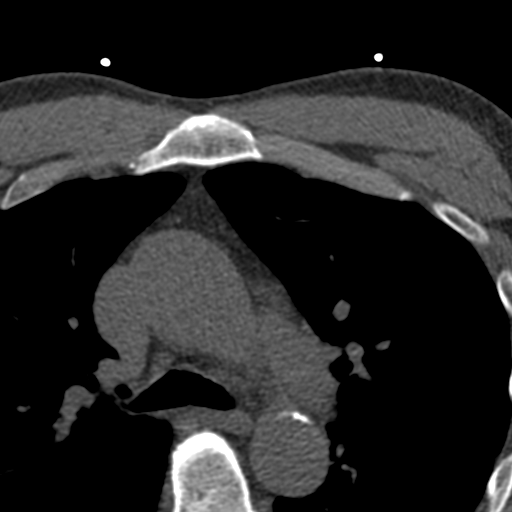

[Series 6: best syst · axial · 0.39mm/px · z∈[+1208,+1307]mm · 7 of 332 slices shown]
[im 42/332  vessel]
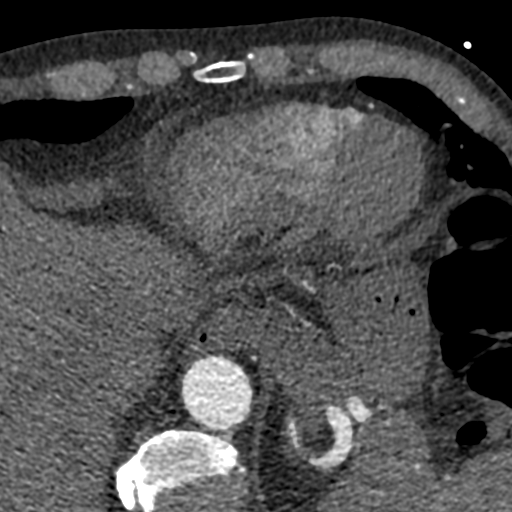
[im 83/332  vessel]
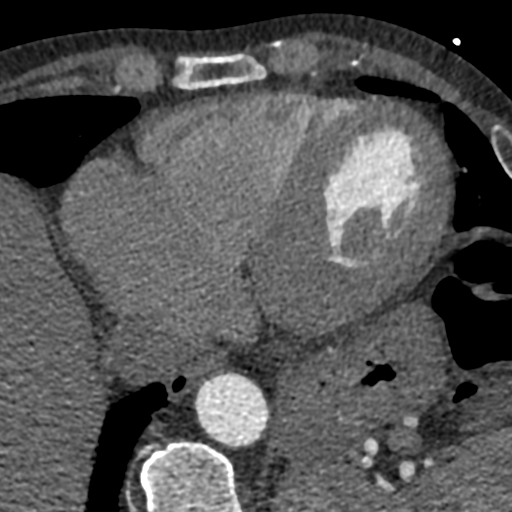
[im 125/332  vessel]
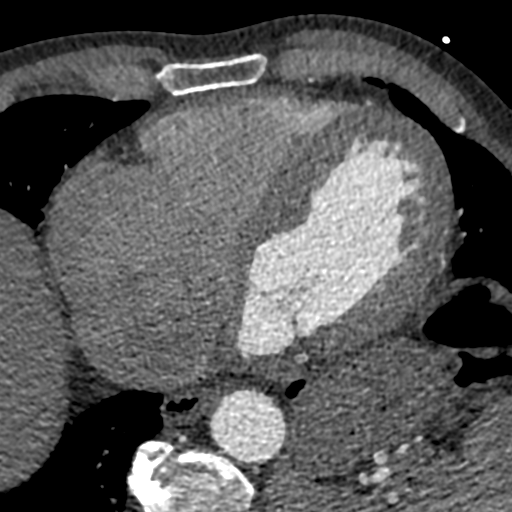
[im 166/332  vessel]
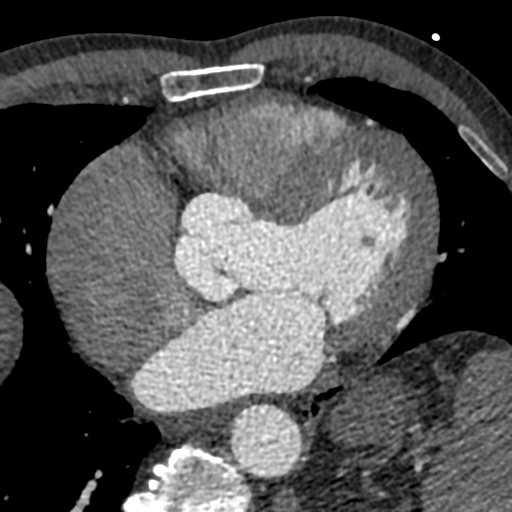
[im 207/332  vessel]
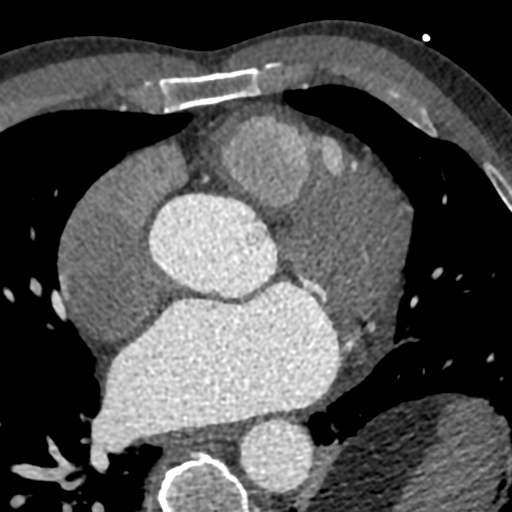
[im 249/332  vessel]
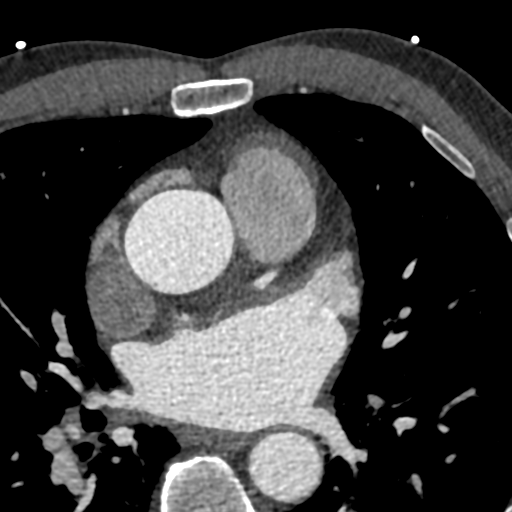
[im 290/332  vessel]
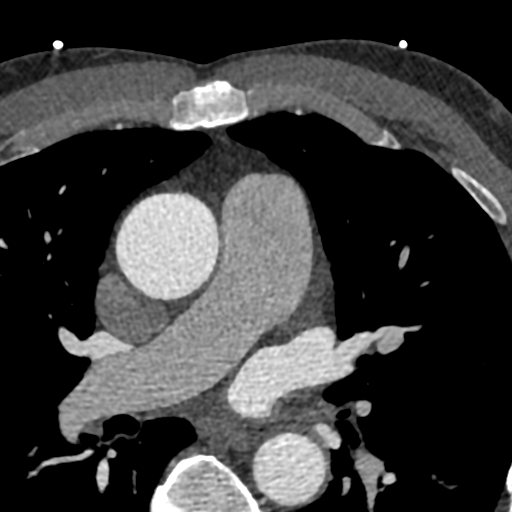

[Series 13: pv delay · axial · portal-venous · 0.39mm/px · z∈[+1246,+1304]mm · 4 of 195 slices shown]
[im 39/195  vessel]
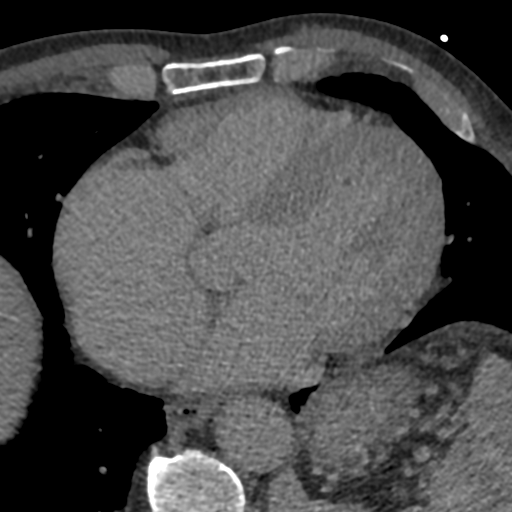
[im 78/195  vessel]
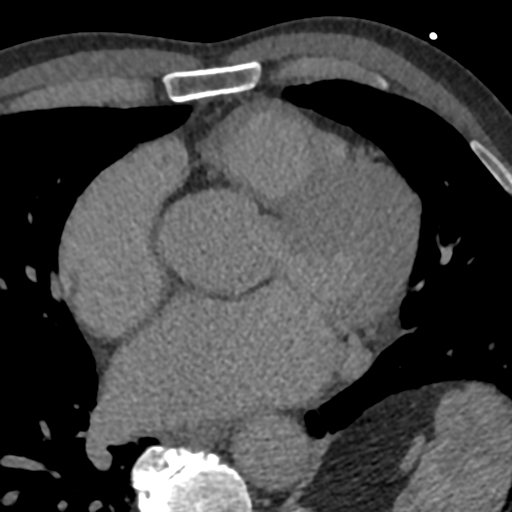
[im 117/195  vessel]
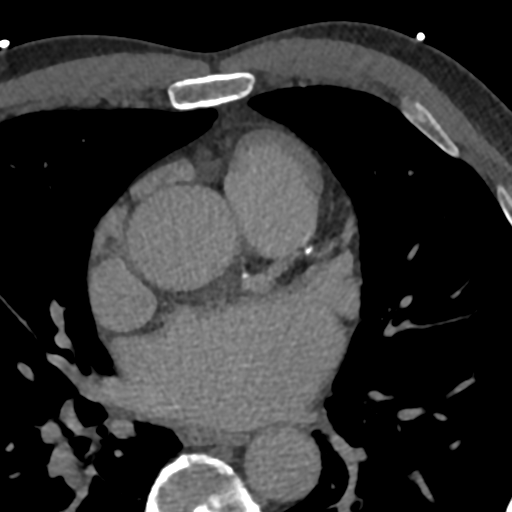
[im 156/195  vessel]
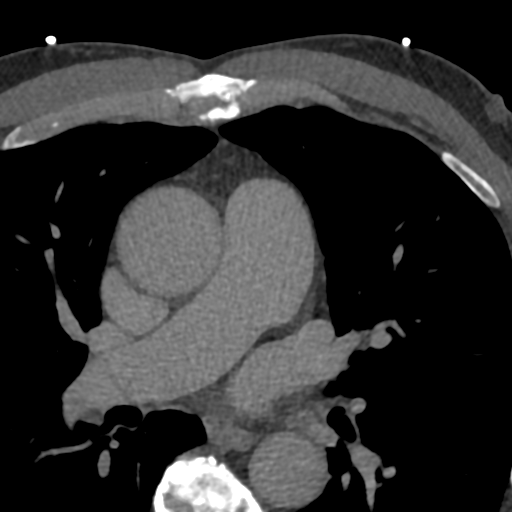

[14 of 20 positions shown; findings below may reference images not displayed]

FINDINGS: Aortic atherosclerosis. Within the visualized portions of the thorax
there are no suspicious appearing pulmonary nodules or masses, there
is no acute consolidative airspace disease, no pleural effusions, no
pneumothorax and no lymphadenopathy. Visualized portions of the
upper abdomen are unremarkable. There are no aggressive appearing
lytic or blastic lesions noted in the visualized portions of the
skeleton.
IMPRESSION: 1.  Aortic Atherosclerosis ([3D]-[3D]).
FINDINGS: A 120 kV prospective scan was triggered in the descending thoracic
aorta at 111 HU's. Gantry rotation speed was 280 msecs and
collimation was .9 mm. No beta blockade and no NTG was given. The 3D
data set was reconstructed in 5% intervals of the 60-80 % of the R-R
cycle. Diastolic phases were analyzed on a dedicated work station
using MPR, MIP and VRT modes. The patient received 80 cc of
contrast.

There is normal pulmonary vein drainage into the left atrium (3 on
the right and 2 on the left) with ostial measurements as follows:

RUPV: 19.5 x 15.1 mm

RMPV: 16.0  X 11.8 mm

RLPV: 11.2 x 6.69 mm

LUPV: 21.7 x 20.9 mm

LLPV: 17.6 x 12.1 mm

The left atrial appendage is large chicken wing type with ostial
size 34 x 21 mm and length 32 mm. There is no thrombus in the left
atrial appendage.

The esophagus runs in the left atrial midline and is not in the
proximity to any of the pulmonary veins.

Aorta: Mild dilation of the ascending aorta (3.9 cm). No dissection
or calcifications.

Aortic Valve:  Trileaflet.  No calcifications.

Coronary Arteries: Normal coronary origin. Left dominance. The study
was performed without use of NTG and insufficient for plaque
evaluation. Calcification noted in the LM, LAD. The RCA is small and
nondominant. Coronary calcium score 719. This score is 82nd
percentile for age and sex-matched controls.
IMPRESSION: 1. There is normal pulmonary vein drainage into the left atrium.
There are three veins on the right and two on the left.

2. The left atrial appendage is large chicken wing type with ostial
size 34 x 21 mm and length 32 mm. There is no thrombus in the left
atrial appendage.

3. The esophagus runs in the left atrial midline and is not in the
proximity to any of the pulmonary veins.

4. Calcification noted in the LM, LAD. The RCA is small and
nondominant. Coronary calcium score 719. This score is 82nd
percentile for age and sex-matched controls.

*** End of Addendum ***
EXAM:
OVER-READ INTERPRETATION  CT CHEST

The following report is an over-read performed by radiologist Dr.
MANSHUK [REDACTED] on [DATE]. This
over-read does not include interpretation of cardiac or coronary
anatomy or pathology. The coronary calcium score/coronary CTA
interpretation by the cardiologist is attached.
FINDINGS: Aortic atherosclerosis. Within the visualized portions of the thorax
there are no suspicious appearing pulmonary nodules or masses, there
is no acute consolidative airspace disease, no pleural effusions, no
pneumothorax and no lymphadenopathy. Visualized portions of the
upper abdomen are unremarkable. There are no aggressive appearing
lytic or blastic lesions noted in the visualized portions of the
skeleton.
IMPRESSION: 1.  Aortic Atherosclerosis ([3D]-[3D]).

## 2020-05-08 MED ORDER — IOHEXOL 350 MG/ML SOLN
80.0000 mL | Freq: Once | INTRAVENOUS | Status: AC | PRN
  Administered 2020-05-08: 80 mL via INTRAVENOUS

## 2020-05-11 ENCOUNTER — Other Ambulatory Visit (HOSPITAL_COMMUNITY)
Admission: RE | Admit: 2020-05-11 | Discharge: 2020-05-11 | Disposition: A | Discharge status: Home / Self Care | Payer: Commercial Managed Care - PPO | Source: Ambulatory Visit | Attending: Internal Medicine | Admitting: Internal Medicine

## 2020-05-11 DIAGNOSIS — Z20822 Contact with and (suspected) exposure to covid-19: Secondary | ICD-10-CM | POA: Diagnosis not present

## 2020-05-11 DIAGNOSIS — Z01812 Encounter for preprocedural laboratory examination: Secondary | ICD-10-CM | POA: Insufficient documentation

## 2020-05-12 LAB — SARS CORONAVIRUS 2 (TAT 6-24 HRS): SARS Coronavirus 2: NEGATIVE

## 2020-05-13 NOTE — Progress Notes (Signed)
Instructed patient on the following items: Arrival time 0830 Nothing to eat or drink after midnight No meds AM of procedure Responsible person to drive you home and stay with you for 24 hrs  Have you missed any doses of anti-coagulant Eliquis- hasn't missed any doses   

## 2020-05-14 ENCOUNTER — Ambulatory Visit (HOSPITAL_COMMUNITY)
Admission: RE | Admit: 2020-05-14 | Discharge: 2020-05-14 | Disposition: A | Discharge status: Home / Self Care | Payer: Commercial Managed Care - PPO | Attending: Internal Medicine | Admitting: Internal Medicine

## 2020-05-14 ENCOUNTER — Ambulatory Visit (HOSPITAL_COMMUNITY): Payer: Commercial Managed Care - PPO | Admitting: Anesthesiology

## 2020-05-14 ENCOUNTER — Encounter (HOSPITAL_COMMUNITY): Payer: Self-pay | Admitting: Internal Medicine

## 2020-05-14 ENCOUNTER — Encounter (HOSPITAL_COMMUNITY)
Admission: RE | Disposition: A | Discharge status: Home / Self Care | Payer: Self-pay | Source: Home / Self Care | Attending: Internal Medicine

## 2020-05-14 DIAGNOSIS — Z79899 Other long term (current) drug therapy: Secondary | ICD-10-CM | POA: Diagnosis not present

## 2020-05-14 DIAGNOSIS — I4819 Other persistent atrial fibrillation: Secondary | ICD-10-CM | POA: Insufficient documentation

## 2020-05-14 DIAGNOSIS — Z7901 Long term (current) use of anticoagulants: Secondary | ICD-10-CM | POA: Insufficient documentation

## 2020-05-14 DIAGNOSIS — G4733 Obstructive sleep apnea (adult) (pediatric): Secondary | ICD-10-CM | POA: Diagnosis not present

## 2020-05-14 HISTORY — PX: ATRIAL FIBRILLATION ABLATION: EP1191

## 2020-05-14 LAB — POCT ACTIVATED CLOTTING TIME
Activated Clotting Time: 267 seconds
Activated Clotting Time: 309 seconds
Activated Clotting Time: 315 seconds

## 2020-05-14 SURGERY — ATRIAL FIBRILLATION ABLATION
Anesthesia: General

## 2020-05-14 MED ORDER — PROTAMINE SULFATE 10 MG/ML IV SOLN
INTRAVENOUS | Status: DC | PRN
  Administered 2020-05-14: 40 mg via INTRAVENOUS

## 2020-05-14 MED ORDER — DEXAMETHASONE SODIUM PHOSPHATE 10 MG/ML IJ SOLN
INTRAMUSCULAR | Status: DC | PRN
  Administered 2020-05-14: 8 mg via INTRAVENOUS

## 2020-05-14 MED ORDER — HEPARIN (PORCINE) IN NACL 1000-0.9 UT/500ML-% IV SOLN
INTRAVENOUS | Status: AC
  Filled 2020-05-14: qty 500

## 2020-05-14 MED ORDER — ONDANSETRON HCL 4 MG/2ML IJ SOLN
INTRAMUSCULAR | Status: DC | PRN
  Administered 2020-05-14: 4 mg via INTRAVENOUS

## 2020-05-14 MED ORDER — SODIUM CHLORIDE 0.9 % IV SOLN: 250.0000 mL | INTRAVENOUS | Status: DC | PRN

## 2020-05-14 MED ORDER — PROPOFOL 10 MG/ML IV BOLUS
INTRAVENOUS | Status: DC | PRN
  Administered 2020-05-14: 30 mg via INTRAVENOUS
  Administered 2020-05-14: 170 mg via INTRAVENOUS

## 2020-05-14 MED ORDER — HEPARIN SODIUM (PORCINE) 1000 UNIT/ML IJ SOLN
INTRAMUSCULAR | Status: DC | PRN
  Administered 2020-05-14: 15000 [IU] via INTRAVENOUS
  Administered 2020-05-14: 1000 [IU] via INTRAVENOUS

## 2020-05-14 MED ORDER — ACETAMINOPHEN 325 MG PO TABS: 650.0000 mg | ORAL_TABLET | ORAL | Status: DC | PRN

## 2020-05-14 MED ORDER — SUGAMMADEX SODIUM 200 MG/2ML IV SOLN
INTRAVENOUS | Status: DC | PRN
  Administered 2020-05-14: 200 mg via INTRAVENOUS

## 2020-05-14 MED ORDER — HYDROCODONE-ACETAMINOPHEN 5-325 MG PO TABS: 1.0000 | ORAL_TABLET | ORAL | Status: DC | PRN

## 2020-05-14 MED ORDER — ROCURONIUM BROMIDE 10 MG/ML (PF) SYRINGE
PREFILLED_SYRINGE | INTRAVENOUS | Status: DC | PRN
  Administered 2020-05-14: 10 mg via INTRAVENOUS
  Administered 2020-05-14: 20 mg via INTRAVENOUS
  Administered 2020-05-14: 70 mg via INTRAVENOUS

## 2020-05-14 MED ORDER — SODIUM CHLORIDE 0.9 % IV SOLN: INTRAVENOUS | Status: DC

## 2020-05-14 MED ORDER — ONDANSETRON HCL 4 MG/2ML IJ SOLN: 4.0000 mg | Freq: Four times a day (QID) | INTRAMUSCULAR | Status: DC | PRN

## 2020-05-14 MED ORDER — PHENYLEPHRINE HCL-NACL 10-0.9 MG/250ML-% IV SOLN
INTRAVENOUS | Status: DC | PRN
  Administered 2020-05-14: 50 ug/min via INTRAVENOUS

## 2020-05-14 MED ORDER — SODIUM CHLORIDE 0.9% FLUSH: 3.0000 mL | Freq: Two times a day (BID) | INTRAVENOUS | Status: DC

## 2020-05-14 MED ORDER — ACETAMINOPHEN 500 MG PO TABS
1000.0000 mg | ORAL_TABLET | Freq: Once | ORAL | Status: AC
  Administered 2020-05-14: 1000 mg via ORAL
  Filled 2020-05-14: qty 2

## 2020-05-14 MED ORDER — SODIUM CHLORIDE 0.9% FLUSH: 3.0000 mL | INTRAVENOUS | Status: DC | PRN

## 2020-05-14 MED ORDER — FENTANYL CITRATE (PF) 100 MCG/2ML IJ SOLN
INTRAMUSCULAR | Status: DC | PRN
  Administered 2020-05-14: 100 ug via INTRAVENOUS

## 2020-05-14 MED ORDER — APIXABAN 5 MG PO TABS
5.0000 mg | ORAL_TABLET | Freq: Once | ORAL | Status: AC
  Administered 2020-05-14: 5 mg via ORAL
  Filled 2020-05-14: qty 1

## 2020-05-14 MED ORDER — HEPARIN SODIUM (PORCINE) 1000 UNIT/ML IJ SOLN
INTRAMUSCULAR | Status: DC | PRN
  Administered 2020-05-14: 5000 [IU] via INTRAVENOUS
  Administered 2020-05-14: 3000 [IU] via INTRAVENOUS

## 2020-05-14 MED ORDER — HEPARIN (PORCINE) IN NACL 1000-0.9 UT/500ML-% IV SOLN
INTRAVENOUS | Status: DC | PRN
  Administered 2020-05-14: 500 mL

## 2020-05-14 MED ORDER — PANTOPRAZOLE SODIUM 40 MG PO TBEC: 40.0000 mg | DELAYED_RELEASE_TABLET | Freq: Every day | ORAL | 0 refills | Status: DC

## 2020-05-14 MED ORDER — PHENYLEPHRINE HCL (PRESSORS) 10 MG/ML IV SOLN
INTRAVENOUS | Status: DC | PRN
  Administered 2020-05-14: 160 ug via INTRAVENOUS
  Administered 2020-05-14: 120 ug via INTRAVENOUS

## 2020-05-14 MED ORDER — MIDAZOLAM HCL 5 MG/5ML IJ SOLN
INTRAMUSCULAR | Status: DC | PRN
  Administered 2020-05-14: 2 mg via INTRAVENOUS

## 2020-05-14 MED ORDER — LIDOCAINE 2% (20 MG/ML) 5 ML SYRINGE
INTRAMUSCULAR | Status: DC | PRN
  Administered 2020-05-14: 80 mg via INTRAVENOUS

## 2020-05-14 MED ORDER — HEPARIN SODIUM (PORCINE) 1000 UNIT/ML IJ SOLN
INTRAMUSCULAR | Status: AC
  Filled 2020-05-14: qty 2

## 2020-05-14 SURGICAL SUPPLY — 20 items
BLANKET WARM UNDERBOD FULL ACC (MISCELLANEOUS) ×3 IMPLANT
CATH 8FR REPROCESSED SOUNDSTAR (CATHETERS) ×3 IMPLANT
CATH 8FR SOUNDSTAR REPROCESSED (CATHETERS) IMPLANT
CATH MAPPNG PENTARAY F 2-6-2MM (CATHETERS) IMPLANT
CATH SMTCH THERMOCOOL SF DF (CATHETERS) ×2 IMPLANT
CATH WEBSTER BI DIR CS D-F CRV (CATHETERS) ×2 IMPLANT
CLOSURE PERCLOSE PROSTYLE (VASCULAR PRODUCTS) ×6 IMPLANT
COVER SWIFTLINK CONNECTOR (BAG) ×3 IMPLANT
NDL BAYLIS TRANSSEPTAL 71CM (NEEDLE) IMPLANT
NEEDLE BAYLIS TRANSSEPTAL 71CM (NEEDLE) ×3 IMPLANT
PACK EP LATEX FREE (CUSTOM PROCEDURE TRAY) ×3
PACK EP LF (CUSTOM PROCEDURE TRAY) ×1 IMPLANT
PAD PRO RADIOLUCENT 2001M-C (PAD) ×3 IMPLANT
PATCH CARTO3 (PAD) ×2 IMPLANT
PENTARAY F 2-6-2MM (CATHETERS) ×3
SHEATH PINNACLE 7F 10CM (SHEATH) ×4 IMPLANT
SHEATH PINNACLE 9F 10CM (SHEATH) ×2 IMPLANT
SHEATH PROBE COVER 6X72 (BAG) ×2 IMPLANT
SHEATH SWARTZ TS SL2 63CM 8.5F (SHEATH) ×2 IMPLANT
TUBING SMART ABLATE COOLFLOW (TUBING) ×2 IMPLANT

## 2020-05-14 NOTE — Progress Notes (Signed)
Assessed patient post ablation.  No complaints. Groin soft without hematoma or ecchymosis at this time.   Reviewed wound care and restrictions.   Pt can go once bed rest is up if no further issues arise.   Casimiro Needle [address]" [place], PA-C  05/14/2020 2:51 PM

## 2020-05-14 NOTE — Anesthesia Procedure Notes (Signed)
Procedure Name: Intubation Date/Time: 05/14/2020 10:39 AM Performed by: Inda Coke, CRNA Pre-anesthesia Checklist: Patient identified, Emergency Drugs available, Suction available and Patient being monitored Patient Re-evaluated:Patient Re-evaluated prior to induction Oxygen Delivery Method: Circle System Utilized Preoxygenation: Pre-oxygenation with 100% oxygen Induction Type: IV induction Ventilation: Mask ventilation without difficulty Laryngoscope Size: Mac and 4 Grade View: Grade I Tube type: Oral Tube size: 7.5 mm Number of attempts: 1 Airway Equipment and Method: Stylet and Oral airway Placement Confirmation: ETT inserted through vocal cords under direct vision,  positive ETCO2 and breath sounds checked- equal and bilateral Secured at: 23 cm Tube secured with: Tape Dental Injury: Teeth and Oropharynx as per pre-operative assessment

## 2020-05-14 NOTE — Transfer of Care (Signed)
Immediate Anesthesia Transfer of Care Note  Patient: Steven Gray  Procedure(s) Performed: ATRIAL FIBRILLATION ABLATION (N/A )  Patient Location: Cath Lab  Anesthesia Type:General  Level of Consciousness: awake, alert  and oriented  Airway & Oxygen Therapy: Patient Spontanous Breathing and Patient connected to nasal cannula oxygen  Post-op Assessment: Report given to RN and Post -op Vital signs reviewed and stable  Post vital signs: Reviewed and stable  Last Vitals:  Vitals Value Taken Time  BP    Temp 36.8 C 05/14/20 1321  Pulse    Resp    SpO2      Last Pain:  Vitals:   05/14/20 1321  TempSrc: Temporal  PainSc: 0-No pain      Patients Stated Pain Goal: 3 (05/14/20 0903)  Complications: No complications documented.

## 2020-05-14 NOTE — Anesthesia Preprocedure Evaluation (Addendum)
Anesthesia Evaluation  Patient identified by MRN, date of birth, ID band Patient awake    Reviewed: Allergy & Precautions, NPO status , Patient's Chart, lab work & pertinent test results, reviewed documented beta blocker date and time   Airway Mallampati: II  TM Distance: >3 FB Neck ROM: Full    Dental  (+) Teeth Intact, Dental Advisory Given   Pulmonary sleep apnea and Continuous Positive Airway Pressure Ventilation ,    Pulmonary exam normal breath sounds clear to auscultation       Cardiovascular hypertension, Pt. on medications and Pt. on home beta blockers +CHF  + dysrhythmias Atrial Fibrillation  Rhythm:Irregular Rate:Abnormal  Echo 08/28/19: 1. Global longitudinal strain is -18.4%   COmpared to echo report in Jan 2021, LVEF is improved . Left  ventricular ejection fraction, by estimation, is 60 to 65%. The left  ventricle has normal function. The left ventricle has no regional wall  motion abnormalities. There is mild left  ventricular hypertrophy. Left ventricular diastolic parameters are  consistent with Grade I diastolic dysfunction (impaired relaxation).  2. Right ventricular systolic function is normal. The right ventricular  size is mildly enlarged. There is mildly elevated pulmonary artery  systolic pressure.  3. Left atrial size was severely dilated.  4. Right atrial size was severely dilated.  5. The mitral valve is normal in structure. Trivial mitral valve  regurgitation.  6. The aortic valve is normal in structure. Aortic valve regurgitation is  not visualized.  7. The inferior vena cava is dilated in size with >50% respiratory  variability, suggesting right atrial pressure of 8 mmHg.    Neuro/Psych negative neurological ROS     GI/Hepatic negative GI ROS, (+)     substance abuse  alcohol use,   Endo/Other  negative endocrine ROS  Renal/GU negative Renal ROS     Musculoskeletal negative  musculoskeletal ROS (+)   Abdominal   Peds  Hematology  (+) Blood dyscrasia (Eliquis), ,   Anesthesia Other Findings Day of surgery medications reviewed with the patient.  Reproductive/Obstetrics                            Anesthesia Physical Anesthesia Plan  ASA: III  Anesthesia Plan: General   Post-op Pain Management:    Induction:   PONV Risk Score and Plan: 2  Airway Management Planned: Oral ETT  Additional Equipment:   Intra-op Plan:   Post-operative Plan: Extubation in OR  Informed Consent: I have reviewed the patients History and Physical, chart, labs and discussed the procedure including the risks, benefits and alternatives for the proposed anesthesia with the patient or authorized representative who has indicated his/her understanding and acceptance.     Dental advisory given  Plan Discussed with: CRNA, Anesthesiologist and Surgeon  Anesthesia Plan Comments:         Anesthesia Quick Evaluation

## 2020-05-14 NOTE — Interval H&P Note (Signed)
History and Physical Interval Note:  05/14/2020 10:10 AM  Steven Gray  has presented today for surgery, with the diagnosis of a fib.  The various methods of treatment have been discussed with the patient and family. After consideration of risks, benefits and other options for treatment, the patient has consented to  Procedure(s): ATRIAL FIBRILLATION ABLATION (N/A) as a surgical intervention.  The patient's history has been reviewed, patient examined, no change in status, stable for surgery.  I have reviewed the patient's chart and labs.  Questions were answered to the patient's satisfaction.    Cardiac CT reviewed with him at length.  Calcium score forwarded to PCP and primary cardiologist.  No ischemic symptoms  He reports compliance with eliquis without interruption.   Risk, benefits, and alternatives to EP study and radiofrequency ablation for afib were also discussed in detail today. These risks include but are not limited to stroke, bleeding, vascular damage, tamponade, perforation, damage to the esophagus, lungs, and other structures, pulmonary vein stenosis, worsening renal function, and death. The patient understands these risk and wishes to proceed.     Hillis Range

## 2020-05-14 NOTE — Discharge Instructions (Signed)

## 2020-05-14 NOTE — Progress Notes (Signed)
Dr Johney Frame notified that client states breaks eliquis in half and takes 4 times a day instead of one pill twice a day; per Dr Johney Frame client and his wife notified to take one pill twice a day and client and his wife voiced understanding; client up and walked and tolerated well; right groin stable no bleeding or hematoma

## 2020-05-16 ENCOUNTER — Encounter (HOSPITAL_COMMUNITY): Payer: Self-pay | Admitting: Internal Medicine

## 2020-05-16 NOTE — Anesthesia Postprocedure Evaluation (Signed)
Anesthesia Post Note  Patient: Steven Gray  Procedure(s) Performed: ATRIAL FIBRILLATION ABLATION (N/A )     Patient location during evaluation: Cath Lab Anesthesia Type: General Level of consciousness: awake and alert Pain management: pain level controlled Vital Signs Assessment: post-procedure vital signs reviewed and stable Respiratory status: spontaneous breathing, nonlabored ventilation, respiratory function stable and patient connected to nasal cannula oxygen Cardiovascular status: blood pressure returned to baseline and stable Postop Assessment: no apparent nausea or vomiting Anesthetic complications: no   No complications documented.  Last Vitals:  Vitals:   05/14/20 1559 05/14/20 1630  BP: 114/76 101/71  Pulse: 73 74  Resp: 16 16  Temp:    SpO2: 98% 96%    Last Pain:  Vitals:   05/15/20 1730  TempSrc:   PainSc: 0-No pain                 HODIERNE,ADAM S

## 2020-05-23 ENCOUNTER — Other Ambulatory Visit: Payer: Self-pay

## 2020-05-23 DIAGNOSIS — R931 Abnormal findings on diagnostic imaging of heart and coronary circulation: Secondary | ICD-10-CM

## 2020-05-23 DIAGNOSIS — I2510 Atherosclerotic heart disease of native coronary artery without angina pectoris: Secondary | ICD-10-CM

## 2020-05-27 ENCOUNTER — Other Ambulatory Visit: Payer: Self-pay | Admitting: Internal Medicine

## 2020-05-29 ENCOUNTER — Telehealth (HOSPITAL_COMMUNITY): Payer: Self-pay

## 2020-05-29 NOTE — Telephone Encounter (Signed)
Spoke with the patient, detailed instructions given. He stated that he understood and would be here for his test. Asked to call back with any questions. S.Maurisa Tesmer EMTP 

## 2020-06-04 ENCOUNTER — Ambulatory Visit (HOSPITAL_COMMUNITY): Payer: Commercial Managed Care - PPO | Attending: Cardiology

## 2020-06-04 ENCOUNTER — Other Ambulatory Visit: Payer: Self-pay

## 2020-06-04 DIAGNOSIS — I2510 Atherosclerotic heart disease of native coronary artery without angina pectoris: Secondary | ICD-10-CM | POA: Diagnosis present

## 2020-06-04 DIAGNOSIS — R931 Abnormal findings on diagnostic imaging of heart and coronary circulation: Secondary | ICD-10-CM | POA: Insufficient documentation

## 2020-06-04 LAB — MYOCARDIAL PERFUSION IMAGING
LV dias vol: 147 mL (ref 62–150)
LV sys vol: 69 mL
Peak HR: 55 {beats}/min
Rest HR: 47 {beats}/min
SDS: 1
SRS: 0
SSS: 1
TID: 1.14

## 2020-06-04 MED ORDER — REGADENOSON 0.4 MG/5ML IV SOLN
0.4000 mg | Freq: Once | INTRAVENOUS | Status: AC
  Administered 2020-06-04: 0.4 mg via INTRAVENOUS

## 2020-06-04 MED ORDER — TECHNETIUM TC 99M TETROFOSMIN IV KIT
31.3000 | PACK | Freq: Once | INTRAVENOUS | Status: AC | PRN
  Administered 2020-06-04: 31.3 via INTRAVENOUS
  Filled 2020-06-04: qty 32

## 2020-06-04 MED ORDER — TECHNETIUM TC 99M TETROFOSMIN IV KIT
10.9000 | PACK | Freq: Once | INTRAVENOUS | Status: AC | PRN
  Administered 2020-06-04: 10.9 via INTRAVENOUS
  Filled 2020-06-04: qty 11

## 2020-06-10 NOTE — Progress Notes (Signed)
Primary Care Physician: Geoffry Paradise, MD Primary Electrophysiologist: Dr Graciela Husbands Referring Physician: Francis Dowse   Steven Gray is a 70 y.o. male with a history of HLD, OSA, persistent atrial fibrillation, systolic CHF  who presents for follow up in the [facility].  The patient was initially diagnosed with atrial fibrillation 06/03/19 after presenting to the ER with a 57-month onset of worsening dyspnea, orthopnea and PND.  Patient was previously seen by Dr. Excell Seltzer in 2014 for palpitation.  ETT at the time was low risk.  Heart monitor showed sinus rhythm with PAC and PVC, otherwise no significant arrhythmia. On arrival to the ED at [facility] on 06/03/2019, he was noted to be in new atrial fibrillation with RVR with heart rate of 110-120s. Echo showed reduced EF 30-35% with a severely dilated LA. Patient was started on Eliquis for a CHADS2VASC score of 2. Plan was for patient to undergo TEE/DCCV but TEE showed dense smoke and LA thrombus and so DCCV was not attempted. He was discharged on Toprol and digoxin for rate control. Patient underwent TEE guided DCCV on 07/04/19 which was successful. Repeat echo and cardiac MRI showed normalized EF with severe biatrial enlargement. Patient had done well until the week of 11/20/19 when his smart watch again showed afib. He reports good rate control and has been asymptomatic. Patient is s/p DCCV on 11/30/19.   On follow up today, patient is s/p afib ablation with Dr Johney Frame on 05/14/20. He reports that he has done very well since the procedure. He did have one brief episode of palpitations which resolved within minutes. He denies CP, swallowing pain, or groin issues.   Today, he denies symptoms of chest pain, orthopnea, PND, lower extremity edema, dizziness, presyncope, syncope, bleeding, or neurologic sequela. The patient is tolerating medications without difficulties and is otherwise without complaint today.    Atrial  Fibrillation Risk Factors:  he does have symptoms or diagnosis of sleep apnea. he is compliant with CPAP therapy. he does not have a history of rheumatic fever. he does have a history of alcohol use. The patient does not have a history of early familial atrial fibrillation or other arrhythmias.  he has a BMI of Body mass index is 24.99 kg/m.Marland Kitchen Filed Weights   06/11/20 0907  Weight: 85.9 kg    Family History  Problem Relation Age of Onset  . Polycystic kidney disease Mother   . Vascular Disease Father        vascular dementia  . CAD Maternal Grandfather   . Atrial fibrillation Neg Hx      Atrial Fibrillation Management history:  Previous antiarrhythmic drugs: none Previous cardioversions: 07/04/19, 11/30/19 Previous ablations: 05/14/20 CHADS2VASC score: 2 Anticoagulation history: Eliquis   Past Medical History:  Diagnosis Date  . Alcohol use 06/03/2019   he says not heavy  . 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 Hyperlipidemia    does not take medication for this, denies  . Increased prostate specific antigen (PSA) velocity   . Left atrial enlargement   . 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  . Persistent atrial fibrillation (HCC) 06/03/2019  . Right knee pain 12/07/2016  . Sleep apnea    wears CPAP   Past Surgical History:  Procedure Laterality Date  . ATRIAL FIBRILLATION ABLATION N/A 05/14/2020   Procedure: ATRIAL FIBRILLATION ABLATION;  Surgeon: Hillis Range, MD;  [facility] INVAS[facility] CV LAB;  Service: Cardiovascular;  Laterality: N/A;  .  CARDIOVERSION N/A 07/04/2019   Procedure: CARDIOVERSION;  Surgeon: Quintella Reichert, MD;  Location: [facility] ENDOSCOPY;  Service: Cardiovascular;  Laterality: N/A;  . CARDIOVERSION N/A 11/30/2019   Procedure: CARDIOVERSION;  Surgeon: Lewayne Bunting, MD;  Location: [facility] ENDOSCOPY;  Service: Cardiovascular;  Laterality: N/A;  . CARDIOVERSION N/A 04/02/2020   Procedure: CARDIOVERSION;  Surgeon:  Chilton Si, MD;  Location: [facility] ENDOSCOPY;  Service: Cardiovascular;  Laterality: N/A;  . MENISCUS REPAIR    . TEE WITHOUT CARDIOVERSION N/A 06/05/2019   Procedure: TRANSESOPHAGEAL ECHOCARDIOGRAM (TEE);  Surgeon: Wendall Stade, MD;  Location: [facility] ENDOSCOPY;  Service: Cardiovascular;  Laterality: N/A;  . TEE WITHOUT CARDIOVERSION N/A 07/04/2019   Procedure: TRANSESOPHAGEAL ECHOCARDIOGRAM (TEE);  Surgeon: Quintella Reichert, MD;  Location: [facility] ENDOSCOPY;  Service: Cardiovascular;  Laterality: N/A;    Current Outpatient Medications  Medication Sig Dispense Refill  . atorvastatin (LIPITOR) 40 MG tablet TAKE 1 TABLET(40 MG) BY MOUTH DAILY AT 6 PM (Patient taking differently: Take 40 mg by mouth daily.) 30 tablet 11  . ELIQUIS 5 MG TABS tablet TAKE 1 TABLET(5 MG) BY MOUTH TWICE DAILY (Patient taking differently: Take 5 mg by mouth 2 (two) times daily.) 60 tablet 5  . losartan (COZAAR) 25 MG tablet TAKE 1 TABLET(25 MG) BY MOUTH AT BEDTIME (Patient taking differently: Take 25 mg by mouth daily.) 30 tablet 11  . zolpidem (AMBIEN) 10 MG tablet Take 10 mg by mouth at bedtime as needed for sleep.    . pantoprazole (PROTONIX) 40 MG tablet Take 1 tablet (40 mg total) by mouth daily. 45 tablet 0   No current facility-administered medications for this encounter.    No Known Allergies  Social History   Socioeconomic History  . Marital status: Married    Spouse name: Not on file  . Number of children: Not on file  . Years of education: Not on file  . Highest education level: Not on file  Occupational History  . Occupation: warehousing  Tobacco Use  . Smoking status: Never Smoker  . Smokeless tobacco: Never Used  Substance and Sexual Activity  . Alcohol use: Not Currently    Comment: he reports a couple glasses of wine  . Drug use: No  . Sexual activity: Not on file  Other Topics Concern  . Not on file  Social History Narrative   Married with 2 sons, nonsmoker, social ETOH, works as Psychologist, educational in  Geologist, engineering. Frequent exercise.   Social Determinants of Health   Financial Resource Strain: Not on file  Food Insecurity: Not on file  Transportation Needs: Not on file  Physical Activity: Not on file  Stress: Not on file  Social Connections: Not on file  Intimate Partner Violence: Not on file     ROS- All systems are reviewed and negative except as per the HPI above.  Physical Exam: Vitals:   06/11/20 0907  BP: 122/76  Pulse: 66  Weight: 85.9 kg  Height: 6\' 1"  (1.854 m)    GEN- The patient is well appearing, alert and oriented x 3 today.   HEENT-head normocephalic, atraumatic, sclera clear, conjunctiva pink, hearing intact, trachea midline. Lungs- Clear to ausculation bilaterally, normal work of breathing Heart- Regular rate and rhythm, no murmurs, rubs or gallops  GI- soft, NT, ND, + BS Extremities- no clubbing, cyanosis, or edema MS- no significant deformity or atrophy Skin- no rash or lesion Psych- euthymic mood, full affect Neuro- strength and sensation are intact   Wt Readings from Last 3 Encounters:  06/11/20 85.9 kg  06/04/20 85.7 kg  05/14/20 85.7 kg    EKG today demonstrates  SR Vent. rate 66 BPM PR interval 156 ms QRS duration 90 ms QT/QTc 460/482 ms  Echo 08/28/19 demonstrated  1. Global longitudinal strain is -18.4%   COmpared to echo report in Jan 2021, LVEF is improved . Left  ventricular ejection fraction, by estimation, is 60 to 65%. The left  ventricle has normal function. The left ventricle has no regional wall  motion abnormalities. There is mild left  ventricular hypertrophy. Left ventricular diastolic parameters are  consistent with Grade I diastolic dysfunction (impaired relaxation).  2. Right ventricular systolic function is normal. The right ventricular  size is mildly enlarged. There is mildly elevated pulmonary artery  systolic pressure.  3. Left atrial size was severely dilated.  4. Right atrial size was severely  dilated.  5. The mitral valve is normal in structure. Trivial mitral valve  regurgitation.  6. The aortic valve is normal in structure. Aortic valve regurgitation is  not visualized.  7. The inferior vena cava is dilated in size with >50% respiratory  variability, suggesting right atrial pressure of 8 mmHg.   Epic records are reviewed at length today  CHA2DS2-VASc Score = 2  The patient's score is based upon: CHF History: Yes HTN History: No Diabetes History: No Stroke History: No Vascular Disease History: No      ASSESSMENT AND PLAN: 1. Persistent Atrial Fibrillation (ICD10:  I48.19) The patient's CHA2DS2-VASc score is 2, indicating a 2.2% annual risk of stroke.   S/p afib ablation 05/14/20 Patient appears to be maintaining SR. Continue Eliquis 5 mg BID with no missed doses for 3 months post ablation. His CV score is 2 with h/o cardiomyopathy, however his EF has normalized after restoring SR. Defer long term anticoagulation to primary EP. Continue Toprol 25 mg daily  2. Secondary Hypercoagulable State (ICD10:  D68.69) The patient is at significant risk for stroke/thromboembolism based upon his CHA2DS2-VASc Score of 2.  Continue Apixaban (Eliquis).     3. Obstructive sleep apnea Patient reports compliance with CPAP therapy.  4. Systolic dysfunction Suspect tachycardia mediated.  Resolved on cardiac MRI and echo after restoration of SR.   Follow up with Dr Graciela Husbands and Dr Johney Frame as scheduled.    Jorja Loa PA-C [facility] [address] [place], [place] [address] [phone] 06/11/2020 9:34 AM

## 2020-06-11 ENCOUNTER — Ambulatory Visit (HOSPITAL_COMMUNITY)
Admission: RE | Admit: 2020-06-11 | Discharge: 2020-06-11 | Disposition: A | Discharge status: Home / Self Care | Payer: Commercial Managed Care - PPO | Source: Ambulatory Visit | Attending: Physician Assistant | Admitting: Physician Assistant

## 2020-06-11 ENCOUNTER — Other Ambulatory Visit: Payer: Self-pay

## 2020-06-11 VITALS — BP 122/76 | HR 66 | Ht 73.0 in | Wt 189.4 lb

## 2020-06-11 DIAGNOSIS — I5022 Chronic systolic (congestive) heart failure: Secondary | ICD-10-CM | POA: Diagnosis not present

## 2020-06-11 DIAGNOSIS — I517 Cardiomegaly: Secondary | ICD-10-CM | POA: Diagnosis not present

## 2020-06-11 DIAGNOSIS — D6869 Other thrombophilia: Secondary | ICD-10-CM | POA: Diagnosis not present

## 2020-06-11 DIAGNOSIS — I4819 Other persistent atrial fibrillation: Secondary | ICD-10-CM | POA: Insufficient documentation

## 2020-06-11 DIAGNOSIS — Z7901 Long term (current) use of anticoagulants: Secondary | ICD-10-CM | POA: Diagnosis not present

## 2020-06-11 DIAGNOSIS — Z9989 Dependence on other enabling machines and devices: Secondary | ICD-10-CM | POA: Insufficient documentation

## 2020-06-11 DIAGNOSIS — E785 Hyperlipidemia, unspecified: Secondary | ICD-10-CM | POA: Diagnosis not present

## 2020-06-11 DIAGNOSIS — Z79899 Other long term (current) drug therapy: Secondary | ICD-10-CM | POA: Diagnosis not present

## 2020-06-11 DIAGNOSIS — G4733 Obstructive sleep apnea (adult) (pediatric): Secondary | ICD-10-CM | POA: Diagnosis not present

## 2020-07-08 ENCOUNTER — Other Ambulatory Visit: Payer: Self-pay | Admitting: Internal Medicine

## 2020-07-08 NOTE — Telephone Encounter (Signed)
Prescription refill request for Eliquis received. Indication:afib Last office visit: [place], 05/06/2020, [place] 06/11/2020 Scr: 1.02,  05/06/2020 Age: 70 yo  Weight: 85.9 kg   Pt is on the correct dose of Eliquis per dosing criteria, prescription refill sent for Eliquis 5mg  BID.

## 2020-07-22 ENCOUNTER — Other Ambulatory Visit: Payer: Self-pay

## 2020-07-22 ENCOUNTER — Encounter: Payer: Self-pay | Admitting: Internal Medicine

## 2020-07-22 ENCOUNTER — Ambulatory Visit: Payer: Commercial Managed Care - PPO | Admitting: Internal Medicine

## 2020-07-22 VITALS — BP 100/70 | HR 54 | Ht 73.0 in | Wt 191.0 lb

## 2020-07-22 DIAGNOSIS — I4819 Other persistent atrial fibrillation: Secondary | ICD-10-CM

## 2020-07-22 NOTE — Patient Instructions (Signed)

## 2020-07-22 NOTE — Progress Notes (Unsigned)
Patient Care Team: Geoffry Paradise, MD as PCP - General (Internal Medicine)   HPI  Steven Gray is a 70 y.o. male seen following presentation with congestive heart failure found to be in atrial fibrillation with a rapid rate and with a cardiomyopathy with depressed LV function.  Initial efforts at TEE cardioversion were complicated by the presence of left atrial clot.  He was also found to have severe left atrial enlargement.  4 weeks of anticoagulation resulted in resolution of the clot and he underwent cardioversion and feels much much better.    Taking beta-blockers and losartan.  Interval atrial fibrillation 7/21 and again 11/21   Underwent catheter ablation for atrial fibrillation 1/22 (JA) preprocedural CT scan demonstrated coronary calcification.  Myoview scanning was negative.  Started on statins  Episode of persistent heart rates in the 120s.  Now has an AliveCor but not at that point.   Tolerating Apixoban, no b have car will travel without leeding         DATE TEST EF   1/21 Echo  30-35 % LAE-severe (10ml/m2)  *3/21 TEE 30-35 %   4/21 Echo  60-65%   6/21 cMRI 62% LAE mod-RAE mild LGE neg  1/22 CTA   calcification noted in the LAD left main and a calcium score of 719  2/22 Myoview  No ischemia   Date Cr K Hgb  2/21 1.36 4.4 15.8  7/21  1.19 4.0 14.9        Thromboembolic risk factors ( age -62 , CHF-0.5) for a CHADSVASc Score of 1.5   Records and Results Reviewed   Past Medical History:  Diagnosis Date  . Alcohol use 06/03/2019   he says not heavy  . 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 Hyperlipidemia    does not take medication for this, denies  . Increased prostate specific antigen (PSA) velocity   . Left atrial enlargement   . 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  . Persistent atrial fibrillation (HCC) 06/03/2019  . Right knee pain 12/07/2016  . Sleep apnea    wears CPAP    Past  Surgical History:  Procedure Laterality Date  . ATRIAL FIBRILLATION ABLATION N/A 05/14/2020   Procedure: ATRIAL FIBRILLATION ABLATION;  Surgeon: Hillis Range, MD;  Location: [facility] INVASIVE CV LAB;  Service: Cardiovascular;  Laterality: N/A;  . CARDIOVERSION N/A 07/04/2019   Procedure: CARDIOVERSION;  Surgeon: Quintella Reichert, MD;  Location: [facility] ENDOSCOPY;  Service: Cardiovascular;  Laterality: N/A;  . CARDIOVERSION N/A 11/30/2019   Procedure: CARDIOVERSION;  Surgeon: Lewayne Bunting, MD;  Location[facility] ENDOSCOPY;  Service: Cardiovascular;  Laterality: N/A;  . CARDIOVERSION N/A 04/02/2020   Procedure: CARDIOVERSION;  Surgeon: Chilton Si, MD;  Location: [facility] ENDOSCOPY;  Service: Cardiovascular;  Laterality: N/A;  . MENISCUS REPAIR    . TEE WITHOUT CARDIOVERSION N/A 06/05/2019   Procedure: TRANSESOPHAGEAL ECHOCARDIOGRAM (TEE);  Surgeon: Wendall Stade, MD;  Location: [facility] ENDOSCOPY;  Service: Cardiovascular;  Laterality: N/A;  . TEE WITHOUT CARDIOVERSION N/A 07/04/2019   Procedure: TRANSESOPHAGEAL ECHOCARDIOGRAM (TEE);  Surgeon: Quintella Reichert, MD;  Location: [facility] ENDOSCOPY;  Service: Cardiovascular;  Laterality: N/A;    Current Meds  Medication Sig  . atorvastatin (LIPITOR) 40 MG tablet TAKE 1 TABLET(40 MG) BY MOUTH DAILY AT 6 PM  . ELIQUIS 5 MG TABS tablet TAKE 1 TABLET(5 MG) BY MOUTH TWICE DAILY  . losartan (COZAAR) 25 MG tablet TAKE 1 TABLET(25 MG) BY MOUTH AT  BEDTIME  . zolpidem (AMBIEN) 10 MG tablet Take 10 mg by mouth at bedtime as needed for sleep.  Is which is  No Known Allergies    Review of Systems negative except from HPI and PMH  Physical Exam BP 100/70 (BP Location: Left Arm, Patient Position: Sitting, Cuff Size: Normal)   Pulse (!) 54   Ht 6\' 1"  (1.854 m)   Wt 191 lb (86.6 kg)   BMI 25.20 kg/m  .Well developed and nourished in no acute distress HENT normal Neck supple with JVP-  Flat  Clear Regular rate and rhythm, no murmurs or gallops Abd-soft with active BS No  Clubbing cyanosis edema Skin-warm and dry A & Oriented  Grossly normal sensory and motor function  ECG sinus 54 15/09/46    CrCl cannot be calculated (Patient's most recent lab result is older than the maximum 21 days allowed.).   Assessment and  Plan Atrial fibrillation-persistent  Cardiomyopathy-presumed nonischemic question rate related  Left atrial enlargement-moderate<<-severe  Coronary artery calcification-calcium score 719 with negative Myoview  Alcohol use-    Status post ablation.  Some episodic tachycardia.  May be a reentry tachycardia.  Have advised him to use his AliveCor monitor to document.  He is to see Dr. 17/09/46 next month.  Discussion regarding discontinuation of anticoagulation.  No problems on the apixaban.  With his left ventricular enlargement improvement it might be reasonable to think about stopping with or without an implantable loop recorder to look for evidence of recurrence.  Will defer that decision to the 2 of them.  Continue statins.  Tolerating.

## 2020-08-07 ENCOUNTER — Other Ambulatory Visit: Payer: Self-pay | Admitting: Internal Medicine

## 2020-08-12 ENCOUNTER — Other Ambulatory Visit: Payer: Self-pay

## 2020-08-12 ENCOUNTER — Encounter: Payer: Self-pay | Admitting: Internal Medicine

## 2020-08-12 ENCOUNTER — Ambulatory Visit: Payer: Commercial Managed Care - PPO | Admitting: Internal Medicine

## 2020-08-12 VITALS — BP 122/70 | HR 68 | Ht 73.0 in | Wt 191.6 lb

## 2020-08-12 DIAGNOSIS — I428 Other cardiomyopathies: Secondary | ICD-10-CM

## 2020-08-12 DIAGNOSIS — G4733 Obstructive sleep apnea (adult) (pediatric): Secondary | ICD-10-CM

## 2020-08-12 DIAGNOSIS — I4819 Other persistent atrial fibrillation: Secondary | ICD-10-CM | POA: Diagnosis not present

## 2020-08-12 NOTE — Patient Instructions (Addendum)
Medication Instructions:  Your physician recommends that you continue on your current medications as directed. Please refer to the Current Medication list given to you today.  Labwork: None ordered.  Testing/Procedures: None ordered.  Follow-Up: Your physician wants you to follow-up in: 10/30/20 at 8 am with Hillis Range, MD   Any Other Special Instructions Will Be Listed Below (If Applicable).  If you need a refill on your cardiac medications before your next appointment, please call your pharmacy.

## 2020-08-12 NOTE — Progress Notes (Signed)
PCP: Geoffry Paradise, MD Primary EP: Dr Catha Nottingham is a 70 y.o. male who presents today for routine electrophysiology followup.  Since his recent afib ablation, the patient reports doing very well.  he denies procedure related complications and is pleased with the results of the procedure.  Today, he denies symptoms of palpitations, chest pain, shortness of breath,  lower extremity edema, dizziness, presyncope, or syncope.  The patient is otherwise without complaint today.   Past Medical History:  Diagnosis Date  . Alcohol use 06/03/2019   he says not heavy  . 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 Hyperlipidemia    does not take medication for this, denies  . Increased prostate specific antigen (PSA) velocity   . Left atrial enlargement   . 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  . Persistent atrial fibrillation (HCC) 06/03/2019  . Right knee pain 12/07/2016  . Sleep apnea    wears CPAP   Past Surgical History:  Procedure Laterality Date  . ATRIAL FIBRILLATION ABLATION N/A 05/14/2020   Procedure: ATRIAL FIBRILLATION ABLATION;  Surgeon: Hillis Range, MD;  Location: [facility] INVASIVE CV LAB;  Service: Cardiovascular;  Laterality: N/A;  . CARDIOVERSION N/A 07/04/2019   Procedure: CARDIOVERSION;  Surgeon: Quintella Reichert, MD;  Location: [facility] ENDOSCOPY;  Service: Cardiovascular;  Laterality: N/A;  . CARDIOVERSION N/A 11/30/2019   Procedure: CARDIOVERSION;  Surgeon: Lewayne Bunting, MD;  Location: [facility] ENDOSCOPY;  Service: Cardiovascular;  Laterality: N/A;  . CARDIOVERSION N/A 04/02/2020   Procedure: CARDIOVERSION;  Surgeon: Chilton Si, MD;  Location: [facility] ENDOSCOPY;  Service: Cardiovascular;  Laterality: N/A;  . MENISCUS REPAIR    . TEE WITHOUT CARDIOVERSION N/A 06/05/2019   Procedure: TRANSESOPHAGEAL ECHOCARDIOGRAM (TEE);  Surgeon: Wendall Stade, MD;  Location: [facility] ENDOSCOPY;  Service: Cardiovascular;  Laterality: N/A;  . TEE WITHOUT  CARDIOVERSION N/A 07/04/2019   Procedure: TRANSESOPHAGEAL ECHOCARDIOGRAM (TEE);  Surgeon: Quintella Reichert, MD;  Location: [facility] ENDOSCOPY;  Service: Cardiovascular;  Laterality: N/A;    ROS- all systems are personally reviewed and negatives except as per HPI above  Current Outpatient Medications  Medication Sig Dispense Refill  . atorvastatin (LIPITOR) 40 MG tablet TAKE 1 TABLET(40 MG) BY MOUTH DAILY AT 6 PM 30 tablet 11  . ELIQUIS 5 MG TABS tablet TAKE 1 TABLET(5 MG) BY MOUTH TWICE DAILY 60 tablet 5  . losartan (COZAAR) 25 MG tablet TAKE 1 TABLET(25 MG) BY MOUTH AT BEDTIME 30 tablet 11  . zolpidem (AMBIEN) 10 MG tablet Take 10 mg by mouth at bedtime as needed for sleep.     No current facility-administered medications for this visit.    Physical Exam: Vitals:   08/12/20 0825  BP: 122/70  Pulse: 68  SpO2: 96%  Weight: 191 lb 9.6 oz (86.9 kg)  Height: 6\' 1"  (1.854 m)    GEN- The patient is well appearing, alert and oriented x 3 today.   Head- normocephalic, atraumatic Eyes-  Sclera clear, conjunctiva pink Ears- hearing intact Oropharynx- clear Lungs- Clear to ausculation bilaterally, normal work of breathing Heart- Regular rate and rhythm, no murmurs, rubs or gallops, PMI not laterally displaced GI- soft, NT, ND, + BS Extremities- no clubbing, cyanosis, or edema  EKG tracing ordered today is personally reviewed and shows sinus  Assessment and Plan:  1. Persistent atrial fibrillation Doing well s/p ablation chads2vasc score is 1-2.  He is on eliquis We discussed pros and cons to anticoagulation at length today.  He  would like to consider stopping eliquis and starting ASA 81mg  daily.  We discussed AVEROES trial results which support eliquis as perhaps his best long term option.  He will think about options and we will discuss further on return in 3 months  2. NiCM Resolved with sinus  3. OSA Compliant with CPAP  4. Coronary calcification On statin Follows with Dr    Return to see me in 3 months  Graciela Husbands MD, [facility] 08/12/2020 8:27 AM

## 2020-09-26 ENCOUNTER — Other Ambulatory Visit: Payer: Self-pay | Admitting: Internal Medicine

## 2020-09-26 DIAGNOSIS — Z01812 Encounter for preprocedural laboratory examination: Secondary | ICD-10-CM

## 2020-09-26 DIAGNOSIS — I4819 Other persistent atrial fibrillation: Secondary | ICD-10-CM

## 2020-09-27 ENCOUNTER — Other Ambulatory Visit: Payer: Self-pay

## 2020-09-27 ENCOUNTER — Ambulatory Visit: Payer: Commercial Managed Care - PPO | Admitting: Internal Medicine

## 2020-09-27 ENCOUNTER — Other Ambulatory Visit: Payer: Commercial Managed Care - PPO | Admitting: *Deleted

## 2020-09-27 ENCOUNTER — Encounter: Payer: Self-pay | Admitting: Internal Medicine

## 2020-09-27 ENCOUNTER — Other Ambulatory Visit: Payer: Commercial Managed Care - PPO

## 2020-09-27 VITALS — BP 102/70 | HR 67 | Ht 73.0 in | Wt 191.0 lb

## 2020-09-27 DIAGNOSIS — I4819 Other persistent atrial fibrillation: Secondary | ICD-10-CM | POA: Diagnosis not present

## 2020-09-27 NOTE — Patient Instructions (Signed)
Medication Instructions:  Your physician recommends that you continue on your current medications as directed. Please refer to the Current Medication list given to you today.  *If you need a refill on your cardiac medications before your next appointment, please call your pharmacy*   Lab Work: None ordered.  If you have labs (blood work) drawn today and your tests are completely normal, you will receive your results only by: Marland Kitchen MyChart Message (if you have MyChart) OR . A paper copy in the mail If you have any lab test that is abnormal or we need to change your treatment, we will call you to review the results.   Testing/Procedures: Your physician has recommended that you have a Cardioversion (DCCV). Electrical Cardioversion uses a jolt of electricity to your heart either through paddles or wired patches attached to your chest. This is a controlled, usually prescheduled, procedure. Defibrillation is done under light anesthesia in the hospital, and you usually go home the day of the procedure. This is done to get your heart back into a normal rhythm. You are not awake for the procedure. Please see the instruction sheet given to you today.    Follow-Up: At [facility], you and your health needs are our priority.  As part of our continuing mission to provide you with exceptional heart care, we have created designated Provider Care Teams.  These Care Teams include your primary Cardiologist (physician) and Advanced Practice Providers (APPs -  Physician Assistants and Nurse Practitioners) who all work together to provide you with the care you need, when you need it.  We recommend signing up for the patient portal called "MyChart".  Sign up information is provided on this After Visit Summary.  MyChart is used to connect with patients for Virtual Visits (Telemedicine).  Patients are able to view lab/test results, encounter notes, upcoming appointments, etc.  Non-urgent messages can be sent to your  provider as well.   To learn more about what you can do with MyChart, go to [email or web address].    Your next appointment:   To be scheduled

## 2020-09-27 NOTE — Progress Notes (Signed)
Patient Care Team: Geoffry Paradise, MD as PCP - General (Internal Medicine)   HPI  Steven Gray is a 70 y.o. male seen following presentation with congestive heart failure found to be in atrial fibrillation with a rapid rate and with a cardiomyopathy with depressed LV function.  Initial efforts at TEE cardioversion were complicated by the presence of left atrial clot.  He was also found to have severe left atrial enlargement.  4 weeks of anticoagulation resulted in resolution of the clot and he underwent cardioversion and feels much much better.    Taking beta-blockers and losartan.  Interval atrial fibrillation 7/21 and again 11/21   Underwent catheter ablation for atrial fibrillation 1/22 (JA) preprocedural CT scan demonstrated coronary calcification.  Myoview scanning was negative.  Started on statins  Episode of persistent heart rates in the 120s.  Now has an AliveCor but not at that point.   Tolerating Apixoban  Without bleeding  Recurrent afib since Monday  Taking anticoagulation     The patient denies chest pain, shortness of breath, nocturnal dyspnea, orthopnea or peripheral edema.  There have been no palpitations, lightheadedness or syncop. .   Today    DATE TEST EF   1/21 Echo  30-35 % LAE-severe (70ml/m2)  *3/21 TEE 30-35 %   4/21 Echo  60-65%   6/21 cMRI 62% LAE mod-RAE mild LGE neg  1/22 CTA   calcification noted in the LAD left main and a calcium score of 719  2/22 Myoview  No ischemia        Date Cr K Hgb  2/21 1.36 4.4 15.8  7/21  1.19 4.0 14.9  1/22 1.02 4.8 14.8   Thromboembolic risk factors ( age -85 , CHF-0.5) for a CHADSVASc Score of 1.5   Records and Results Reviewed   Past Medical History:  Diagnosis Date  . Alcohol use 06/03/2019   he says not heavy  . 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 Hyperlipidemia    does not take medication for this, denies  . Increased prostate specific antigen (PSA) velocity   . Left  atrial enlargement   . 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  . Persistent atrial fibrillation (HCC) 06/03/2019  . Right knee pain 12/07/2016  . Sleep apnea    wears CPAP    Past Surgical History:  Procedure Laterality Date  . ATRIAL FIBRILLATION ABLATION N/A 05/14/2020   Procedure: ATRIAL FIBRILLATION ABLATION;  Surgeon: Hillis Range, MD;  Location: [facility];  Service: Cardiovascular;  Laterality: N/A;  . CARDIOVERSION N/A 07/04/2019   Procedure: CARDIOVERSION;  Surgeon: Quintella Reichert, MD;  Location: [facility] ENDOSCOPY;  Service: Cardiovascular;  Laterality: N/A;  . CARDIOVERSION N/A 11/30/2019   Procedure: CARDIOVERSION;  Surgeon: Lewayne Bunting, MD;  Location: [facility] ENDOSCOPY;  Service: Cardiovascular;  Laterality: N/A;  . CARDIOVERSION N/A 04/02/2020   Procedure: CARDIOVERSION;  Surgeon: Chilton Si, MD;  Location: [facility] ENDOSCOPY;  Service: Cardiovascular;  Laterality: N/A;  . MENISCUS REPAIR    . TEE WITHOUT CARDIOVERSION N/A 06/05/2019   Procedure: TRANSESOPHAGEAL ECHOCARDIOGRAM (TEE);  Surgeon: Wendall Stade, MD;  Location: [facility] ENDOSCOPY;  Service: Cardiovascular;  Laterality: N/A;  . TEE WITHOUT CARDIOVERSION N/A 07/04/2019   Procedure: TRANSESOPHAGEAL ECHOCARDIOGRAM (TEE);  Surgeon: Quintella Reichert, MD;  Location: [facility] ENDOSCOPY;  Service: Cardiovascular;  Laterality: N/A;    Current Meds  Medication Sig  . atorvastatin (LIPITOR) 40 MG tablet TAKE 1 TABLET(40 MG) BY  MOUTH DAILY AT 6 PM  . ELIQUIS 5 MG TABS tablet TAKE 1 TABLET(5 MG) BY MOUTH TWICE DAILY  . losartan (COZAAR) 25 MG tablet TAKE 1 TABLET(25 MG) BY MOUTH AT BEDTIME  . zolpidem (AMBIEN) 10 MG tablet Take 10 mg by mouth at bedtime as needed for sleep.  Is which is  No Known Allergies    Review of Systems negative except from HPI and PMH  Physical Exam BP 102/70   Pulse 67   Ht 6\' 1"  (1.854 m)   Wt 191 lb (86.6 kg)   SpO2 97%   BMI 25.20 kg/m  Well developed and  nourished in no acute distress HENT normal Neck supple with JVP-  flat  Clear Regular rate and rhythm, no murmurs or gallops Abd-soft with active BS No Clubbing cyanosis edema Skin-warm and dry A & Oriented  Grossly normal sensory and motor function  ECG    CrCl cannot be calculated (Patient's most recent lab result is older than the maximum 21 days allowed.).   Assessment and  Plan Atrial fibrillation-persistent  Cardiomyopathy-presumed nonischemic question rate related  Left atrial enlargement-moderate<<-severe  Coronary artery calcification-calcium score 719 with negative Myoview  Alcohol use-    Atrial fibrillation-recurrent disappointed.  Not particularly symptomatic.  Discussed neck steps which would include cardioversion and then consideration of repeat catheter ablation.  He would like to pursue that.  We will arrange earliest possible time

## 2020-09-28 LAB — BASIC METABOLIC PANEL
BUN/Creatinine Ratio: 12 (ref 10–24)
BUN: 16 mg/dL (ref 8–27)
CO2: 25 mmol/L (ref 20–29)
Calcium: 10.1 mg/dL (ref 8.6–10.2)
Chloride: 103 mmol/L (ref 96–106)
Creatinine, Ser: 1.35 mg/dL — ABNORMAL HIGH (ref 0.76–1.27)
Glucose: 150 mg/dL — ABNORMAL HIGH (ref 65–99)
Potassium: 4.5 mmol/L (ref 3.5–5.2)
Sodium: 141 mmol/L (ref 134–144)
eGFR: 57 mL/min/{1.73_m2} — ABNORMAL LOW (ref >59)

## 2020-09-28 LAB — CBC
Hematocrit: 43.7 % (ref 37.5–51.0)
Hemoglobin: 15.2 g/dL (ref 13.0–17.7)
MCH: 33.3 pg — ABNORMAL HIGH (ref 26.6–33.0)
MCHC: 34.8 g/dL (ref 31.5–35.7)
MCV: 96 fL (ref 79–97)
Platelets: 196 10*3/uL (ref 150–450)
RBC: 4.56 x10E6/uL (ref 4.14–5.80)
RDW: 11.6 % (ref 11.6–15.4)
WBC: 12.4 10*3/uL — ABNORMAL HIGH (ref 3.4–10.8)

## 2020-10-01 ENCOUNTER — Other Ambulatory Visit: Payer: Commercial Managed Care - PPO

## 2020-10-02 ENCOUNTER — Ambulatory Visit
Admission: RE | Admit: 2020-10-02 | Discharge: 2020-10-02 | Disposition: A | Discharge status: Home / Self Care | Payer: Commercial Managed Care - PPO | Attending: Cardiovascular Disease | Admitting: Cardiovascular Disease

## 2020-10-02 ENCOUNTER — Encounter
Admission: RE | Disposition: A | Discharge status: Home / Self Care | Payer: Self-pay | Source: Home / Self Care | Attending: Cardiovascular Disease

## 2020-10-02 ENCOUNTER — Ambulatory Visit: Payer: Commercial Managed Care - PPO | Admitting: Certified Registered Nurse Anesthetist

## 2020-10-02 ENCOUNTER — Encounter: Payer: Self-pay | Admitting: Cardiovascular Disease

## 2020-10-02 ENCOUNTER — Other Ambulatory Visit: Payer: Self-pay

## 2020-10-02 DIAGNOSIS — I4891 Unspecified atrial fibrillation: Secondary | ICD-10-CM | POA: Diagnosis not present

## 2020-10-02 DIAGNOSIS — Z5309 Procedure and treatment not carried out because of other contraindication: Secondary | ICD-10-CM | POA: Insufficient documentation

## 2020-10-02 HISTORY — PX: CARDIOVERSION: SHX1299

## 2020-10-02 SURGERY — CARDIOVERSION
Anesthesia: General

## 2020-10-02 NOTE — Progress Notes (Signed)
Dr. Mariah Milling in to see pt., wife and reviewed 12 lead EKG. Pt. Released by MD. Stable for DC home with wife. No procedure performed.

## 2020-10-02 NOTE — Progress Notes (Signed)
Spoke with Dr. Mariah Milling ; 12 lead EKG sent to MD phone. Anesthesia came by and saw EKG. Await MD arrival to confirm NSR.

## 2020-10-24 ENCOUNTER — Telehealth: Payer: Self-pay

## 2020-10-24 DIAGNOSIS — Z01812 Encounter for preprocedural laboratory examination: Secondary | ICD-10-CM

## 2020-10-24 DIAGNOSIS — I4819 Other persistent atrial fibrillation: Secondary | ICD-10-CM

## 2020-10-24 NOTE — Telephone Encounter (Signed)
Spoke with Dr Graciela Husbands who states pt is back in Afib and will need DCCV scheduled.  DCCV scheduled for 11/06/2020 at 130pm.  Instruction letter reviewed with pt and placed on MyChart.  See letter for complete details.  Reviewed ED precautions.  Pt verbalizes understanding and agrees with current plan.

## 2020-10-29 NOTE — Progress Notes (Signed)
review 

## 2020-10-30 ENCOUNTER — Other Ambulatory Visit: Payer: Commercial Managed Care - PPO | Admitting: *Deleted

## 2020-10-30 ENCOUNTER — Encounter: Payer: Self-pay | Admitting: *Deleted

## 2020-10-30 ENCOUNTER — Encounter: Payer: Self-pay | Admitting: Internal Medicine

## 2020-10-30 ENCOUNTER — Other Ambulatory Visit: Payer: Self-pay

## 2020-10-30 ENCOUNTER — Ambulatory Visit: Payer: Commercial Managed Care - PPO | Admitting: Internal Medicine

## 2020-10-30 VITALS — BP 98/66 | HR 66 | Ht 73.0 in | Wt 194.0 lb

## 2020-10-30 DIAGNOSIS — I428 Other cardiomyopathies: Secondary | ICD-10-CM

## 2020-10-30 DIAGNOSIS — I4819 Other persistent atrial fibrillation: Secondary | ICD-10-CM | POA: Diagnosis not present

## 2020-10-30 DIAGNOSIS — G4733 Obstructive sleep apnea (adult) (pediatric): Secondary | ICD-10-CM

## 2020-10-30 DIAGNOSIS — Z01812 Encounter for preprocedural laboratory examination: Secondary | ICD-10-CM

## 2020-10-30 LAB — CBC
Hematocrit: 43.8 % (ref 37.5–51.0)
Hemoglobin: 14.8 g/dL (ref 13.0–17.7)
MCH: 33.3 pg — ABNORMAL HIGH (ref 26.6–33.0)
MCHC: 33.8 g/dL (ref 31.5–35.7)
MCV: 98 fL — ABNORMAL HIGH (ref 79–97)
Platelets: 194 10*3/uL (ref 150–450)
RBC: 4.45 x10E6/uL (ref 4.14–5.80)
RDW: 12.7 % (ref 11.6–15.4)
WBC: 8.2 10*3/uL (ref 3.4–10.8)

## 2020-10-30 LAB — BASIC METABOLIC PANEL
BUN/Creatinine Ratio: 17 (ref 10–24)
BUN: 23 mg/dL (ref 8–27)
CO2: 29 mmol/L (ref 20–29)
Calcium: 9.5 mg/dL (ref 8.6–10.2)
Chloride: 105 mmol/L (ref 96–106)
Creatinine, Ser: 1.39 mg/dL — ABNORMAL HIGH (ref 0.76–1.27)
Glucose: 150 mg/dL — ABNORMAL HIGH (ref 65–99)
Potassium: 5.6 mmol/L — ABNORMAL HIGH (ref 3.5–5.2)
Sodium: 141 mmol/L (ref 134–144)
eGFR: 55 mL/min/{1.73_m2} — ABNORMAL LOW (ref >59)

## 2020-10-30 NOTE — H&P (View-Only) (Signed)
 PCP: Aronson, Richard, MD   Primary EP: Dr Klein  Steven Gray is a 69 y.o. male who presents today for routine electrophysiology followup.  Since last being seen in our clinic, the patient reports doing reasonably well.  Unfortunately, he is persistently back in afib.  + fatigue.  Today, he denies symptoms of palpitations, chest pain, shortness of breath,  lower extremity edema, dizziness, presyncope, or syncope.  The patient is otherwise without complaint today.   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   Persistent atrial fibrillation (HCC) 06/03/2019   Right knee pain 12/07/2016   Sleep apnea    wears CPAP   Past Surgical History:  Procedure Laterality Date   ATRIAL FIBRILLATION ABLATION N/A 05/14/2020   Procedure: ATRIAL FIBRILLATION ABLATION;  Surgeon: Kalima Saylor, MD;  Location: [facility] INVASIVE CV LAB;  Service: Cardiovascular;  Laterality: N/A;   CARDIOVERSION N/A 07/04/2019   Procedure: CARDIOVERSION;  Surgeon: Turner, Traci R, MD;  Location: [facility] ENDOSCOPY;  Service: Cardiovascular;  Laterality: N/A;   CARDIOVERSION N/A 11/30/2019   Procedure: CARDIOVERSION;  Surgeon: Crenshaw, Brian S, MD;  Location: [facility] ENDOSCOPY;  Service: Cardiovascular;  Laterality: N/A;   CARDIOVERSION N/A 04/02/2020   Procedure: CARDIOVERSION;  Surgeon: Nile, Tiffany, MD;  Location: [facility] ENDOSCOPY;  Service: Cardiovascular;  Laterality: N/A;   CARDIOVERSION N/A 10/02/2020   Procedure: CARDIOVERSION;  Surgeon: Gollan, Timothy J, MD;  Location: [facility] ORS;  Service: Cardiovascular;  Laterality: N/A;   MENISCUS REPAIR     TEE WITHOUT CARDIOVERSION N/A 06/05/2019   Procedure: TRANSESOPHAGEAL ECHOCARDIOGRAM  (TEE);  Surgeon: Nishan, Peter C, MD;  Location: [facility] ENDOSCOPY;  Service: Cardiovascular;  Laterality: N/A;   TEE WITHOUT CARDIOVERSION N/A 07/04/2019   Procedure: TRANSESOPHAGEAL ECHOCARDIOGRAM (TEE);  Surgeon: Turner, Traci R, MD;  Location: [facility] ENDOSCOPY;  Service: Cardiovascular;  Laterality: N/A;    ROS- all systems are reviewed and negatives except as per HPI above  Current Outpatient Medications  Medication Sig Dispense Refill   atorvastatin (LIPITOR) 40 MG tablet TAKE 1 TABLET(40 MG) BY MOUTH DAILY AT 6 PM 30 tablet 11   ELIQUIS 5 MG TABS tablet TAKE 1 TABLET(5 MG) BY MOUTH TWICE DAILY 60 tablet 5   losartan (COZAAR) 25 MG tablet TAKE 1 TABLET(25 MG) BY MOUTH AT BEDTIME 30 tablet 11   zolpidem (AMBIEN) 10 MG tablet Take 10 mg by mouth at bedtime as needed for sleep.     No current facility-administered medications for this visit.    Physical Exam: Vitals:   10/30/20 0759  BP: 98/66  Pulse: 66  SpO2: 96%  Weight: 194 lb (88 kg)  Height: 6' 1" (1.854 m)    GEN- The patient is well appearing, alert and oriented x 3 today.   Head- normocephalic, atraumatic Eyes-  Sclera clear, conjunctiva pink Ears- hearing intact Oropharynx- clear Lungs-  normal work of breathing Heart- irregular rate and rhythm  GI- soft,  Extremities- no clubbing, cyanosis, or edema  Wt Readings from Last 3 Encounters:  10/30/20 194 lb (88 kg)  10/02/20 190 lb 14.7 oz (86.6 kg)  09/27/20 191 lb (86.6 kg)    EKG tracing ordered today is personally reviewed   and shows afib, 66 bpm  Prior echos and Dr Koren Bound notes are reviewed My prior ablation note is reviewed  Assessment and Plan:  Persistent afib The patient has symptomatic, recurrent persistent atrial fibrillation post ablation.  Chads2vasc score is 1.  he is anticoagulated with eliquis . Therapeutic strategies for afib including medicine (tikosyn), convergent procedure, and repeat ablation were discussed in detail with the patient today.  Risk, benefits, and alternatives to EP study and radiofrequency ablation for afib were also discussed in detail today. These risks include but are not limited to stroke, bleeding, vascular damage, tamponade, perforation, damage to the esophagus, lungs, and other structures, pulmonary vein stenosis, worsening renal function, and death. The patient understands these risk and wishes to proceed.  We will therefore proceed with catheter ablation at the next available time.  Carto, ICE, anesthesia are requested for the procedure.  Will also obtain cardiac CT prior to the procedure to exclude LAA thrombus and further evaluate atrial anatomy.  2. Nonischemic CM EF has recovered with sinus We should try to keep him in sinus long term  3. OSA Compliant with CPAP  Risks, benefits and potential toxicities for medications prescribed and/or refilled reviewed with patient today.   Hillis Range MD, [facility] 10/30/2020 8:28 AM

## 2020-10-30 NOTE — Patient Instructions (Addendum)
Medication Instructions:  Your physician recommends that you continue on your current medications as directed. Please refer to the Current Medication list given to you today.  Labwork: None ordered.  Testing/Procedures: Your physician has requested that you have cardiac CT. Cardiac computed tomography (CT) is a painless test that uses an x-ray machine to take clear, detailed pictures of your heart. For further information please visit www.cardiosmart.org. Please follow instruction sheet as given. Your physician has recommended that you have an ablation. Catheter ablation is a medical procedure used to treat some cardiac arrhythmias (irregular heartbeats). During catheter ablation, a long, thin, flexible tube is put into a blood vessel in your groin (upper thigh), or neck. This tube is called an ablation catheter. It is then guided to your heart through the blood vessel. Radio frequency waves destroy small areas of heart tissue where abnormal heartbeats may cause an arrhythmia to start. Please see the instruction sheet given to you today.   Any Other Special Instructions Will Be Listed Below (If Applicable).  If you need a refill on your cardiac medications before your next appointment, please call your pharmacy.   Cardiac Ablation Cardiac ablation is a procedure to destroy (ablate) some heart tissue that is sending bad signals. These bad signals causeproblems in heart rhythm. The heart has many areas that make these signals. If there are problems in these areas, they can make the heart beat in a way that is not normal.Destroying some tissues can help make the heart rhythm normal. Tell your doctor about: Any allergies you have. All medicines you are taking. These include vitamins, herbs, eye drops, creams, and over-the-counter medicines. Any problems you or family members have had with medicines that make you fall asleep (anesthetics). Any blood disorders you have. Any surgeries you have  had. Any medical conditions you have, such as kidney failure. Whether you are pregnant or may be pregnant. What are the risks? This is a safe procedure. But problems may occur, including: Infection. Bruising and bleeding. Bleeding into the chest. Stroke or blood clots. Damage to nearby areas of your body. Allergies to medicines or dyes. The need for a pacemaker if the normal system is damaged. Failure of the procedure to treat the problem. What happens before the procedure? Medicines Ask your doctor about: Changing or stopping your normal medicines. This is important. Taking aspirin and ibuprofen. Do not take these medicines unless your doctor tells you to take them. Taking other medicines, vitamins, herbs, and supplements. General instructions Follow instructions from your doctor about what you cannot eat or drink. Plan to have someone take you home from the hospital or clinic. If you will be going home right after the procedure, plan to have someone with you for 24 hours. Ask your doctor what steps will be taken to prevent infection. What happens during the procedure?  An IV tube will be put into one of your veins. You will be given a medicine to help you relax. The skin on your neck or groin will be numbed. A cut (incision) will be made in your neck or groin. A needle will be put through your cut and into a large vein. A tube (catheter) will be put into the needle. The tube will be moved to your heart. Dye may be put through the tube. This helps your doctor see your heart. Small devices (electrodes) on the tube will send out signals. A type of energy will be used to destroy some heart tissue. The tube will be taken   out. Pressure will be held on your cut. This helps stop bleeding. A bandage will be put over your cut. The exact procedure may vary among doctors and hospitals. What happens after the procedure? You will be watched until you leave the hospital or clinic. This  includes checking your heart rate, breathing rate, oxygen, and blood pressure. Your cut will be watched for bleeding. You will need to lie still for a few hours. Do not drive for 24 hours or as long as your doctor tells you. Summary Cardiac ablation is a procedure to destroy some heart tissue. This is done to treat heart rhythm problems. Tell your doctor about any medical conditions you may have. Tell him or her about all medicines you are taking to treat them. This is a safe procedure. But problems may occur. These include infection, bruising, bleeding, and damage to nearby areas of your body. Follow what your doctor tells you about food and drink. You may also be told to change or stop some of your medicines. After the procedure, do not drive for 24 hours or as long as your doctor tells you. This information is not intended to replace advice given to you by your health care provider. Make sure you discuss any questions you have with your healthcare provider. Document Revised: 03/23/2019 Document Reviewed: 03/23/2019 Elsevier Patient Education  2022 Elsevier Inc.      

## 2020-10-30 NOTE — Progress Notes (Signed)
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  Since last being seen in our clinic, the patient reports doing reasonably well.  Unfortunately, he is persistently back in afib.  + fatigue.  Today, he denies symptoms of palpitations, chest pain, shortness of breath,  lower extremity edema, dizziness, presyncope, or syncope.  The patient is otherwise without complaint today.   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   Persistent atrial fibrillation (HCC) 06/03/2019   Right knee pain 12/07/2016   Sleep apnea    wears CPAP   Past Surgical History:  Procedure Laterality Date   ATRIAL FIBRILLATION ABLATION N/A 05/14/2020   Procedure: ATRIAL FIBRILLATION ABLATION;  Surgeon: Hillis Range, MD;  Location: [facility] INVASIVE CV LAB;  Service: Cardiovascular;  Laterality: N/A;   CARDIOVERSION N/A 07/04/2019   Procedure: CARDIOVERSION;  Surgeon: Quintella Reichert, MD;  Location: [facility] ENDOSCOPY;  Service: Cardiovascular;  Laterality: N/A;   CARDIOVERSION N/A 11/30/2019   Procedure: CARDIOVERSION;  Surgeon: Lewayne Bunting, MD;  Location: [facility] ENDOSCOPY;  Service: Cardiovascular;  Laterality: N/A;   CARDIOVERSION N/A 04/02/2020   Procedure: CARDIOVERSION;  Surgeon: Chilton Si, MD;  Location: [facility] ENDOSCOPY;  Service: Cardiovascular;  Laterality: N/A;   CARDIOVERSION N/A 10/02/2020   Procedure: CARDIOVERSION;  Surgeon: Antonieta Iba, MD;  Location: [facility] ORS;  Service: Cardiovascular;  Laterality: N/A;   MENISCUS REPAIR     TEE WITHOUT CARDIOVERSION N/A 06/05/2019   Procedure: TRANSESOPHAGEAL ECHOCARDIOGRAM  (TEE);  Surgeon: Wendall Stade, MD;  Location: [facility] ENDOSCOPY;  Service: Cardiovascular;  Laterality: N/A;   TEE WITHOUT CARDIOVERSION N/A 07/04/2019   Procedure: TRANSESOPHAGEAL ECHOCARDIOGRAM (TEE);  Surgeon: Quintella Reichert, MD;  Location: [facility] ENDOSCOPY;  Service: Cardiovascular;  Laterality: N/A;    ROS- all systems are reviewed and negatives except as per HPI above  Current Outpatient Medications  Medication Sig Dispense Refill   atorvastatin (LIPITOR) 40 MG tablet TAKE 1 TABLET(40 MG) BY MOUTH DAILY AT 6 PM 30 tablet 11   ELIQUIS 5 MG TABS tablet TAKE 1 TABLET(5 MG) BY MOUTH TWICE DAILY 60 tablet 5   losartan (COZAAR) 25 MG tablet TAKE 1 TABLET(25 MG) BY MOUTH AT BEDTIME 30 tablet 11   zolpidem (AMBIEN) 10 MG tablet Take 10 mg by mouth at bedtime as needed for sleep.     No current facility-administered medications for this visit.    Physical Exam: Vitals:   10/30/20 0759  BP: 98/66  Pulse: 66  SpO2: 96%  Weight: 194 lb (88 kg)  Height: 6\' 1"  (1.854 m)    GEN- The patient is well appearing, alert and oriented x 3 today.   Head- normocephalic, atraumatic Eyes-  Sclera clear, conjunctiva pink Ears- hearing intact Oropharynx- clear Lungs-  normal work of breathing Heart- irregular rate and rhythm  GI- soft,  Extremities- no clubbing, cyanosis, or edema  Wt Readings from Last 3 Encounters:  10/30/20 194 lb (88 kg)  10/02/20 190 lb 14.7 oz (86.6 kg)  09/27/20 191 lb (86.6 kg)    EKG tracing ordered today is personally reviewed  and shows afib, 66 bpm  Prior echos and Dr Koren Bound notes are reviewed My prior ablation note is reviewed  Assessment and Plan:  Persistent afib The patient has symptomatic, recurrent persistent atrial fibrillation post ablation.  Chads2vasc score is 1.  he is anticoagulated with eliquis . Therapeutic strategies for afib including medicine (tikosyn), convergent procedure, and repeat ablation were discussed in detail with the patient today.  Risk, benefits, and alternatives to EP study and radiofrequency ablation for afib were also discussed in detail today. These risks include but are not limited to stroke, bleeding, vascular damage, tamponade, perforation, damage to the esophagus, lungs, and other structures, pulmonary vein stenosis, worsening renal function, and death. The patient understands these risk and wishes to proceed.  We will therefore proceed with catheter ablation at the next available time.  Carto, ICE, anesthesia are requested for the procedure.  Will also obtain cardiac CT prior to the procedure to exclude LAA thrombus and further evaluate atrial anatomy.  2. Nonischemic CM EF has recovered with sinus We should try to keep him in sinus long term  3. OSA Compliant with CPAP  Risks, benefits and potential toxicities for medications prescribed and/or refilled reviewed with patient today.   Hillis Range MD, [facility] 10/30/2020 8:28 AM

## 2020-10-31 ENCOUNTER — Telehealth: Payer: Self-pay

## 2020-10-31 ENCOUNTER — Ambulatory Visit: Payer: Commercial Managed Care - PPO | Admitting: Physician Assistant

## 2020-10-31 ENCOUNTER — Other Ambulatory Visit: Payer: Commercial Managed Care - PPO

## 2020-10-31 DIAGNOSIS — Z01812 Encounter for preprocedural laboratory examination: Secondary | ICD-10-CM

## 2020-10-31 DIAGNOSIS — E875 Hyperkalemia: Secondary | ICD-10-CM

## 2020-10-31 NOTE — Telephone Encounter (Signed)
Spoke with pt and advised per Dr Graciela Husbands K+ is elevated and will need repeat BMET.  Appointment scheduled for 10/31/2020.  Pt verbalizes understanding and agrees with current plan.

## 2020-11-01 LAB — BASIC METABOLIC PANEL
BUN/Creatinine Ratio: 14 (ref 10–24)
BUN: 20 mg/dL (ref 8–27)
CO2: 24 mmol/L (ref 20–29)
Calcium: 9.5 mg/dL (ref 8.6–10.2)
Chloride: 104 mmol/L (ref 96–106)
Creatinine, Ser: 1.41 mg/dL — ABNORMAL HIGH (ref 0.76–1.27)
Glucose: 151 mg/dL — ABNORMAL HIGH (ref 65–99)
Potassium: 4.7 mmol/L (ref 3.5–5.2)
Sodium: 145 mmol/L — ABNORMAL HIGH (ref 134–144)
eGFR: 54 mL/min/{1.73_m2} — ABNORMAL LOW (ref >59)

## 2020-11-06 ENCOUNTER — Other Ambulatory Visit: Payer: Self-pay

## 2020-11-06 ENCOUNTER — Ambulatory Visit (HOSPITAL_COMMUNITY): Payer: Commercial Managed Care - PPO

## 2020-11-06 ENCOUNTER — Encounter (HOSPITAL_COMMUNITY): Payer: Self-pay | Admitting: Cardiology

## 2020-11-06 ENCOUNTER — Encounter (HOSPITAL_COMMUNITY)
Admission: RE | Disposition: A | Discharge status: Home / Self Care | Payer: Self-pay | Source: Home / Self Care | Attending: Cardiology

## 2020-11-06 ENCOUNTER — Ambulatory Visit (HOSPITAL_COMMUNITY)
Admission: RE | Admit: 2020-11-06 | Discharge: 2020-11-06 | Disposition: A | Discharge status: Home / Self Care | Payer: Commercial Managed Care - PPO | Attending: Cardiology | Admitting: Cardiology

## 2020-11-06 DIAGNOSIS — I4819 Other persistent atrial fibrillation: Secondary | ICD-10-CM | POA: Insufficient documentation

## 2020-11-06 DIAGNOSIS — Z79899 Other long term (current) drug therapy: Secondary | ICD-10-CM | POA: Insufficient documentation

## 2020-11-06 DIAGNOSIS — G4733 Obstructive sleep apnea (adult) (pediatric): Secondary | ICD-10-CM | POA: Insufficient documentation

## 2020-11-06 DIAGNOSIS — Z7901 Long term (current) use of anticoagulants: Secondary | ICD-10-CM | POA: Insufficient documentation

## 2020-11-06 DIAGNOSIS — I428 Other cardiomyopathies: Secondary | ICD-10-CM | POA: Insufficient documentation

## 2020-11-06 HISTORY — PX: CARDIOVERSION: SHX1299

## 2020-11-06 SURGERY — CARDIOVERSION
Anesthesia: Monitor Anesthesia Care

## 2020-11-06 MED ORDER — SODIUM CHLORIDE 0.9 % IV SOLN: INTRAVENOUS | Status: DC | PRN

## 2020-11-06 MED ORDER — LIDOCAINE 2% (20 MG/ML) 5 ML SYRINGE
INTRAMUSCULAR | Status: DC | PRN
  Administered 2020-11-06: 50 mg via INTRAVENOUS

## 2020-11-06 MED ORDER — PROPOFOL 10 MG/ML IV BOLUS
INTRAVENOUS | Status: DC | PRN
  Administered 2020-11-06: 50 mg via INTRAVENOUS
  Administered 2020-11-06: 20 mg via INTRAVENOUS

## 2020-11-06 NOTE — CV Procedure (Signed)
Procedure:   DCCV  Indication:  Symptomatic atrial fibrillation   Procedure Note:  The patient signed informed consent.  They have had had therapeutic anticoagulation with Eliquis greater than 3 weeks.  Anesthesia was administered by Dr. Armond Hang and Lillette Boxer, CRNA.  Adequate airway was maintained throughout and vital followed per protocol.  They were cardioverted x 1 with 200J of biphasic synchronized energy.  They converted to NSR with rate 60s.  There were no apparent complications.  The patient had normal neuro status and respiratory status post procedure with vitals stable as recorded elsewhere.    Follow up:  They will continue on current medical therapy and follow up with cardiology as scheduled.  Epifanio Lesches, MD 11/06/2020 12:58 PM

## 2020-11-06 NOTE — Discharge Instructions (Signed)

## 2020-11-06 NOTE — Transfer of Care (Addendum)
Immediate Anesthesia Transfer of Care Note  Patient: Steven Gray  Procedure(s) Performed: CARDIOVERSION  Patient Location: Endoscopy Unit  Anesthesia Type:General  Level of Consciousness: drowsy  Airway & Oxygen Therapy: Patient Spontanous Breathing  Post-op Assessment: Report given to RN and Post -op Vital signs reviewed and stable  Post vital signs: Reviewed and stable  Last Vitals:  Vitals Value Taken Time  BP    Temp    Pulse 42 11/06/20 1246  Resp 21 11/06/20 1246  SpO2 98 % 11/06/20 1246  Vitals shown include unvalidated device data.  Last Pain:  Vitals:   11/06/20 1215  TempSrc: Oral  PainSc: 0-No pain         Complications: No notable events documented.

## 2020-11-06 NOTE — Interval H&P Note (Signed)
History and Physical Interval Note:  11/06/2020 12:29 PM  Steven Gray  has presented today for surgery, with the diagnosis of A-FIB.  The various methods of treatment have been discussed with the patient and family. After consideration of risks, benefits and other options for treatment, the patient has consented to  Procedure(s): CARDIOVERSION (N/A) as a surgical intervention.  The patient's history has been reviewed, patient examined, no change in status, stable for surgery.  I have reviewed the patient's chart and labs.  Questions were answered to the patient's satisfaction.     [name]

## 2020-11-06 NOTE — Anesthesia Preprocedure Evaluation (Addendum)
Anesthesia Evaluation  Patient identified by MRN, date of birth, ID band Patient awake    Reviewed: Allergy & Precautions, NPO status , Patient's Chart, lab work & pertinent test results  Airway Mallampati: II  TM Distance: >3 FB Neck ROM: Full    Dental no notable dental hx. (+) Teeth Intact, Dental Advisory Given   Pulmonary sleep apnea and Continuous Positive Airway Pressure Ventilation ,    Pulmonary exam normal breath sounds clear to auscultation       Cardiovascular +CHF  Normal cardiovascular exam+ dysrhythmias (on eliquis, no missed doses) Atrial Fibrillation  Rhythm:Irregular Rate:Normal     Neuro/Psych negative neurological ROS  negative psych ROS   GI/Hepatic negative GI ROS, (+)     substance abuse  alcohol use,   Endo/Other  negative endocrine ROS  Renal/GU negative Renal ROS  negative genitourinary   Musculoskeletal negative musculoskeletal ROS (+)   Abdominal   Peds  Hematology negative hematology ROS (+)   Anesthesia Other Findings   Reproductive/Obstetrics                            Anesthesia Physical Anesthesia Plan  ASA: 3  Anesthesia Plan: General   Post-op Pain Management:    Induction: Intravenous  PONV Risk Score and Plan: Propofol infusion and Treatment may vary due to age or medical condition  Airway Management Planned: Natural Airway  Additional Equipment:   Intra-op Plan:   Post-operative Plan:   Informed Consent: I have reviewed the patients History and Physical, chart, labs and discussed the procedure including the risks, benefits and alternatives for the proposed anesthesia with the patient or authorized representative who has indicated his/her understanding and acceptance.     Dental advisory given  Plan Discussed with: CRNA  Anesthesia Plan Comments:        Anesthesia Quick Evaluation

## 2020-11-07 ENCOUNTER — Encounter (HOSPITAL_COMMUNITY): Payer: Self-pay | Admitting: Cardiology

## 2020-11-07 NOTE — Anesthesia Postprocedure Evaluation (Signed)
Anesthesia Post Note  Patient: Steven Gray  Procedure(s) Performed: CARDIOVERSION     Patient location during evaluation: Endoscopy Anesthesia Type: General Level of consciousness: awake and alert Pain management: pain level controlled Vital Signs Assessment: post-procedure vital signs reviewed and stable Respiratory status: spontaneous breathing, nonlabored ventilation, respiratory function stable and patient connected to nasal cannula oxygen Cardiovascular status: blood pressure returned to baseline and stable Postop Assessment: no apparent nausea or vomiting Anesthetic complications: no   No notable events documented.  Last Vitals:  Vitals:   11/06/20 1300 11/06/20 1310  BP: 117/84 112/87  Pulse: 61 63  Resp: 19 13  Temp:    SpO2: 97% 97%    Last Pain:  Vitals:   11/06/20 1255  TempSrc:   PainSc: 0-No pain                 Monterio Bob L Ethelda Deangelo

## 2020-11-12 ENCOUNTER — Other Ambulatory Visit: Payer: Commercial Managed Care - PPO | Admitting: *Deleted

## 2020-11-12 ENCOUNTER — Other Ambulatory Visit: Payer: Self-pay

## 2020-11-12 DIAGNOSIS — I4819 Other persistent atrial fibrillation: Secondary | ICD-10-CM

## 2020-11-12 DIAGNOSIS — I428 Other cardiomyopathies: Secondary | ICD-10-CM

## 2020-11-12 DIAGNOSIS — G4733 Obstructive sleep apnea (adult) (pediatric): Secondary | ICD-10-CM

## 2020-11-14 LAB — CBC WITH DIFFERENTIAL/PLATELET
Basophils Absolute: 0.1 10*3/uL (ref 0.0–0.2)
Basos: 1 %
EOS (ABSOLUTE): 0.2 10*3/uL (ref 0.0–0.4)
Eos: 3 %
Hematocrit: 44.4 % (ref 37.5–51.0)
Hemoglobin: 14.9 g/dL (ref 13.0–17.7)
Immature Grans (Abs): 0 10*3/uL (ref 0.0–0.1)
Immature Granulocytes: 0 %
Lymphocytes Absolute: 1.4 10*3/uL (ref 0.7–3.1)
Lymphs: 19 %
MCH: 32.7 pg (ref 26.6–33.0)
MCHC: 33.6 g/dL (ref 31.5–35.7)
MCV: 97 fL (ref 79–97)
Monocytes Absolute: 0.6 10*3/uL (ref 0.1–0.9)
Monocytes: 8 %
Neutrophils Absolute: 4.9 10*3/uL (ref 1.4–7.0)
Neutrophils: 69 %
Platelets: 174 10*3/uL (ref 150–450)
RBC: 4.56 x10E6/uL (ref 4.14–5.80)
RDW: 11.8 % (ref 11.6–15.4)
WBC: 7.2 10*3/uL (ref 3.4–10.8)

## 2020-11-14 LAB — BASIC METABOLIC PANEL
BUN/Creatinine Ratio: 16 (ref 10–24)
BUN: 21 mg/dL (ref 8–27)
CO2: 19 mmol/L — ABNORMAL LOW (ref 20–29)
Calcium: 9.8 mg/dL (ref 8.6–10.2)
Chloride: 108 mmol/L — ABNORMAL HIGH (ref 96–106)
Creatinine, Ser: 1.32 mg/dL — ABNORMAL HIGH (ref 0.76–1.27)
Glucose: 138 mg/dL — ABNORMAL HIGH (ref 65–99)
Potassium: 4.3 mmol/L (ref 3.5–5.2)
Sodium: 148 mmol/L — ABNORMAL HIGH (ref 134–144)
eGFR: 59 mL/min/{1.73_m2} — ABNORMAL LOW (ref >59)

## 2020-11-18 ENCOUNTER — Telehealth: Payer: Self-pay | Admitting: Internal Medicine

## 2020-11-18 NOTE — Telephone Encounter (Signed)
New message   Pt is scheduled for an afib ablation and would like to push that procedure out. He said he is staying in rhythm and would like to see how the cardioversion does for him. Please call.

## 2020-11-18 NOTE — Telephone Encounter (Signed)
Educated the patient and gave next available date for ablation if moved out later. Patient has decided to stay with current procedure date at this time. Advised to call back if anything change.

## 2020-11-25 ENCOUNTER — Telehealth (HOSPITAL_COMMUNITY): Payer: Self-pay | Admitting: *Deleted

## 2020-11-25 NOTE — Telephone Encounter (Signed)
Attempted to call patient regarding upcoming cardiac CT appointment. °Left message on voicemail with name and callback number ° °Adir Schicker RN Navigator Cardiac Imaging °[facility] °[phone] Office °[phone] Cell ° °

## 2020-11-25 NOTE — Telephone Encounter (Signed)
Patient returning call regarding upcoming cardiac imaging study; pt verbalizes understanding of appt date/time, parking situation and where to check in, pre-test NPO status, and verified current allergies; name and call back number provided for further questions should they arise  Larey Brick RN Navigator Cardiac Imaging [facility] [phone] office [phone] cell  Patient had this scan about 6 months ago.

## 2020-11-26 ENCOUNTER — Ambulatory Visit (HOSPITAL_COMMUNITY)
Admission: RE | Admit: 2020-11-26 | Discharge: 2020-11-26 | Disposition: A | Discharge status: Home / Self Care | Payer: Commercial Managed Care - PPO | Source: Ambulatory Visit | Attending: Internal Medicine | Admitting: Internal Medicine

## 2020-11-26 ENCOUNTER — Other Ambulatory Visit: Payer: Self-pay

## 2020-11-26 DIAGNOSIS — I4819 Other persistent atrial fibrillation: Secondary | ICD-10-CM | POA: Insufficient documentation

## 2020-11-26 IMAGING — CT CT HEART MORPH/PULM VEIN W/ CM & W/O CA SCORE
2 of 6 series · 12 of 20 positions shown, 14 images · non-contrast
Comparison: Prior study on [DATE]
COMPARISON: Prior study on [DATE]

Addendum:
EXAM:
OVER-READ INTERPRETATION  CT CHEST

The following report is an over-read performed by radiologist Dr.
LAKSHMI [REDACTED] on [DATE]. This
over-read does not include interpretation of cardiac or coronary
anatomy or pathology. The cardiac CT interpretation by the
cardiologist is attached.
CLINICAL DATA: Atrial fibrillation scheduled for an ablation.
Cardiac CT/CTA
TECHNIQUE: The patient was scanned on a Siemens Somatom scanner.

[Series 8: 0-90% · axial · 0.40mm/px · z∈[+1130,+1234]mm · 6 of 2950 slices shown, 8 images]
[im 422/2950  vessel]
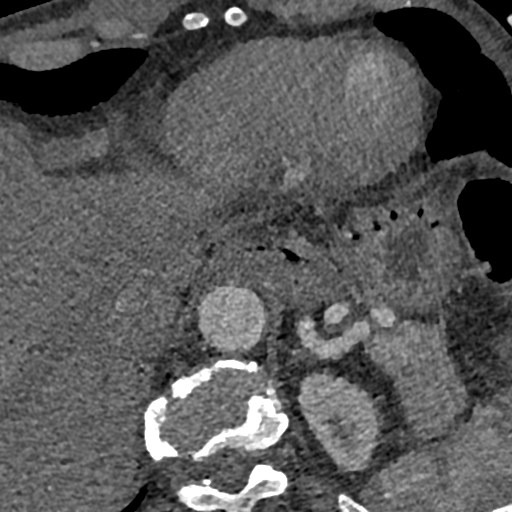
[im 422/2950  lung]
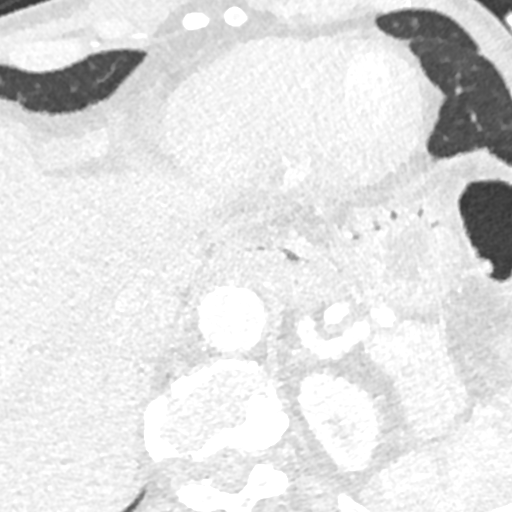
[im 843/2950  vessel]
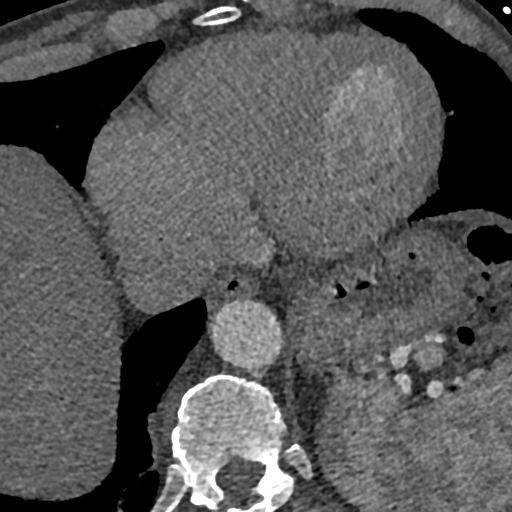
[im 1264/2950  vessel]
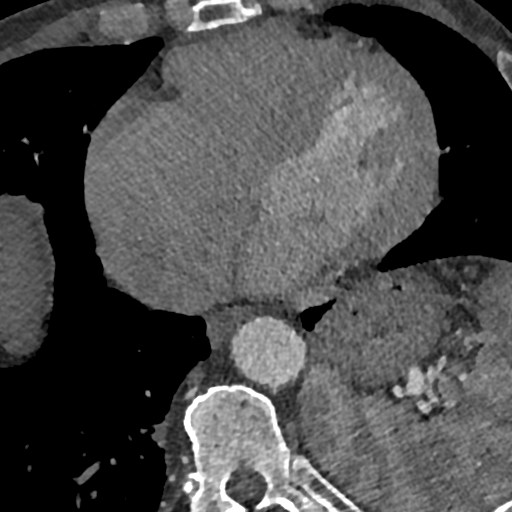
[im 1686/2950  vessel]
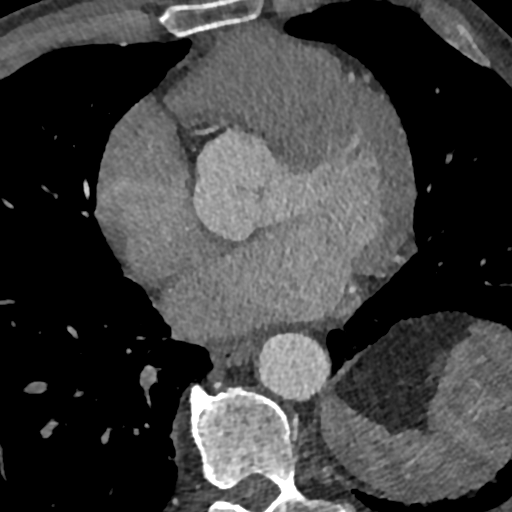
[im 2107/2950  vessel]
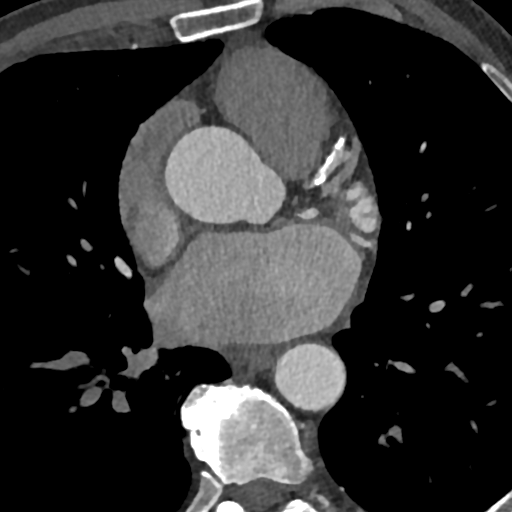
[im 2107/2950  lung]
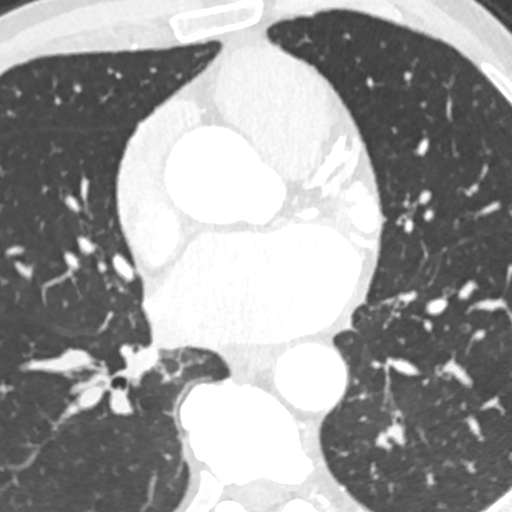
[im 2528/2950  vessel]
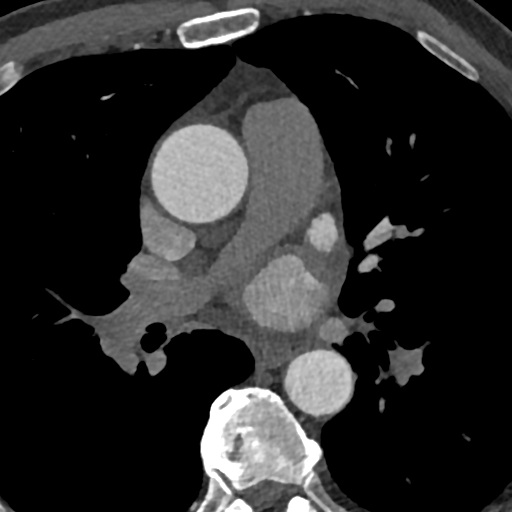

[Series 11: 5-95% · axial · 0.40mm/px · z∈[+1130,+1234]mm · 6 of 2950 slices shown]
[im 422/2950  vessel]
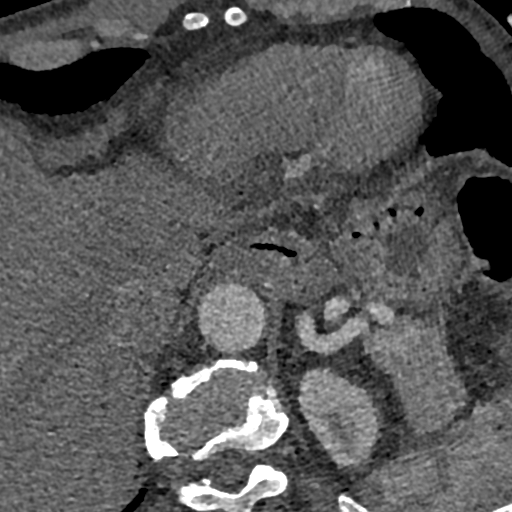
[im 843/2950  vessel]
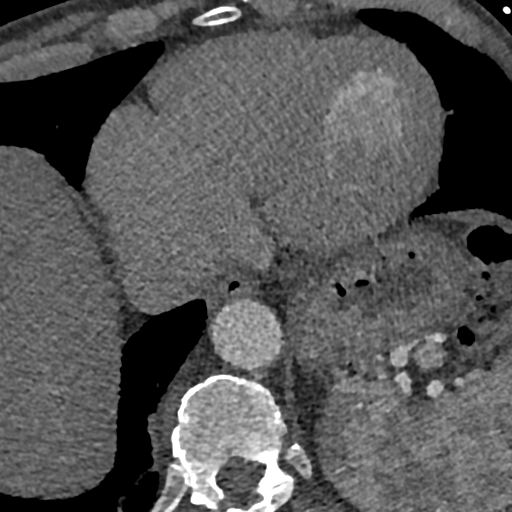
[im 1264/2950  vessel]
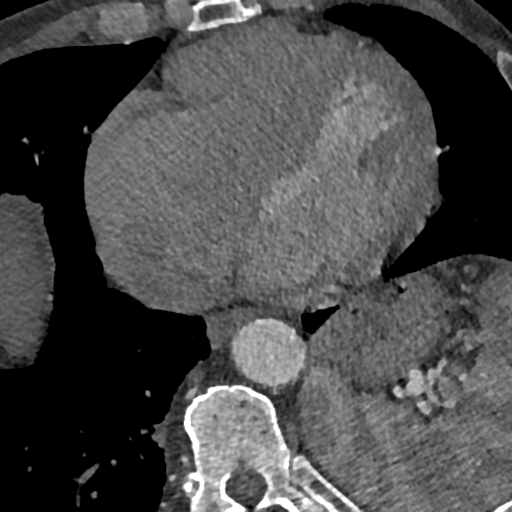
[im 1686/2950  vessel]
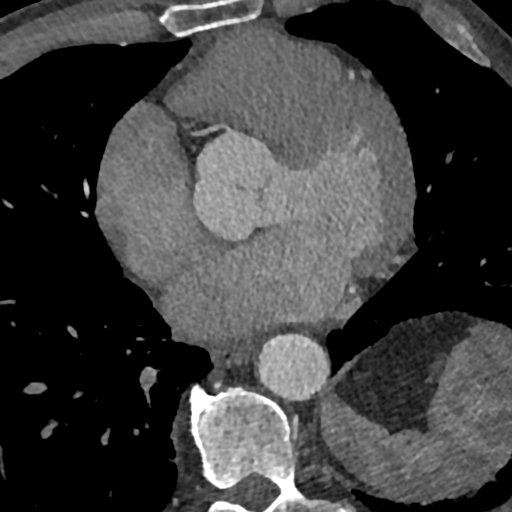
[im 2107/2950  vessel]
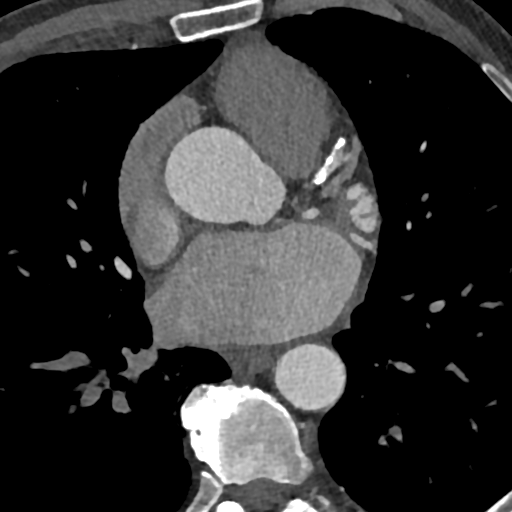
[im 2528/2950  vessel]
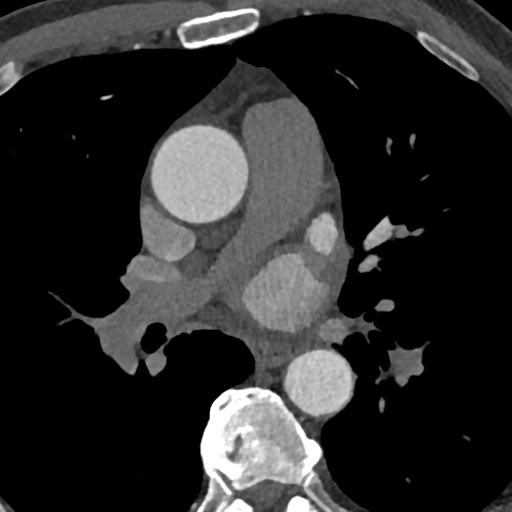

[12 of 20 positions shown; findings below may reference images not displayed]

FINDINGS: Vascular: Stable mild dilatation of the ascending thoracic aorta
measuring up to approximately 4 cm in maximum diameter.

Mediastinum/Nodes: Visualized mediastinum and hilar regions
demonstrate no lymphadenopathy or masses.

Lungs/Pleura: Visualized lungs show no evidence of pulmonary edema,
consolidation, pneumothorax, nodule or pleural fluid.

Upper Abdomen: No acute abnormality.

Musculoskeletal: No chest wall mass or suspicious bone lesions
identified.
IMPRESSION: Stable mild dilatation of the ascending thoracic aorta measuring up
to approximately 4 cm. Recommend annual imaging followup by CTA or
MRA. This recommendation follows [JM]
ACCF/AHA/AATS/ACR/ASA/SCA/LAKSHMI/LAKSHMI/LAKSHMI/LAKSHMI Guidelines for the
Diagnosis and Management of Patients with Thoracic Aortic Disease.
Circulation. [JM]; 121: [id number]. Aortic aneurysm NOS ([JM]-[JM])
FINDINGS: A 120 kV prospective scan was triggered in the descending thoracic
aorta at 111 HU's. Gantry rotation speed was 280 msecs and
collimation was .9 mm. No beta blockade and no NTG was given. The 3D
data set was reconstructed in 5% intervals of the R-R cycle. Phases
were analyzed on a dedicated work station using MPR, MIP and VRT
modes. The patient received 80 cc of contrast.

Left atrium: Moderate enlargement. No thrombus in the left atrium or
left atrial appendage.

Left ventricle: Normal in size.

Right atrium: Moderate enlargement

Right ventricle: Moderate dilatation

Pericardium: Normal thickness

Pulmonary veins:

Left superior pulmonary vein: Measures 2.0cm x 1.7cm in diameter
with area 2.7cm^2.

Left inferior pulmonary vein: Measures 1.4cm x 1.1cm in diameter
with area 1.1cm^2.

Right middle pulmonary vein: Measures 1.4cm x 1.1cm in diameter with
area 1.2cm^2.

Right superior pulmonary vein: Measures 1.8cm x 1.3cm in diameter
with area 1.7cm^2.

Right inferior pulmonary vein: Measures 1.9cm x 1.6cm in diameter
with area 2.2cm^2.

Pulmonary arteries: Dilated main pulmonary artery measuring 31mm

Coronary Arteries: Normal coronary origin. Right dominance. Calcium
score 693 (82nd percentile). The study was performed without use of
NTG and insufficient for plaque evaluation.

Esophagus: Courses posterior to the body of the left atrium
IMPRESSION: 1. There is normal pulmonary vein drainage into the left atrium.
Measurements as reported

2. There is no thrombus in the left atrial appendage.

3. The esophagus runs in the left atrial midline and is not in the
proximity to any of the pulmonary veins.

4. Coronary calcium score 693 (82nd percentile)

*** End of Addendum ***
EXAM:
OVER-READ INTERPRETATION  CT CHEST

The following report is an over-read performed by radiologist Dr.
LAKSHMI [REDACTED] on [DATE]. This
over-read does not include interpretation of cardiac or coronary
anatomy or pathology. The cardiac CT interpretation by the
cardiologist is attached.
FINDINGS: Vascular: Stable mild dilatation of the ascending thoracic aorta
measuring up to approximately 4 cm in maximum diameter.

Mediastinum/Nodes: Visualized mediastinum and hilar regions
demonstrate no lymphadenopathy or masses.

Lungs/Pleura: Visualized lungs show no evidence of pulmonary edema,
consolidation, pneumothorax, nodule or pleural fluid.

Upper Abdomen: No acute abnormality.

Musculoskeletal: No chest wall mass or suspicious bone lesions
identified.
IMPRESSION: Stable mild dilatation of the ascending thoracic aorta measuring up
to approximately 4 cm. Recommend annual imaging followup by CTA or
MRA. This recommendation follows [JM]
ACCF/AHA/AATS/ACR/ASA/SCA/LAKSHMI/LAKSHMI/LAKSHMI/LAKSHMI Guidelines for the
Diagnosis and Management of Patients with Thoracic Aortic Disease.
Circulation. [JM]; 121: [id number]. Aortic aneurysm NOS ([JM]-[JM])

## 2020-11-26 MED ORDER — IOHEXOL 350 MG/ML SOLN
80.0000 mL | Freq: Once | INTRAVENOUS | Status: AC | PRN
  Administered 2020-11-26: 80 mL via INTRAVENOUS

## 2020-12-02 NOTE — Pre-Procedure Instructions (Signed)
Instructed patient on the following items: Arrival time 0530 Nothing to eat or drink after midnight No meds AM of procedure Responsible person to drive you home and stay with you for 24 hrs  Have you missed any doses of anti-coagulant Eliquis- hasn't missed doses    

## 2020-12-03 ENCOUNTER — Ambulatory Visit (HOSPITAL_COMMUNITY): Payer: Commercial Managed Care - PPO | Admitting: Anesthesiology

## 2020-12-03 ENCOUNTER — Encounter (HOSPITAL_COMMUNITY)
Admission: RE | Disposition: A | Discharge status: Home / Self Care | Payer: Self-pay | Source: Home / Self Care | Attending: Internal Medicine

## 2020-12-03 ENCOUNTER — Ambulatory Visit (HOSPITAL_COMMUNITY)
Admission: RE | Admit: 2020-12-03 | Discharge: 2020-12-03 | Disposition: A | Discharge status: Home / Self Care | Payer: Commercial Managed Care - PPO | Attending: Internal Medicine | Admitting: Internal Medicine

## 2020-12-03 ENCOUNTER — Other Ambulatory Visit: Payer: Self-pay

## 2020-12-03 ENCOUNTER — Encounter (HOSPITAL_COMMUNITY): Payer: Self-pay | Admitting: Internal Medicine

## 2020-12-03 DIAGNOSIS — Z79899 Other long term (current) drug therapy: Secondary | ICD-10-CM | POA: Insufficient documentation

## 2020-12-03 DIAGNOSIS — Z7901 Long term (current) use of anticoagulants: Secondary | ICD-10-CM | POA: Diagnosis not present

## 2020-12-03 DIAGNOSIS — I4819 Other persistent atrial fibrillation: Secondary | ICD-10-CM | POA: Insufficient documentation

## 2020-12-03 HISTORY — PX: ATRIAL FIBRILLATION ABLATION: EP1191

## 2020-12-03 LAB — POCT ACTIVATED CLOTTING TIME
Activated Clotting Time: 260 seconds
Activated Clotting Time: 289 seconds
Activated Clotting Time: 294 seconds

## 2020-12-03 SURGERY — ATRIAL FIBRILLATION ABLATION
Anesthesia: General

## 2020-12-03 MED ORDER — PANTOPRAZOLE SODIUM 40 MG PO TBEC: 40.0000 mg | DELAYED_RELEASE_TABLET | Freq: Every day | ORAL | 0 refills | Status: DC

## 2020-12-03 MED ORDER — APIXABAN 5 MG PO TABS
5.0000 mg | ORAL_TABLET | ORAL | Status: AC
  Administered 2020-12-03: 5 mg via ORAL
  Filled 2020-12-03: qty 1

## 2020-12-03 MED ORDER — PHENYLEPHRINE HCL-NACL 20-0.9 MG/250ML-% IV SOLN
INTRAVENOUS | Status: DC | PRN
  Administered 2020-12-03: 30 ug/min via INTRAVENOUS

## 2020-12-03 MED ORDER — ROCURONIUM BROMIDE 10 MG/ML (PF) SYRINGE
PREFILLED_SYRINGE | INTRAVENOUS | Status: DC | PRN
  Administered 2020-12-03: 70 mg via INTRAVENOUS

## 2020-12-03 MED ORDER — DEXAMETHASONE SODIUM PHOSPHATE 10 MG/ML IJ SOLN
INTRAMUSCULAR | Status: DC | PRN
Start: 2020-12-03 — End: 2020-12-03
  Administered 2020-12-03: 10 mg via INTRAVENOUS

## 2020-12-03 MED ORDER — ONDANSETRON HCL 4 MG/2ML IJ SOLN: 4.0000 mg | Freq: Four times a day (QID) | INTRAMUSCULAR | Status: DC | PRN

## 2020-12-03 MED ORDER — ISOPROTERENOL HCL 0.2 MG/ML IJ SOLN
INTRAVENOUS | Status: DC | PRN
  Administered 2020-12-03: 10 ug/min via INTRAVENOUS

## 2020-12-03 MED ORDER — HEPARIN SODIUM (PORCINE) 1000 UNIT/ML IJ SOLN
INTRAMUSCULAR | Status: AC
  Filled 2020-12-03: qty 2

## 2020-12-03 MED ORDER — ONDANSETRON HCL 4 MG/2ML IJ SOLN
INTRAMUSCULAR | Status: DC | PRN
  Administered 2020-12-03: 4 mg via INTRAVENOUS

## 2020-12-03 MED ORDER — LIDOCAINE HCL (CARDIAC) PF 100 MG/5ML IV SOSY
PREFILLED_SYRINGE | INTRAVENOUS | Status: DC | PRN
  Administered 2020-12-03: 60 mg via INTRATRACHEAL

## 2020-12-03 MED ORDER — PROPOFOL 10 MG/ML IV BOLUS
INTRAVENOUS | Status: DC | PRN
  Administered 2020-12-03: 130 mg via INTRAVENOUS

## 2020-12-03 MED ORDER — HEPARIN (PORCINE) IN NACL 2000-0.9 UNIT/L-% IV SOLN
INTRAVENOUS | Status: DC | PRN
  Administered 2020-12-03: 1000 mL

## 2020-12-03 MED ORDER — HEPARIN SODIUM (PORCINE) 1000 UNIT/ML IJ SOLN
INTRAMUSCULAR | Status: DC | PRN
  Administered 2020-12-03: 15000 [IU] via INTRAVENOUS

## 2020-12-03 MED ORDER — SODIUM CHLORIDE 0.9% FLUSH: 3.0000 mL | Freq: Two times a day (BID) | INTRAVENOUS | Status: DC

## 2020-12-03 MED ORDER — ISOPROTERENOL HCL 0.2 MG/ML IJ SOLN
INTRAMUSCULAR | Status: AC
  Filled 2020-12-03: qty 5

## 2020-12-03 MED ORDER — PHENYLEPHRINE 40 MCG/ML (10ML) SYRINGE FOR IV PUSH (FOR BLOOD PRESSURE SUPPORT)
PREFILLED_SYRINGE | INTRAVENOUS | Status: DC | PRN
  Administered 2020-12-03: 40 ug via INTRAVENOUS

## 2020-12-03 MED ORDER — SODIUM CHLORIDE 0.9 % IV SOLN: 250.0000 mL | INTRAVENOUS | Status: DC | PRN

## 2020-12-03 MED ORDER — PROTAMINE SULFATE 10 MG/ML IV SOLN
INTRAVENOUS | Status: DC | PRN
  Administered 2020-12-03: 40 mg via INTRAVENOUS

## 2020-12-03 MED ORDER — ACETAMINOPHEN 325 MG PO TABS: 650.0000 mg | ORAL_TABLET | ORAL | Status: DC | PRN

## 2020-12-03 MED ORDER — SODIUM CHLORIDE 0.9% FLUSH: 3.0000 mL | INTRAVENOUS | Status: DC | PRN

## 2020-12-03 MED ORDER — HEPARIN (PORCINE) IN NACL 1000-0.9 UT/500ML-% IV SOLN
INTRAVENOUS | Status: AC
  Filled 2020-12-03: qty 500

## 2020-12-03 MED ORDER — SUGAMMADEX SODIUM 200 MG/2ML IV SOLN
INTRAVENOUS | Status: DC | PRN
  Administered 2020-12-03: 200 mg via INTRAVENOUS

## 2020-12-03 MED ORDER — HEPARIN (PORCINE) IN NACL 1000-0.9 UT/500ML-% IV SOLN
INTRAVENOUS | Status: AC
  Filled 2020-12-03: qty 1000

## 2020-12-03 MED ORDER — FENTANYL CITRATE (PF) 100 MCG/2ML IJ SOLN
INTRAMUSCULAR | Status: DC | PRN
  Administered 2020-12-03: 100 ug via INTRAVENOUS
  Administered 2020-12-03: 50 ug via INTRAVENOUS

## 2020-12-03 MED ORDER — HYDROCODONE-ACETAMINOPHEN 5-325 MG PO TABS: 1.0000 | ORAL_TABLET | ORAL | Status: DC | PRN

## 2020-12-03 MED ORDER — HEPARIN (PORCINE) IN NACL 1000-0.9 UT/500ML-% IV SOLN
INTRAVENOUS | Status: DC | PRN
  Administered 2020-12-03 (×3): 500 mL

## 2020-12-03 MED ORDER — SODIUM CHLORIDE 0.9 % IV SOLN: INTRAVENOUS | Status: DC

## 2020-12-03 MED ORDER — HEPARIN SODIUM (PORCINE) 1000 UNIT/ML IJ SOLN
INTRAMUSCULAR | Status: DC | PRN
  Administered 2020-12-03 (×2): 3000 [IU] via INTRAVENOUS
  Administered 2020-12-03: 5000 [IU] via INTRAVENOUS

## 2020-12-03 SURGICAL SUPPLY — 18 items
BLANKET WARM UNDERBOD FULL ACC (MISCELLANEOUS) ×2 IMPLANT
CATH OCTARAY 2.0 F 3-3-3-3-3 (CATHETERS) ×1 IMPLANT
CATH SMTCH THERMOCOOL SF DF (CATHETERS) ×1 IMPLANT
CATH SOUNDSTAR ECO 8FR (CATHETERS) ×1 IMPLANT
CATH WEBSTER BI DIR CS D-F CRV (CATHETERS) ×1 IMPLANT
CLOSURE PERCLOSE PROSTYLE (VASCULAR PRODUCTS) ×3 IMPLANT
COVER SWIFTLINK CONNECTOR (BAG) ×2 IMPLANT
NDL BAYLIS TRANSSEPTAL 71CM (NEEDLE) IMPLANT
NEEDLE BAYLIS TRANSSEPTAL 71CM (NEEDLE) ×2 IMPLANT
PACK EP LATEX FREE (CUSTOM PROCEDURE TRAY) ×2
PACK EP LF (CUSTOM PROCEDURE TRAY) ×1 IMPLANT
PAD PRO RADIOLUCENT 2001M-C (PAD) ×2 IMPLANT
PATCH CARTO3 (PAD) ×1 IMPLANT
SHEATH PINNACLE 7F 10CM (SHEATH) ×2 IMPLANT
SHEATH PINNACLE 9F 10CM (SHEATH) ×1 IMPLANT
SHEATH PROBE COVER 6X72 (BAG) ×1 IMPLANT
SHEATH SWARTZ TS SL2 63CM 8.5F (SHEATH) ×1 IMPLANT
TUBING SMART ABLATE COOLFLOW (TUBING) ×1 IMPLANT

## 2020-12-03 NOTE — Progress Notes (Signed)
Pt ambulated without difficulty or bleeding.   Discharged home with his wife who will drive and stay with pt x 24 hrs. 

## 2020-12-03 NOTE — Anesthesia Preprocedure Evaluation (Addendum)
Anesthesia Evaluation  Patient identified by MRN, date of birth, ID band Patient awake    Reviewed: Allergy & Precautions, NPO status , Patient's Chart, lab work & pertinent test results  History of Anesthesia Complications Negative for: history of anesthetic complications  Airway Mallampati: II  TM Distance: >3 FB Neck ROM: Full    Dental  (+) Dental Advisory Given   Pulmonary sleep apnea ,    breath sounds clear to auscultation       Cardiovascular +CHF  + dysrhythmias Atrial Fibrillation  Rhythm:Regular  1. Global longitudinal strain is -18.4%   COmpared to echo report in Jan 2021, LVEF is improved . Left  ventricular ejection fraction, by estimation, is 60 to 65%. The left  ventricle has normal function. The left ventricle has no regional wall  motion abnormalities. There is mild left  ventricular hypertrophy. Left ventricular diastolic parameters are  consistent with Grade I diastolic dysfunction (impaired relaxation).  2. Right ventricular systolic function is normal. The right ventricular  size is mildly enlarged. There is mildly elevated pulmonary artery  systolic pressure.  3. Left atrial size was severely dilated.  4. Right atrial size was severely dilated.  5. The mitral valve is normal in structure. Trivial mitral valve  regurgitation.  6. The aortic valve is normal in structure. Aortic valve regurgitation is  not visualized.  7. The inferior vena cava is dilated in size with >50% respiratory  variability, suggesting right atrial pressure of 8 mmHg.   ? Nuclear stress EF: 53%. ? There was no ST segment deviation noted during stress. ? The study is normal. ? This is a low risk study. ? The left ventricular ejection fraction is mildly decreased (45-54%).   No evidence of ischemia. EF measures low normal but appears normal visually without significant wall motion abnormalities. Of note, patient did become  hypotensive with regadenoso.    Neuro/Psych negative neurological ROS     GI/Hepatic negative GI ROS, Neg liver ROS,   Endo/Other  negative endocrine ROS  Renal/GU negative Renal ROS     Musculoskeletal negative musculoskeletal ROS (+)   Abdominal   Peds  Hematology eliquis  Lab Results      Component                Value               Date                      WBC                      7.2                 11/12/2020                HGB                      14.9                11/12/2020                HCT                      44.4                11/12/2020                MCV  97                  11/12/2020                PLT                      174                 11/12/2020              Anesthesia Other Findings   Reproductive/Obstetrics                            Anesthesia Physical Anesthesia Plan  ASA: 2  Anesthesia Plan: General   Post-op Pain Management:    Induction:   PONV Risk Score and Plan: 2 and Ondansetron and Dexamethasone  Airway Management Planned: Oral ETT  Additional Equipment: None  Intra-op Plan:   Post-operative Plan: Extubation in OR  Informed Consent: I have reviewed the patients History and Physical, chart, labs and discussed the procedure including the risks, benefits and alternatives for the proposed anesthesia with the patient or authorized representative who has indicated his/her understanding and acceptance.     Dental advisory given  Plan Discussed with: CRNA and Surgeon  Anesthesia Plan Comments:         Anesthesia Quick Evaluation

## 2020-12-03 NOTE — Discharge Instructions (Addendum)
Post procedure care instructions No driving for 4 days. No lifting over 5 lbs for 1 week. No vigorous or sexual activity for 1 week. You may return to work/your usual activities on 12/11/20. Keep procedure site clean & dry. If you notice increased pain, swelling, bleeding or pus, call/return!  You may shower after 24 hours, but no soaking in baths/hot tubs/pools for 1 week.     Cardiac Ablation, Care After  This sheet gives you information about how to care for yourself after your procedure. Your health care provider may also give you more specific instructions. If you have problems or questions, contact your health care provider. What can I expect after the procedure? After the procedure, it is common to have: Bruising around your puncture site. Tenderness around your puncture site. Skipped heartbeats. Tiredness (fatigue).  Follow these instructions at home: Puncture site care  Follow instructions from your health care provider about how to take care of your puncture site. Make sure you: If present, leave stitches (sutures), skin glue, or adhesive strips in place. These skin closures may need to stay in place for up to 2 weeks. If adhesive strip edges start to loosen and curl up, you may trim the loose edges. Do not remove adhesive strips completely unless your health care provider tells you to do that. If a large square bandage is present, this may be removed 24 hours after surgery.  Check your puncture site every day for signs of infection. Check for: Redness, swelling, or pain. Fluid or blood. If your puncture site starts to bleed, lie down on your back, apply firm pressure to the area, and contact your health care provider. Warmth. Pus or a bad smell. Driving Do not drive for at least 4 days after your procedure or however long your health care provider recommends. (Do not resume driving if you have previously been instructed not to drive for other health reasons.) Do not drive or use  heavy machinery while taking prescription pain medicine. Activity Avoid activities that take a lot of effort for at least 7 days after your procedure. Do not lift anything that is heavier than 5 lb (4.5 kg) for one week.  No sexual activity for 1 week.  Return to your normal activities as told by your health care provider. Ask your health care provider what activities are safe for you. General instructions Take over-the-counter and prescription medicines only as told by your health care provider. Do not use any products that contain nicotine or tobacco, such as cigarettes and e-cigarettes. If you need help quitting, ask your health care provider. You may shower after 24 hours, but Do not take baths, swim, or use a hot tub for 1 week.  Do not drink alcohol for 24 hours after your procedure. Keep all follow-up visits as told by your health care provider. This is important. Contact a health care provider if: You have redness, mild swelling, or pain around your puncture site. You have fluid or blood coming from your puncture site that stops after applying firm pressure to the area. Your puncture site feels warm to the touch. You have pus or a bad smell coming from your puncture site. You have a fever. You have chest pain or discomfort that spreads to your neck, jaw, or arm. You are sweating a lot. You feel nauseous. You have a fast or irregular heartbeat. You have shortness of breath. You are dizzy or light-headed and feel the need to lie down. You have pain or  numbness in the arm or leg closest to your puncture site. Get help right away if: Your puncture site suddenly swells. Your puncture site is bleeding and the bleeding does not stop after applying firm pressure to the area. These symptoms may represent a serious problem that is an emergency. Do not wait to see if the symptoms will go away. Get medical help right away. Call your local emergency services (911 in the [place] Do not drive  yourself to the hospital. Summary After the procedure, it is normal to have bruising and tenderness at the puncture site in your groin, neck, or forearm. Check your puncture site every day for signs of infection. Get help right away if your puncture site is bleeding and the bleeding does not stop after applying firm pressure to the area. This is a medical emergency. This information is not intended to replace advice given to you by your health care provider. Make sure you discuss any questions you have with your health care provider.   You have an appointment set up with the Atrial Fibrillation Clinic.  Multiple studies have shown that being followed by a dedicated atrial fibrillation clinic in addition to the standard care you receive from your other physicians improves health. We believe that enrollment in the atrial fibrillation clinic will allow [place] to better care for you.   The phone number to the Atrial Fibrillation Clinic is [phone]. The clinic is staffed Monday through Friday from 8:30am to 5pm.  Parking Directions: The clinic is located in the Heart and Vascular Building connected to [facility]. 1)From [address] turn on to [address] and go to the 3rd entrance  (Heart and Vascular entrance) on the right. 2)Look to the right for Heart &Vascular Parking Garage. 3)A code for the entrance is required, for August is 5544.   4)Take the elevators to the 1st floor. Registration is in the room with the glass walls at the end of the hallway.  If you have any trouble parking or locating the clinic, please don't hesitate to call [phone].

## 2020-12-03 NOTE — Transfer of Care (Signed)
Immediate Anesthesia Transfer of Care Note  Patient: Steven Gray  Procedure(s) Performed: ATRIAL FIBRILLATION ABLATION  Patient Location: Cath Lab  Anesthesia Type:General  Level of Consciousness: awake, alert , oriented and patient cooperative  Airway & Oxygen Therapy: Patient Spontanous Breathing and Patient connected to nasal cannula oxygen  Post-op Assessment: Report given to RN, Post -op Vital signs reviewed and stable and Patient moving all extremities  Post vital signs: Reviewed and stable  Last Vitals:  Vitals Value Taken Time  BP 121/74 12/03/20 1006  Temp 36.6 C 12/03/20 1000  Pulse 51 12/03/20 1007  Resp 11 12/03/20 1007  SpO2 100 % 12/03/20 1007  Vitals shown include unvalidated device data.  Last Pain:  Vitals:   12/03/20 0955  TempSrc:   PainSc: 0-No pain         Complications: No notable events documented.

## 2020-12-03 NOTE — H&P (Signed)
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  Since last being seen in our clinic, the patient reports doing reasonably well.  Unfortunately, he is persistently back in afib.  + fatigue.  He was recently cardioverted and has done better since.  Today, he denies symptoms of palpitations, chest pain, shortness of breath,  lower extremity edema, dizziness, presyncope, or syncope.  The patient is otherwise without complaint today.       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   Persistent atrial fibrillation (HCC) 06/03/2019   Right knee pain 12/07/2016   Sleep apnea      wears CPAP         Past Surgical History:  Procedure Laterality Date   ATRIAL FIBRILLATION ABLATION N/A 05/14/2020    Procedure: ATRIAL FIBRILLATION ABLATION;  Surgeon: Hillis Range, MD;  Location: [facility] INVASIVE CV LAB;  Service: Cardiovascular;  Laterality: N/A;   CARDIOVERSION N/A 07/04/2019    Procedure: CARDIOVERSION;  Surgeon: Quintella Reichert, MD;  Location: [facility] ENDOSCOPY;  Service: Cardiovascular;  Laterality: N/A;   CARDIOVERSION N/A 11/30/2019    Procedure: CARDIOVERSION;  Surgeon: Lewayne Bunting, MD;  Location: [facility] ENDOSCOPY;  Service: Cardiovascular;  Laterality: N/A;   CARDIOVERSION N/A 04/02/2020    Procedure: CARDIOVERSION;  Surgeon: Chilton Si, MD;  Location: [facility] ENDOSCOPY;  Service: Cardiovascular;  Laterality: N/A;   CARDIOVERSION N/A 10/02/2020    Procedure: CARDIOVERSION;  Surgeon: Antonieta Iba, MD;  Location: [facility] ORS;  Service: Cardiovascular;  Laterality: N/A;    MENISCUS REPAIR       TEE WITHOUT CARDIOVERSION N/A 06/05/2019    Procedure: TRANSESOPHAGEAL ECHOCARDIOGRAM (TEE);  Surgeon: Wendall Stade, MD;  Location: [facility] ENDOSCOPY;  Service: Cardiovascular;  Laterality: N/A;   TEE WITHOUT CARDIOVERSION N/A 07/04/2019    Procedure: TRANSESOPHAGEAL ECHOCARDIOGRAM (TEE);  Surgeon: Quintella Reichert, MD;  Location: [facility] ENDOSCOPY;  Service: Cardiovascular;  Laterality: N/A;      ROS- all systems are reviewed and negatives except as per HPI above         Current Outpatient Medications  Medication Sig Dispense Refill   atorvastatin (LIPITOR) 40 MG tablet TAKE 1 TABLET(40 MG) BY MOUTH DAILY AT 6 PM 30 tablet 11   ELIQUIS 5 MG TABS tablet TAKE 1 TABLET(5 MG) BY MOUTH TWICE DAILY 60 tablet 5   losartan (COZAAR) 25 MG tablet TAKE 1 TABLET(25 MG) BY MOUTH AT BEDTIME 30 tablet 11   zolpidem (AMBIEN) 10 MG tablet Take 10 mg by mouth at bedtime as needed for sleep.        No current facility-administered medications for this visit.      Physical Exam: Vitals:   12/03/20 0536  BP: 128/81  Pulse: (!) 50  Temp: 98 F (36.7 C)  SpO2: 98%    GEN- The patient is well appearing, alert and oriented x 3 today.   Head- normocephalic, atraumatic Eyes-  Sclera clear, conjunctiva pink Ears- hearing intact Oropharynx- clear Lungs-  normal work of breathing Heart- irregular  rate and rhythm GI- soft, Extremities- no clubbing, cyanosis, or edema      Wt Readings from Last 3 Encounters:  10/30/20 194 lb (88 kg)  10/02/20 190 lb 14.7 oz (86.6 kg)  09/27/20 191 lb (86.6 kg)         Assessment and Plan:   Persistent afib The patient has symptomatic, recurrent persistent atrial fibrillation post ablation.   Chads2vasc score is 1.  he is anticoagulated with eliquis .   Risk, benefits, and alternatives to EP study and radiofrequency ablation for afib were again discussed in detail today. These risks include but are not limited to stroke, bleeding, vascular damage,  tamponade, perforation, damage to the esophagus, lungs, and other structures, pulmonary vein stenosis, worsening renal function, and death. The patient understands these risk and wishes to proceed.    Cardiac CT reviewed at length with the patient today.  he reports compliance with OAC without interruption.  Hillis Range MD, [facility] 12/03/2020 7:27 AM

## 2020-12-03 NOTE — Anesthesia Procedure Notes (Signed)
Procedure Name: Intubation Date/Time: 12/03/2020 7:45 AM Performed by: Myna Bright, CRNA Pre-anesthesia Checklist: Patient identified, Emergency Drugs available, Suction available and Patient being monitored Patient Re-evaluated:Patient Re-evaluated prior to induction Oxygen Delivery Method: Circle system utilized Preoxygenation: Pre-oxygenation with 100% oxygen Induction Type: IV induction Ventilation: Mask ventilation without difficulty Laryngoscope Size: Mac and 4 Grade View: Grade I Tube type: Oral Tube size: 7.5 mm Number of attempts: 1 Airway Equipment and Method: Stylet Placement Confirmation: ETT inserted through vocal cords under direct vision, positive ETCO2 and breath sounds checked- equal and bilateral Secured at: 23 cm Tube secured with: Tape Dental Injury: Teeth and Oropharynx as per pre-operative assessment

## 2020-12-03 NOTE — Anesthesia Postprocedure Evaluation (Signed)
Anesthesia Post Note  Patient: Steven Gray  Procedure(s) Performed: ATRIAL FIBRILLATION ABLATION     Patient location during evaluation: Cath Lab Anesthesia Type: General Level of consciousness: awake and alert Pain management: pain level controlled Vital Signs Assessment: post-procedure vital signs reviewed and stable Respiratory status: spontaneous breathing, nonlabored ventilation, respiratory function stable and patient connected to nasal cannula oxygen Cardiovascular status: blood pressure returned to baseline and stable Postop Assessment: no apparent nausea or vomiting Anesthetic complications: no   No notable events documented.  Last Vitals:  Vitals:   12/03/20 1230 12/03/20 1300  BP: 125/73 117/74  Pulse: 61 62  Resp: 16 16  Temp:    SpO2: 100% 97%    Last Pain:  Vitals:   12/03/20 1102  TempSrc:   PainSc: 0-No pain                 Maylyn Narvaiz

## 2020-12-11 ENCOUNTER — Ambulatory Visit: Payer: Commercial Managed Care - PPO | Admitting: Internal Medicine

## 2021-01-01 ENCOUNTER — Encounter (HOSPITAL_COMMUNITY): Payer: Self-pay | Admitting: Physician Assistant

## 2021-01-01 ENCOUNTER — Other Ambulatory Visit: Payer: Self-pay

## 2021-01-01 ENCOUNTER — Ambulatory Visit (HOSPITAL_COMMUNITY)
Admission: RE | Admit: 2021-01-01 | Discharge: 2021-01-01 | Disposition: A | Discharge status: Home / Self Care | Payer: Commercial Managed Care - PPO | Source: Ambulatory Visit | Attending: Internal Medicine | Admitting: Internal Medicine

## 2021-01-01 VITALS — BP 122/70 | HR 56 | Ht 73.0 in | Wt 185.8 lb

## 2021-01-01 DIAGNOSIS — G4733 Obstructive sleep apnea (adult) (pediatric): Secondary | ICD-10-CM | POA: Insufficient documentation

## 2021-01-01 DIAGNOSIS — E785 Hyperlipidemia, unspecified: Secondary | ICD-10-CM | POA: Insufficient documentation

## 2021-01-01 DIAGNOSIS — I4819 Other persistent atrial fibrillation: Secondary | ICD-10-CM | POA: Insufficient documentation

## 2021-01-01 DIAGNOSIS — D6869 Other thrombophilia: Secondary | ICD-10-CM | POA: Diagnosis not present

## 2021-01-01 DIAGNOSIS — Z79899 Other long term (current) drug therapy: Secondary | ICD-10-CM | POA: Diagnosis not present

## 2021-01-01 DIAGNOSIS — I5030 Unspecified diastolic (congestive) heart failure: Secondary | ICD-10-CM | POA: Insufficient documentation

## 2021-01-01 DIAGNOSIS — Z7901 Long term (current) use of anticoagulants: Secondary | ICD-10-CM | POA: Insufficient documentation

## 2021-01-01 NOTE — Progress Notes (Signed)
Primary Care Physician: Geoffry Paradise, MD Primary Electrophysiologist: Dr Graciela Husbands Referring Physician: Francis Dowse   Steven Gray is a 70 y.o. male with a history of HLD, OSA, persistent atrial fibrillation, systolic CHF  who presents for follow up in the [facility].  The patient was initially diagnosed with atrial fibrillation 06/03/19 after presenting to the ER with a 76-month onset of worsening dyspnea, orthopnea and PND.  Patient was previously seen by Dr. Excell Seltzer in 2014 for palpitation.  ETT at the time was low risk.  Heart monitor showed sinus rhythm with PAC and PVC, otherwise no significant arrhythmia. On arrival to the ED at [facility] on 06/03/2019, he was noted to be in new atrial fibrillation with RVR with heart rate of 110-120s. Echo showed reduced EF 30-35% with a severely dilated LA. Patient was started on Eliquis for a CHADS2VASC score of 2. Plan was for patient to undergo TEE/DCCV but TEE showed dense smoke and LA thrombus and so DCCV was not attempted. He was discharged on Toprol and digoxin for rate control. Patient underwent TEE guided DCCV on 07/04/19 which was successful. Repeat echo and cardiac MRI showed normalized EF with severe biatrial enlargement. Patient had done well until the week of 11/20/19 when his smart watch again showed afib. He reports good rate control and has been asymptomatic. Patient is s/p DCCV on 11/30/19. Patient is s/p afib ablation with Dr Johney Frame on 05/14/20 but unfortunately had recurrent persistent afib. He had a DCCV on 11/06/20 and underwent repeat ablation on 12/03/20.  On follow up today, patient reports that he has done well since the ablation with no episodes of afib. His Lourena Simmonds has shown only SR. He denies CP, swallowing pain, or groin issues. He denies any bleeding issues on anticoagulation.  Today, he denies symptoms of palpitations, chest pain, orthopnea, PND, lower extremity edema, dizziness, presyncope,  syncope, bleeding, or neurologic sequela. The patient is tolerating medications without difficulties and is otherwise without complaint today.    Atrial Fibrillation Risk Factors:  he does have symptoms or diagnosis of sleep apnea. he is compliant with CPAP therapy. he does not have a history of rheumatic fever. he does have a history of alcohol use. The patient does not have a history of early familial atrial fibrillation or other arrhythmias.  he has a BMI of Body mass index is 24.51 kg/m.Marland Kitchen Filed Weights   01/01/21 0830  Weight: 84.3 kg     Family History  Problem Relation Age of Onset   Polycystic kidney disease Mother    Vascular Disease Father        vascular dementia   CAD Maternal Grandfather    Atrial fibrillation Neg Hx      Atrial Fibrillation Management history:  Previous antiarrhythmic drugs: none Previous cardioversions: 07/04/19, 11/30/19, 11/06/20 Previous ablations: 05/14/20, 12/03/20 CHADS2VASC score: 2 Anticoagulation history: Eliquis   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   Persistent atrial fibrillation (HCC) 06/03/2019   Right knee pain 12/07/2016   Sleep apnea    wears CPAP   Past Surgical History:  Procedure Laterality Date   ATRIAL FIBRILLATION ABLATION N/A 05/14/2020  Procedure: ATRIAL FIBRILLATION ABLATION;  Surgeon: Hillis Range, MD;  [facility]: [facility];  Service: Cardiovascular;  Laterality: N/A;   ATRIAL FIBRILLATION ABLATION N/A 12/03/2020   Procedure: ATRIAL FIBRILLATION ABLATION;  Surgeon: Hillis Range, MD;  Location: [facility]VAS[facility] CV LAB;  Service: Cardiovascular;  Laterality: N/A;   CARDIOVERSION N/A 07/04/2019    Procedure: CARDIOVERSION;  Surgeon: Quintella Reichert, MD;  Location: [facility] ENDOSCOPY;  Service: Cardiovascular;  Laterality: N/A;   CARDIOVERSION N/A 11/30/2019   Procedure: CARDIOVERSION;  Surgeon: Lewayne Bunting, MD;  Location: [facility] ENDOSCOPY;  Service: Cardiovascular;  Laterality: N/A;   CARDIOVERSION N/A 04/02/2020   Procedure: CARDIOVERSION;  Surgeon: Chilton Si, MD;  Location: [facility] ENDOSCOPY;  Service: Cardiovascular;  Laterality: N/A;   CARDIOVERSION N/A 10/02/2020   Procedure: CARDIOVERSION;  Surgeon: Antonieta Iba, MD;  Location: [facility] ORS;  Service: Cardiovascular;  Laterality: N/A;   CARDIOVERSION N/A 11/06/2020   Procedure: CARDIOVERSION;  Surgeon: [name], MD;  Location: [facility] ENDOSCOPY;  Service: Cardiovascular;  Laterality: N/A;   MENISCUS REPAIR     TEE WITHOUT CARDIOVERSION N/A 06/05/2019   Procedure: TRANSESOPHAGEAL ECHOCARDIOGRAM (TEE);  Surgeon: Wendall Stade, MD;  Location: [facility] ENDOSCOPY;  Service: Cardiovascular;  Laterality: N/A;   TEE WITHOUT CARDIOVERSION N/A 07/04/2019   Procedure: TRANSESOPHAGEAL ECHOCARDIOGRAM (TEE);  Surgeon: Quintella Reichert, MD;  Location: [facility] ENDOSCOPY;  Service: Cardiovascular;  Laterality: N/A;    Current Outpatient Medications  Medication Sig Dispense Refill   acetaminophen (TYLENOL) 500 MG tablet Take 500-1,000 mg by mouth every 6 (six) hours as needed (pain.).     atorvastatin (LIPITOR) 40 MG tablet TAKE 1 TABLET(40 MG) BY MOUTH DAILY AT 6 PM 30 tablet 11   ELIQUIS 5 MG TABS tablet TAKE 1 TABLET(5 MG) BY MOUTH TWICE DAILY 60 tablet 5   losartan (COZAAR) 25 MG tablet TAKE 1 TABLET(25 MG) BY MOUTH AT BEDTIME 30 tablet 11   zolpidem (AMBIEN) 10 MG tablet Take 2.5-5 mg by mouth at bedtime as needed for sleep.     No current facility-administered medications for this encounter.    No Known Allergies  Social History   Socioeconomic History   Marital status: Married    Spouse name: Danella Sensing    Number of children: 2   Years of  education: Not on file   Highest education level: Not on file  Occupational History   Occupation: warehousing  Tobacco Use   Smoking status: Never   Smokeless tobacco: Never  Substance and Sexual Activity   Alcohol use: Not Currently    Comment: he reports a couple glasses of wine   Drug use: No   Sexual activity: Not on file  Other Topics Concern   Not on file  Social History Narrative   Married with 2 sons, nonsmoker, social ETOH, works as Psychologist, educational in Geologist, engineering. Frequent exercise.   Social Determinants of Health   Financial Resource Strain: Not on file  Food Insecurity: Not on file  Transportation Needs: Not on file  Physical Activity: Not on file  Stress: Not on file  Social Connections: Not on file  Intimate Partner Violence: Not on file     ROS- All systems are reviewed and negative except as per the HPI above.  Physical Exam: Vitals:   01/01/21 0830  BP: 122/70  Pulse: (!) 56  Weight: 84.3 kg  Height: 6\' 1"  (1.854 m)    GEN- The patient is a well appearing male, alert and oriented x 3 today.  HEENT-head normocephalic, atraumatic, sclera clear, conjunctiva pink, hearing intact, trachea midline. Lungs- Clear to ausculation bilaterally, normal work of breathing Heart- Regular rate and rhythm, no murmurs, rubs or gallops  GI- soft, NT, ND, + BS Extremities- no clubbing, cyanosis, or edema MS- no significant deformity or atrophy Skin- no rash or lesion Psych- euthymic mood, full affect Neuro- strength and sensation are intact   Wt Readings from Last 3 Encounters:  01/01/21 84.3 kg  12/03/20 83.9 kg  11/06/20 88 kg    EKG today demonstrates  SB Vent. rate 56 BPM PR interval 158 ms QRS duration 88 ms QT/QTcB 456/440 ms  Echo 08/28/19 demonstrated  1. Global longitudinal strain is -18.4%      COmpared to echo report in Jan 2021, LVEF is improved . Left  ventricular ejection fraction, by estimation, is 60 to 65%. The left  ventricle has  normal function. The left ventricle has no regional wall  motion abnormalities. There is mild left  ventricular hypertrophy. Left ventricular diastolic parameters are  consistent with Grade I diastolic dysfunction (impaired relaxation).   2. Right ventricular systolic function is normal. The right ventricular  size is mildly enlarged. There is mildly elevated pulmonary artery  systolic pressure.   3. Left atrial size was severely dilated.   4. Right atrial size was severely dilated.   5. The mitral valve is normal in structure. Trivial mitral valve  regurgitation.   6. The aortic valve is normal in structure. Aortic valve regurgitation is  not visualized.   7. The inferior vena cava is dilated in size with >50% respiratory  variability, suggesting right atrial pressure of 8 mmHg.   Epic records are reviewed at length today  CHA2DS2-VASc Score = 2  The patient's score is based upon: CHF History: Yes HTN History: No Diabetes History: No Stroke History: No Vascular Disease History: No Age Score: 1 Gender Score: 0      ASSESSMENT AND PLAN: 1. Persistent Atrial Fibrillation (ICD10:  I48.19) The patient's CHA2DS2-VASc score is 2, indicating a 2.2% annual risk of stroke.   S/p afib ablation 05/14/20 with repeat ablation 12/03/20 Patient appears to be maintaining SR. Continue Eliquis 5 mg BID with no missed doses for 3 months post ablation.  Continue Toprol 25 mg daily Kardia for home monitoring.   2. Secondary Hypercoagulable State (ICD10:  D68.69) The patient is at significant risk for stroke/thromboembolism based upon his CHA2DS2-VASc Score of 2.  Continue Apixaban (Eliquis).     3. Obstructive sleep apnea Patient reports compliance with CPAP therapy.  4. Systolic dysfunction Resolved with SR. No signs or symptoms of fluid overload.   Follow up with Dr Johney Frame as scheduled.    Jorja Loa PA-C Afib Clinic [facility] [address] [place], [place]  [address] [phone] 01/01/2021 8:53 AM

## 2021-02-24 ENCOUNTER — Other Ambulatory Visit: Payer: Self-pay

## 2021-02-24 MED ORDER — APIXABAN 5 MG PO TABS
ORAL_TABLET | ORAL | 5 refills | Status: DC
Start: 2021-02-24 — End: 2021-03-11

## 2021-02-24 NOTE — Telephone Encounter (Signed)
Prescription refill request for Eliquis received. Indication: Afib  Last office visit:01/01/21 ([place]) Scr: 1.32 (11/12/20) Age: 70 Weight: 84.3kg  Appropriate dose and refill sent to requested pharmacy.

## 2021-03-11 ENCOUNTER — Telehealth: Payer: Self-pay | Admitting: Internal Medicine

## 2021-03-11 MED ORDER — APIXABAN 5 MG PO TABS: ORAL_TABLET | ORAL | 1 refills | Status: DC

## 2021-03-11 NOTE — Telephone Encounter (Signed)
*  STAT* If patient is at the pharmacy, call can be transferred to refill team.   1. Which medications need to be refilled? (please list name of each medication and dose if known)  apixaban (ELIQUIS) 5 MG TABS tablet  2. Which pharmacy/location (including street and city if local pharmacy) is medication to be sent to? [id number] - [place], [place] - [address] AT [facility] [address]  3. Do they need a 30 day or 90 day supply?  90 day supply    Pharmacy advised he needed to request a refill

## 2021-03-11 NOTE — Telephone Encounter (Signed)
Eliquis 5mg  refill request received. Patient is 70 years old, weight-84.3kg, Crea-1.32 on 11/12/2020, Diagnosis-Afib, and last seen by 01/13/2021, PA on 01/01/2021 & Dr. 01/03/2021 on 10/30/2020. Dose is appropriate based on dosing criteria. Will send in refill to requested pharmacy.

## 2021-03-21 ENCOUNTER — Other Ambulatory Visit: Payer: Self-pay

## 2021-03-21 ENCOUNTER — Encounter: Payer: Self-pay | Admitting: Internal Medicine

## 2021-03-21 ENCOUNTER — Ambulatory Visit: Payer: Commercial Managed Care - PPO | Admitting: Internal Medicine

## 2021-03-21 VITALS — BP 124/74 | HR 59 | Ht 73.0 in | Wt 184.4 lb

## 2021-03-21 DIAGNOSIS — G4733 Obstructive sleep apnea (adult) (pediatric): Secondary | ICD-10-CM

## 2021-03-21 DIAGNOSIS — I428 Other cardiomyopathies: Secondary | ICD-10-CM

## 2021-03-21 DIAGNOSIS — I4819 Other persistent atrial fibrillation: Secondary | ICD-10-CM | POA: Diagnosis not present

## 2021-03-21 NOTE — Progress Notes (Signed)
PCP: Geoffry Paradise, MD Primary Cardiologist: Dr Catha Nottingham is a 70 y.o. male who presents today for routine electrophysiology followup.  Since his recent afib ablation, the patient reports doing very well.  he denies procedure related complications and is pleased with the results of the procedure.  Today, he denies symptoms of palpitations, chest pain, shortness of breath,  lower extremity edema, dizziness, presyncope, or syncope.  The patient is otherwise without complaint today.   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   Persistent atrial fibrillation (HCC) 06/03/2019   Right knee pain 12/07/2016   Sleep apnea    wears CPAP   Past Surgical History:  Procedure Laterality Date   ATRIAL FIBRILLATION ABLATION N/A 05/14/2020   Procedure: ATRIAL FIBRILLATION ABLATION;  Surgeon: Hillis Range, MD;  Location: [facility];  Service: Cardiovascular;  Laterality: N/A;   ATRIAL FIBRILLATION ABLATION N/A 12/03/2020   Procedure: ATRIAL FIBRILLATION ABLATION;  Surgeon: Hillis Range, MD;  Location: [facility] LAB;  Service: Cardiovascular;  Laterality: N/A;   CARDIOVERSION N/A 07/04/2019   Procedure: CARDIOVERSION;  Surgeon: Quintella Reichert, MD;  Location: [facility] ENDOSCOPY;  Service: Cardiovascular;  Laterality: N/A;   CARDIOVERSION N/A 11/30/2019   Procedure: CARDIOVERSION;  Surgeon: Lewayne Bunting, MD;  Location: [facility] ENDOSCOPY;  Service: Cardiovascular;  Laterality: N/A;   CARDIOVERSION N/A 04/02/2020   Procedure: CARDIOVERSION;  Surgeon: Chilton Si, MD;  Location: [facility] ENDOSCOPY;  Service: Cardiovascular;  Laterality: N/A;   CARDIOVERSION N/A 10/02/2020   Procedure:  CARDIOVERSION;  Surgeon: Antonieta Iba, MD;  Location: [facility] ORS;  Service: Cardiovascular;  Laterality: N/A;   CARDIOVERSION N/A 11/06/2020   Procedure: CARDIOVERSION;  Surgeon: [name], MD;  Location: [facility] ENDOSCOPY;  Service: Cardiovascular;  Laterality: N/A;   MENISCUS REPAIR     TEE WITHOUT CARDIOVERSION N/A 06/05/2019   Procedure: TRANSESOPHAGEAL ECHOCARDIOGRAM (TEE);  Surgeon: Wendall Stade, MD;  Location: [facility] ENDOSCOPY;  Service: Cardiovascular;  Laterality: N/A;   TEE WITHOUT CARDIOVERSION N/A 07/04/2019   Procedure: TRANSESOPHAGEAL ECHOCARDIOGRAM (TEE);  Surgeon: Quintella Reichert, MD;  Location: [facility] ENDOSCOPY;  Service: Cardiovascular;  Laterality: N/A;    ROS- all systems are personally reviewed and negatives except as per HPI above  Current Outpatient Medications  Medication Sig Dispense Refill   acetaminophen (TYLENOL) 500 MG tablet Take 500-1,000 mg by mouth every 6 (six) hours as needed (pain.).     apixaban (ELIQUIS) 5 MG TABS tablet TAKE 1 TABLET(5 MG) BY MOUTH TWICE DAILY 180 tablet 1   atorvastatin (LIPITOR) 40 MG tablet TAKE 1 TABLET(40 MG) BY MOUTH DAILY AT 6 PM 30 tablet 11   losartan (COZAAR) 25 MG tablet TAKE 1 TABLET(25 MG) BY MOUTH AT BEDTIME 30 tablet 11   zolpidem (AMBIEN) 10 MG tablet Take 2.5-5 mg by mouth at bedtime as needed for sleep.     No current facility-administered medications for this visit.    Physical Exam: Vitals:   03/21/21 0820  BP: 124/74  Pulse: (!) 59  SpO2: 97%  Weight: 184 lb 6.4 oz (83.6 kg)  Height: 6\' 1"  (1.854 m)    GEN- The patient is well  appearing, alert and oriented x 3 today.   Head- normocephalic, atraumatic Eyes-  Sclera clear, conjunctiva pink Ears- hearing intact Oropharynx- clear Lungs- Clear to ausculation bilaterally, normal work of breathing Heart- Regular rate and rhythm, no murmurs, rubs or gallops, PMI not laterally displaced GI- soft, NT, ND, + BS Extremities- no clubbing, cyanosis, or  edema  EKG tracing ordered today is personally reviewed and shows sinus  Assessment and Plan:  1. Persistent atrial fibrillation Doing well s/p ablation chads2vasc score is 1.  He is on eliquis We discussed pros and cons/ risks/ benefits of [place] at length today.  We also discussed AVERROES data and React AF trial design at length.  For now he would like to continue eliquis.  He will discuss with Dr Caryl Comes on return  2. Tachycardia mediated CM Resolved  3. OSA Compliance with therapy is advised   Return to see Dr Caryl Comes in 3 months  Thompson Grayer MD, [facility] 03/21/2021 8:54 AM

## 2021-03-21 NOTE — Patient Instructions (Addendum)
Medication Instructions:  Your physician recommends that you continue on your current medications as directed. Please refer to the Current Medication list given to you today. *If you need a refill on your cardiac medications before your next appointment, please call your pharmacy*  Lab Work: None. If you have labs (blood work) drawn today and your tests are completely normal, you will receive your results only by: MyChart Message (if you have MyChart) OR A paper copy in the mail If you have any lab test that is abnormal or we need to change your treatment, we will call you to review the results.  Testing/Procedures: None.  Follow-Up: At [facility], you and your health needs are our priority.  As part of our continuing mission to provide you with exceptional heart care, we have created designated Provider Care Teams.  These Care Teams include your primary Cardiologist (physician) and Advanced Practice Providers (APPs -  Physician Assistants and Nurse Practitioners) who all work together to provide you with the care you need, when you need it.  Your physician wants you to follow-up in: 3 months with Dr. Graciela Husbands.   We recommend signing up for the patient portal called "MyChart".  Sign up information is provided on this After Visit Summary.  MyChart is used to connect with patients for Virtual Visits (Telemedicine).  Patients are able to view lab/test results, encounter notes, upcoming appointments, etc.  Non-urgent messages can be sent to your provider as well.   To learn more about what you can do with MyChart, go to [email or web address].    Any Other Special Instructions Will Be Listed Below (If Applicable).

## 2021-03-24 ENCOUNTER — Telehealth (HOSPITAL_COMMUNITY): Payer: Self-pay | Admitting: *Deleted

## 2021-03-24 NOTE — Telephone Encounter (Signed)
Patient called in stating he went into afib yesterday. He restarted metoprolol yesterday but HRs are 70-100 BP 121/68. Feels jittery but otherwise ok. Will plan to bring in tomorrow if still out of rhythm. Pt verbalized agreement.

## 2021-03-25 ENCOUNTER — Other Ambulatory Visit: Payer: Self-pay

## 2021-03-25 ENCOUNTER — Ambulatory Visit (HOSPITAL_COMMUNITY)
Admission: RE | Admit: 2021-03-25 | Discharge: 2021-03-25 | Disposition: A | Discharge status: Home / Self Care | Payer: Commercial Managed Care - PPO | Source: Ambulatory Visit | Attending: Physician Assistant | Admitting: Physician Assistant

## 2021-03-25 ENCOUNTER — Encounter (HOSPITAL_COMMUNITY): Payer: Self-pay | Admitting: Physician Assistant

## 2021-03-25 VITALS — BP 106/88 | HR 88 | Ht 73.0 in | Wt 185.8 lb

## 2021-03-25 DIAGNOSIS — I4819 Other persistent atrial fibrillation: Secondary | ICD-10-CM | POA: Diagnosis present

## 2021-03-25 DIAGNOSIS — Z7901 Long term (current) use of anticoagulants: Secondary | ICD-10-CM | POA: Diagnosis not present

## 2021-03-25 DIAGNOSIS — E785 Hyperlipidemia, unspecified: Secondary | ICD-10-CM | POA: Insufficient documentation

## 2021-03-25 DIAGNOSIS — Z9989 Dependence on other enabling machines and devices: Secondary | ICD-10-CM | POA: Diagnosis not present

## 2021-03-25 DIAGNOSIS — G4733 Obstructive sleep apnea (adult) (pediatric): Secondary | ICD-10-CM | POA: Diagnosis not present

## 2021-03-25 DIAGNOSIS — Z79899 Other long term (current) drug therapy: Secondary | ICD-10-CM | POA: Insufficient documentation

## 2021-03-25 DIAGNOSIS — D6869 Other thrombophilia: Secondary | ICD-10-CM | POA: Insufficient documentation

## 2021-03-25 DIAGNOSIS — I5020 Unspecified systolic (congestive) heart failure: Secondary | ICD-10-CM | POA: Diagnosis not present

## 2021-03-25 LAB — BASIC METABOLIC PANEL
Anion gap: 6 (ref 5–15)
BUN: 21 mg/dL (ref 8–23)
CO2: 27 mmol/L (ref 22–32)
Calcium: 9.1 mg/dL (ref 8.9–10.3)
Chloride: 104 mmol/L (ref 98–111)
Creatinine, Ser: 1.28 mg/dL — ABNORMAL HIGH (ref 0.61–1.24)
GFR, Estimated: >60 mL/min (ref >60)
Glucose, Bld: 122 mg/dL — ABNORMAL HIGH (ref 70–99)
Potassium: 4.4 mmol/L (ref 3.5–5.1)
Sodium: 137 mmol/L (ref 135–145)

## 2021-03-25 LAB — CBC
HCT: 44.3 % (ref 39.0–52.0)
Hemoglobin: 15.2 g/dL (ref 13.0–17.0)
MCH: 34.5 pg — ABNORMAL HIGH (ref 26.0–34.0)
MCHC: 34.3 g/dL (ref 30.0–36.0)
MCV: 100.7 fL — ABNORMAL HIGH (ref 80.0–100.0)
Platelets: 189 10*3/uL (ref 150–400)
RBC: 4.4 MIL/uL (ref 4.22–5.81)
RDW: 11.8 % (ref 11.5–15.5)
WBC: 7.9 10*3/uL (ref 4.0–10.5)
nRBC: 0 % (ref 0.0–0.2)

## 2021-03-25 NOTE — Progress Notes (Signed)
Primary Care Physician: Burnard Bunting, MD Primary Electrophysiologist: Dr Caryl Comes Referring Physician: Tommye Standard   Shakeim Batt Steven Gray is a 70 y.o. male with a history of HLD, OSA, persistent atrial fibrillation, systolic CHF  who presents for follow up in the [facility].  The patient was initially diagnosed with atrial fibrillation 06/03/19 after presenting to the ER with a 75-month onset of worsening dyspnea, orthopnea and PND.  Patient was previously seen by Dr. Burt Knack in 2014 for palpitation.  ETT at the time was low risk.  Heart monitor showed sinus rhythm with PAC and PVC, otherwise no significant arrhythmia. On arrival to the ED at [facility] on 06/03/2019, he was noted to be in new atrial fibrillation with RVR with heart rate of 110-120s. Echo showed reduced EF 30-35% with a severely dilated LA. Patient was started on Eliquis for a CHADS2VASC score of 2. Plan was for patient to undergo TEE/DCCV but TEE showed dense smoke and LA thrombus and so DCCV was not attempted. He was discharged on Toprol and digoxin for rate control. Patient underwent TEE guided DCCV on 07/04/19 which was successful. Repeat echo and cardiac MRI showed normalized EF with severe biatrial enlargement. Patient had done well until the week of 11/20/19 when his smart watch again showed afib. He reports good rate control and has been asymptomatic. Patient is s/p DCCV on 11/30/19. Patient is s/p afib ablation with Dr Rayann Heman on 05/14/20 but unfortunately had recurrent persistent afib. He had a DCCV on 11/06/20 and underwent repeat ablation on 12/03/20.  On follow up today, patient reports that on 03/23/21 he felt he was back in afib feeling "jittery". ECG confirms rate controlled afib. No specific triggers that he could identify.   Today, he denies symptoms of chest pain, orthopnea, PND, lower extremity edema, dizziness, presyncope, syncope, bleeding, or neurologic sequela. The patient is tolerating  medications without difficulties and is otherwise without complaint today.    Atrial Fibrillation Risk Factors:  he does have symptoms or diagnosis of sleep apnea. he is compliant with CPAP therapy. he does not have a history of rheumatic fever. he does have a history of alcohol use. The patient does not have a history of early familial atrial fibrillation or other arrhythmias.  he has a BMI of Body mass index is 24.51 kg/m.Marland Kitchen Filed Weights   03/25/21 1132  Weight: 84.3 kg      Family History  Problem Relation Age of Onset   Polycystic kidney disease Mother    Vascular Disease Father        vascular dementia   CAD Maternal Grandfather    Atrial fibrillation Neg Hx      Atrial Fibrillation Management history:  Previous antiarrhythmic drugs: none Previous cardioversions: 07/04/19, 11/30/19, 11/06/20 Previous ablations: 05/14/20, 12/03/20 CHADS2VASC score: 2 Anticoagulation history: Eliquis   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   Hyperlipidemia    does not take medication for this, denies   Increased prostate specific antigen (PSA) velocity    Left atrial enlargement    Nonischemic cardiomyopathy ([place]) 06/03/2019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   Persistent atrial fibrillation ([place]) 06/03/2019   Right knee pain 12/07/2016   Sleep apnea    wears CPAP   Past Surgical History:  Procedure Laterality Date   ATRIAL FIBRILLATION ABLATION N/A 05/14/2020   Procedure: ATRIAL FIBRILLATION ABLATION;  Surgeon: Rayann Heman,  Jeneen Rinks, MD;  Location: [place] [facility];  Service: Cardiovascular;  Laterality: N/A;   ATRIAL FIBRILLATION ABLATION N/A 12/03/2020   Procedure: ATRIAL FIBRILLATION ABLATION;  Surgeon: Thompson Grayer, MD;  Location: [place] [facility];  Service: Cardiovascular;  Laterality: N/A;   CARDIOVERSION N/A 07/04/2019   Procedure: CARDIOVERSION;  Surgeon: Sueanne Margarita, MD;  Location: [place];  Service: Cardiovascular;  Laterality: N/A;   CARDIOVERSION N/A 11/30/2019   Procedure: CARDIOVERSION;  Surgeon: Lelon Perla, MD;  Location: [facility] ENDOSCOPY;  Service: Cardiovascular;  Laterality: N/A;   CARDIOVERSION N/A 04/02/2020   Procedure: CARDIOVERSION;  Surgeon: Skeet Latch, MD;  Location: [place];  Service: Cardiovascular;  Laterality: N/A;   CARDIOVERSION N/A 10/02/2020   Procedure: CARDIOVERSION;  Surgeon: Minna Merritts, MD;  Location: [facility];  Service: Cardiovascular;  Laterality: N/A;   CARDIOVERSION N/A 11/06/2020   Procedure: CARDIOVERSION;  Surgeon: Donato Heinz, MD;  Location: [place];  Service: Cardiovascular;  Laterality: N/A;   MENISCUS REPAIR     TEE WITHOUT CARDIOVERSION N/A 06/05/2019   Procedure: TRANSESOPHAGEAL ECHOCARDIOGRAM (TEE);  Surgeon: Josue Hector, MD;  Location: [facility] ENDOSCOPY;  Service: Cardiovascular;  Laterality: N/A;   TEE WITHOUT CARDIOVERSION N/A 07/04/2019   Procedure: TRANSESOPHAGEAL ECHOCARDIOGRAM (TEE);  Surgeon: Sueanne Margarita, MD;  Location: [facility] ENDOSCOPY;  Service: Cardiovascular;  Laterality: N/A;    Current Outpatient Medications  Medication Sig Dispense Refill   acetaminophen (TYLENOL) 500 MG tablet Take 500-1,000 mg by mouth every 6 (six) hours as needed (pain.).     apixaban (ELIQUIS) 5 MG TABS tablet TAKE 1 TABLET(5 MG) BY MOUTH TWICE DAILY 180 tablet 1   atorvastatin (LIPITOR) 40 MG tablet TAKE 1 TABLET(40 MG) BY MOUTH DAILY AT 6 PM 30 tablet 11   losartan (COZAAR) 25 MG tablet TAKE 1 TABLET(25 MG) BY MOUTH AT BEDTIME 30 tablet 11   zolpidem (AMBIEN) 10 MG tablet Take 2.5-5 mg by mouth at bedtime as needed for sleep.     No current facility-administered medications for this encounter.    No Known Allergies  Social History   Socioeconomic History   Marital status: Married    Spouse name: Massie Maroon    Number of children: 2   Years of education: Not on file   Highest education level: Not on file   Occupational History   Occupation: warehousing  Tobacco Use   Smoking status: Never   Smokeless tobacco: Never  Substance and Sexual Activity   Alcohol use: Not Currently    Comment: he reports a couple glasses of wine   Drug use: No   Sexual activity: Not on file  Other Topics Concern   Not on file  Social History Narrative   Married with 2 sons, nonsmoker, social ETOH, works as Programme researcher, broadcasting/film/video in [employer], prison and probation services. Frequent exercise.   Social Determinants of Health   Financial Resource Strain: Not on file  Food Insecurity: Not on file  Transportation Needs: Not on file  Physical Activity: Not on file  Stress: Not on file  Social Connections: Not on file  Intimate Partner Violence: Not on file     ROS- All systems are reviewed and negative except as per the HPI above.  Physical Exam: Vitals:   03/25/21 1132  BP: 106/88  Pulse: 88  Weight: 84.3 kg  Height: 6\' 1"  (1.854 m)    GEN- The patient is a well appearing male, alert and oriented x 3 today.   HEENT-head normocephalic, atraumatic, sclera clear, conjunctiva pink,  hearing intact, trachea midline. Lungs- Clear to ausculation bilaterally, normal work of breathing Heart- irregular rate and rhythm, no murmurs, rubs or gallops  GI- soft, NT, ND, + BS Extremities- no clubbing, cyanosis, or edema MS- no significant deformity or atrophy Skin- no rash or lesion Psych- euthymic mood, full affect Neuro- strength and sensation are intact   Wt Readings from Last 3 Encounters:  03/25/21 84.3 kg  03/21/21 83.6 kg  01/01/21 84.3 kg    EKG today demonstrates  Afib Vent. rate 98 BPM PR interval * ms QRS duration 88 ms QT/QTcB 360/459 ms  Echo 08/28/19 demonstrated  1. Global longitudinal strain is -18.4%      COmpared to echo report in Jan 2021, LVEF is improved . Left  ventricular ejection fraction, by estimation, is 60 to 65%. The left  ventricle has normal function. The left ventricle has no regional wall  motion  abnormalities. There is mild left ventricular hypertrophy. Left ventricular diastolic parameters are consistent with Grade I diastolic dysfunction (impaired relaxation).   2. Right ventricular systolic function is normal. The right ventricular  size is mildly enlarged. There is mildly elevated pulmonary artery  systolic pressure.   3. Left atrial size was severely dilated.   4. Right atrial size was severely dilated.   5. The mitral valve is normal in structure. Trivial mitral valve  regurgitation.   6. The aortic valve is normal in structure. Aortic valve regurgitation is  not visualized.   7. The inferior vena cava is dilated in size with >50% respiratory  variability, suggesting right atrial pressure of 8 mmHg.   Epic records are reviewed at length today  CHA2DS2-VASc Score = 2  The patient's score is based upon: CHF History: 1 HTN History: 0 Diabetes History: 0 Stroke History: 0 Vascular Disease History: 0 Age Score: 1 Gender Score: 0      ASSESSMENT AND PLAN: 1. Persistent Atrial Fibrillation (ICD10:  I48.19) The patient's CHA2DS2-VASc score is 2, indicating a 2.2% annual risk of stroke.   S/p afib ablation 05/14/20 with repeat ablation 12/03/20 We discussed therapeutic options today including AAD (dofetilide) vs DCCV alone. Patient would really like to avoid medication if possible. Will plan for DCCV alone for now but if he has early recurrence, will need additional rhythm control. He does have severe biatrial enlargement. Per Dr Jenel Lucks note could also consider referral to Dr Herbert Deaner for convergent.  Continue Eliquis 5 mg BID Continue Toprol 25 mg daily Kardia for home monitoring.   2. Secondary Hypercoagulable State (ICD10:  D68.69) The patient is at significant risk for stroke/thromboembolism based upon his CHA2DS2-VASc Score of 2.  Continue Apixaban (Eliquis).     3. Obstructive sleep apnea Patient reports compliance with CPAP therapy.  4. Systolic dysfunction Resolved  with SR. No signs or symptoms of fluid overload.   Follow up in the AF clinic post DCCV.    Jorja Loa PA-C Af[facility] [address] [place], [place] [address] [phone] 03/25/2021 1:25 PM

## 2021-03-25 NOTE — Patient Instructions (Signed)
Cardioversion scheduled for Friday, December 2nd  - Arrive at the [facility] and go to admitting at 12PM  - Do not eat or drink anything after midnight the night prior to your procedure.  - Take all your morning medication (except diabetic medications) with a sip of water prior to arrival.  - You will not be able to drive home after your procedure.  - Do NOT miss any doses of your blood thinner - if you should miss a dose please notify our office immediately.  - If you feel as if you go back into normal rhythm prior to scheduled cardioversion, please notify our office immediately. If your procedure is canceled in the cardioversion suite you will be charged a cancellation fee. Patients will be asked to: to mask in public and hand hygiene (no longer quarantine) in the 3 days prior to surgery, to report if any COVID-19-like illness or household contacts to COVID-19 to determine need for testing

## 2021-03-26 ENCOUNTER — Encounter (HOSPITAL_COMMUNITY): Payer: Self-pay | Admitting: Cardiology

## 2021-03-26 NOTE — Progress Notes (Signed)
Attempted to obtain medical history via telephone, unable to reach at this time. I left a voicemail to return pre surgical testing department's phone call.  

## 2021-03-30 ENCOUNTER — Telehealth: Payer: Self-pay | Admitting: Cardiology

## 2021-03-30 NOTE — Telephone Encounter (Signed)
Pt called for HR 125 in his A fib usually 80-90s - he is asymptomatic.  No colds or fevers.  He has been taking toprol XL 25 mg but no changes in HR, I asked him to take 50 mg today then 25 mg daily.  I will send to A fib clinic as well. Pt for DCCV on 04/04/21

## 2021-04-04 ENCOUNTER — Ambulatory Visit (HOSPITAL_COMMUNITY)
Admission: RE | Admit: 2021-04-04 | Payer: Commercial Managed Care - PPO | Source: Home / Self Care | Admitting: Cardiology

## 2021-04-04 ENCOUNTER — Encounter (HOSPITAL_COMMUNITY): Payer: Self-pay | Admitting: Anesthesiology

## 2021-04-04 ENCOUNTER — Ambulatory Visit (HOSPITAL_COMMUNITY)
Admission: RE | Admit: 2021-04-04 | Discharge: 2021-04-04 | Disposition: A | Discharge status: Home / Self Care | Payer: Commercial Managed Care - PPO | Source: Ambulatory Visit | Attending: Physician Assistant | Admitting: Physician Assistant

## 2021-04-04 ENCOUNTER — Encounter (HOSPITAL_COMMUNITY): Admission: RE | Payer: Self-pay | Source: Home / Self Care

## 2021-04-04 DIAGNOSIS — I4819 Other persistent atrial fibrillation: Secondary | ICD-10-CM | POA: Insufficient documentation

## 2021-04-04 SURGERY — CARDIOVERSION
Anesthesia: General

## 2021-04-04 NOTE — Progress Notes (Signed)
Patient returns for ECG feeling that he had converted to SR. ECG confirms SR HR 61, PR 166, QRS 100, QTc 440. Will cancel DCCV.

## 2021-04-11 ENCOUNTER — Ambulatory Visit (HOSPITAL_COMMUNITY): Payer: Commercial Managed Care - PPO | Admitting: Physician Assistant

## 2021-06-19 DIAGNOSIS — I428 Other cardiomyopathies: Secondary | ICD-10-CM | POA: Insufficient documentation

## 2021-06-23 ENCOUNTER — Other Ambulatory Visit: Payer: Self-pay

## 2021-06-23 ENCOUNTER — Ambulatory Visit: Payer: Commercial Managed Care - PPO | Admitting: Internal Medicine

## 2021-06-23 ENCOUNTER — Encounter: Payer: Self-pay | Admitting: Internal Medicine

## 2021-06-23 VITALS — BP 90/64 | HR 55 | Ht 73.0 in | Wt 189.2 lb

## 2021-06-23 DIAGNOSIS — I4819 Other persistent atrial fibrillation: Secondary | ICD-10-CM

## 2021-06-23 DIAGNOSIS — I428 Other cardiomyopathies: Secondary | ICD-10-CM

## 2021-06-23 MED ORDER — FLECAINIDE ACETATE 100 MG PO TABS: ORAL_TABLET | ORAL | 0 refills | Status: DC

## 2021-06-23 NOTE — Progress Notes (Signed)
Patient Care Team: Burnard Bunting, MD as PCP - General (Internal Medicine)   HPI  Steven Gray is a 71 y.o. male seen following presentation with congestive heart failure found to be in atrial fibrillation with a rapid rate and with a cardiomyopathy with depressed LV function.  Initial efforts at TEE cardioversion were complicated by the presence of left atrial clot.  He was also found to have severe left atrial enlargement.  4 weeks of anticoagulation resulted in resolution of the clot and he underwent cardioversion and feels much much better.    Taking beta-blockers and losartan.  Interval atrial fibrillation 7/21 and again 11/21   Underwent catheter ablation for atrial fibrillation 1/22 (JA) and repeat 8/22.  Preprocedural CT scan demonstrated coronary calcification.  Myoview scanning was negative.  Started on statins  Anticoagulation w Apixoban without bleeding  The patient denies chest pain, shortness of breath, nocturnal dyspnea, orthopnea or peripheral edema.  There have been, lightheadedness or syncope.  Complains of atrial fibrillation--about 10 days, about 3 months ago.  None since.  Decreased his alcohol intake.   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   Hyperlipidemia    does not take medication for this, denies   Increased prostate specific antigen (PSA) velocity    Left atrial enlargement     Nonischemic cardiomyopathy ([place]) 06/03/2019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   Persistent atrial fibrillation ([place]) 06/03/2019   Right knee pain 12/07/2016   Sleep apnea    wears CPAP    Past Surgical History:  Procedure Laterality Date   ATRIAL FIBRILLATION ABLATION N/A 05/14/2020   Procedure: ATRIAL FIBRILLATION ABLATION;  Surgeon: Thompson Grayer, MD;  Location: [place] [facility];  Service: Cardiovascular;  Laterality: N/A;   ATRIAL FIBRILLATION ABLATION N/A 12/03/2020   Procedure: ATRIAL FIBRILLATION ABLATION;  Surgeon: Thompson Grayer, MD;  Location: [place] [facility];  Service: Cardiovascular;  Laterality: N/A;   CARDIOVERSION N/A 07/04/2019   Procedure: CARDIOVERSION;  Surgeon: Sueanne Margarita, MD;  Location: [facility] ENDOSCOPY;  Service: Cardiovascular;  Laterality: N/A;   CARDIOVERSION N/A 11/30/2019   Procedure: CARDIOVERSION;  Surgeon: Lelon Perla, MD;  Location: [facility] ENDOSCOPY;  Service: Cardiovascular;  Laterality: N/A;   CARDIOVERSION N/A 04/02/2020   Procedure: CARDIOVERSION;  Surgeon: Skeet Latch, MD;  Location: [place];  Service: Cardiovascular;  Laterality: N/A;   CARDIOVERSION N/A 10/02/2020   Procedure: CARDIOVERSION;  Surgeon: Minna Merritts, MD;  Location: [place] [facility];  Service: Cardiovascular;  Laterality: N/A;   CARDIOVERSION N/A 11/06/2020   Procedure: CARDIOVERSION;  Surgeon: Donato Heinz, MD;  Location: [place];  Service: Cardiovascular;  Laterality: N/A;   MENISCUS REPAIR     TEE WITHOUT CARDIOVERSION N/A 06/05/2019  Procedure: TRANSESOPHAGEAL ECHOCARDIOGRAM (TEE);  Surgeon: Josue Hector, MD;  Location: [facility] ENDOSCOPY;  Service: Cardiovascular;  Laterality: N/A;   TEE WITHOUT CARDIOVERSION N/A 07/04/2019   Procedure: TRANSESOPHAGEAL ECHOCARDIOGRAM (TEE);  Surgeon: Sueanne Margarita, MD;  Location: [facility] ENDOSCOPY;  Service: Cardiovascular;  Laterality: N/A;    Current Meds  Medication Sig   apixaban (ELIQUIS) 5 MG TABS tablet TAKE 1 TABLET(5 MG) BY MOUTH  TWICE DAILY   atorvastatin (LIPITOR) 40 MG tablet TAKE 1 TABLET(40 MG) BY MOUTH DAILY AT 6 PM   losartan (COZAAR) 25 MG tablet TAKE 1 TABLET(25 MG) BY MOUTH AT BEDTIME (Patient taking differently: Take 25 mg by mouth daily.)   zolpidem (AMBIEN) 10 MG tablet Take 2.5-5 mg by mouth at bedtime as needed for sleep.     No Known Allergies    Review of Systems negative except from HPI and PMH  Physical Exam BP 90/64    Pulse (!) 55    Ht 6\' 1"  (1.854 m)    Wt 189 lb 3.2 oz (85.8 kg)    SpO2 96%    BMI 24.96 kg/m  Well developed and nourished in no acute distress HENT normal Neck supple with JVP-  flat   Clear Regular rate and rhythm, no murmurs or gallops Abd-soft with active BS No Clubbing cyanosis edema Skin-warm and dry A & Oriented  Grossly normal sensory and motor function  ECG sinus at 55 Interval 16/09/46 Otherwise normal  CrCl cannot be calculated (Patient's most recent lab result is older than the maximum 21 days allowed.).   Assessment and  Plan Atrial fibrillation-persistent  Cardiomyopathy-presumed nonischemic question rate related  Left atrial enlargement-moderate<<-severe  Coronary artery calcification-calcium score 719 with negative Myoview       No interval atrial fibrillation over the last few months.  He will however, based on his discussions with Dr. Greggory Brandy, continue apixaban 5 mg twice daily for now   I think this is appropriate.  Using wearable technology, there may be a role for intermittent anticoagulation in line With the REACT-AF.  We also discussed the use of intermittent flecainide as a "pill in the pocket " approach; will prescribe flecainide 300 mg to be taken once, if he awakens having gone to sleep in atrial fibrillation, still in AF  Blood pressures about 90, and significant lightheadedness.  With his cardiomyopathy have been resolved, will continue him on losartan

## 2021-06-23 NOTE — Patient Instructions (Signed)
Medication Instructions:  Your physician has recommended you make the following change in your medication:   Flecainide 100mg  - Take 3 tablets by mouth at one time if you go to sleep in Afib and you are still in Afib upon waking.   *If you need a refill on your cardiac medications before your next appointment, please call your pharmacy*   Lab Work: None ordered.  If you have labs (blood work) drawn today and your tests are completely normal, you will receive your results only by: [place] (if you have MyChart) OR A paper copy in the mail If you have any lab test that is abnormal or we need to change your treatment, we will call you to review the results.   Testing/Procedures: None ordered.    Follow-Up: At [facility], you and your health needs are our priority.  As part of our continuing mission to provide you with exceptional heart care, we have created designated Provider Care Teams.  These Care Teams include your primary Cardiologist (physician) and Advanced Practice Providers (APPs -  Physician Assistants and Nurse Practitioners) who all work together to provide you with the care you need, when you need it.  We recommend signing up for the patient portal called "MyChart".  Sign up information is provided on this After Visit Summary.  MyChart is used to connect with patients for Virtual Visits (Telemedicine).  Patients are able to view lab/test results, encounter notes, upcoming appointments, etc.  Non-urgent messages can be sent to your provider as well.   To learn more about what you can do with MyChart, go to [email or web address].    Your next appointment:   6 months with Dr Caryl Comes

## 2021-08-20 ENCOUNTER — Other Ambulatory Visit: Payer: Self-pay | Admitting: *Deleted

## 2021-08-20 MED ORDER — LOSARTAN POTASSIUM 25 MG PO TABS: 25.0000 mg | ORAL_TABLET | Freq: Every day | ORAL | 3 refills | Status: DC

## 2021-09-15 ENCOUNTER — Ambulatory Visit (HOSPITAL_COMMUNITY)
Admission: RE | Admit: 2021-09-15 | Discharge: 2021-09-15 | Disposition: A | Discharge status: Home / Self Care | Payer: Commercial Managed Care - PPO | Source: Ambulatory Visit | Attending: Physician Assistant | Admitting: Physician Assistant

## 2021-09-15 VITALS — BP 130/84 | HR 115 | Ht 73.0 in | Wt 192.4 lb

## 2021-09-15 DIAGNOSIS — I484 Atypical atrial flutter: Secondary | ICD-10-CM

## 2021-09-15 DIAGNOSIS — I4892 Unspecified atrial flutter: Secondary | ICD-10-CM | POA: Insufficient documentation

## 2021-09-15 DIAGNOSIS — I2510 Atherosclerotic heart disease of native coronary artery without angina pectoris: Secondary | ICD-10-CM | POA: Insufficient documentation

## 2021-09-15 DIAGNOSIS — G4733 Obstructive sleep apnea (adult) (pediatric): Secondary | ICD-10-CM | POA: Insufficient documentation

## 2021-09-15 DIAGNOSIS — Z7901 Long term (current) use of anticoagulants: Secondary | ICD-10-CM | POA: Diagnosis not present

## 2021-09-15 DIAGNOSIS — Z79899 Other long term (current) drug therapy: Secondary | ICD-10-CM | POA: Insufficient documentation

## 2021-09-15 DIAGNOSIS — E785 Hyperlipidemia, unspecified: Secondary | ICD-10-CM | POA: Insufficient documentation

## 2021-09-15 DIAGNOSIS — I4819 Other persistent atrial fibrillation: Secondary | ICD-10-CM | POA: Diagnosis present

## 2021-09-15 DIAGNOSIS — D6869 Other thrombophilia: Secondary | ICD-10-CM | POA: Diagnosis not present

## 2021-09-15 LAB — BASIC METABOLIC PANEL
Anion gap: 5 (ref 5–15)
BUN: 16 mg/dL (ref 8–23)
CO2: 27 mmol/L (ref 22–32)
Calcium: 9.6 mg/dL (ref 8.9–10.3)
Chloride: 109 mmol/L (ref 98–111)
Creatinine, Ser: 1.16 mg/dL (ref 0.61–1.24)
GFR, Estimated: >60 mL/min (ref >60)
Glucose, Bld: 146 mg/dL — ABNORMAL HIGH (ref 70–99)
Potassium: 4.1 mmol/L (ref 3.5–5.1)
Sodium: 141 mmol/L (ref 135–145)

## 2021-09-15 LAB — CBC
HCT: 43.8 % (ref 39.0–52.0)
Hemoglobin: 15.1 g/dL (ref 13.0–17.0)
MCH: 33.8 pg (ref 26.0–34.0)
MCHC: 34.5 g/dL (ref 30.0–36.0)
MCV: 98 fL (ref 80.0–100.0)
Platelets: 206 10*3/uL (ref 150–400)
RBC: 4.47 MIL/uL (ref 4.22–5.81)
RDW: 12 % (ref 11.5–15.5)
WBC: 8.4 10*3/uL (ref 4.0–10.5)
nRBC: 0 % (ref 0.0–0.2)

## 2021-09-15 MED ORDER — METOPROLOL SUCCINATE ER 25 MG PO TB24: 25.0000 mg | ORAL_TABLET | Freq: Every day | ORAL | 3 refills | Status: DC

## 2021-09-15 NOTE — Patient Instructions (Signed)
Start metoprolol 25mg  once a day ? ?Cardioversion scheduled for Thursday, May 18th ? - Arrive at the [facility] and go to admitting at 7am ? - Do not eat or drink anything after midnight the night prior to your procedure. ? - Take all your morning medication (except diabetic medications) with a sip of water prior to arrival. ? - You will not be able to drive home after your procedure. ? - Do NOT miss any doses of your blood thinner - if you should miss a dose please notify our office immediately. ? - If you feel as if you go back into normal rhythm prior to scheduled cardioversion, please notify our office immediately. If your procedure is canceled in the cardioversion suite you will be charged a cancellation fee. ? ?

## 2021-09-15 NOTE — Progress Notes (Signed)
? ? ?Primary Care Physician: Burnard Bunting, MD ?Primary Electrophysiologist: Dr Caryl Comes ?Referring Physician: Tommye Standard ? ? ?Steven Gray is a 71 y.o. male with a history of HLD, OSA, persistent atrial fibrillation, systolic CHF, CAD who presents for follow up in the [facility].  The patient was initially diagnosed with atrial fibrillation 06/03/19 after presenting to the ER with a 30-month onset of worsening dyspnea, orthopnea and PND.  Patient was previously seen by Dr. Burt Knack in 2014 for palpitation.  ETT at the time was low risk.  Heart monitor showed sinus rhythm with PAC and PVC, otherwise no significant arrhythmia. On arrival to the ED at [facility] on 06/03/2019, he was noted to be in new atrial fibrillation with RVR with heart rate of 110-120s. Echo showed reduced EF 30-35% with a severely dilated LA. Patient was started on Eliquis for a CHADS2VASC score of 2. Plan was for patient to undergo TEE/DCCV but TEE showed dense smoke and LA thrombus and so DCCV was not attempted. He was discharged on Toprol and digoxin for rate control. Patient underwent TEE guided DCCV on 07/04/19 which was successful. Repeat echo and cardiac MRI showed normalized EF with severe biatrial enlargement. Patient had done well until the week of 11/20/19 when his smart watch again showed afib. He reports good rate control and has been asymptomatic. Patient is s/p DCCV on 11/30/19. Patient is s/p afib ablation with Dr Rayann Heman on 05/14/20 but unfortunately had recurrent persistent afib. He had a DCCV on 11/06/20 and underwent repeat ablation on 12/03/20. ? ?On follow up today, patient reports that on Friday 09/12/21 he noted feeling anxious and checked his Fitbit which showed elevated heart rates and "Inconclusive" reading. He is in atrial flutter today. [place] over the past 3 weeks and admits  ? ?Today, he denies symptoms of palpitations, chest pain, orthopnea, PND, lower extremity  edema, dizziness, presyncope, syncope, bleeding, or neurologic sequela. The patient is tolerating medications without difficulties and is otherwise without complaint today.  ? ? ?Atrial Fibrillation Risk Factors: ? ?he does have symptoms or diagnosis of sleep apnea. ?he is compliant with CPAP therapy. ?he does not have a history of rheumatic fever. ?he does have a history of alcohol use. ?The patient does not have a history of early familial atrial fibrillation or other arrhythmias. ? ?he has a BMI of Body mass index is 25.38 kg/m?Marland KitchenMarland Kitchen ?Filed Weights  ? 09/15/21 0940  ?Weight: 87.3 kg  ? ? ? ?Family History  ?Problem Relation Age of Onset  ? Polycystic kidney disease Mother   ? Vascular Disease Father   ?     vascular dementia  ? CAD Maternal Grandfather   ? Atrial fibrillation Neg Hx   ? ? ? ?Atrial Fibrillation Management history: ? ?Previous antiarrhythmic drugs: none ?Previous cardioversions: 07/04/19, 11/30/19, 11/06/20 ?Previous ablations: 05/14/20, 12/03/20 ?CHADS2VASC score: 2 ?Anticoagulation history: Eliquis ? ? ?Past Medical History:  ?Diagnosis Date  ? Alcohol use 06/03/2019  ? he says not heavy  ? Haglund's deformity of left heel 09/26/2015  ? Hyperlipidemia   ? does not take medication for this, denies  ? Increased prostate specific antigen (PSA) velocity   ? Left atrial enlargement   ? Nonischemic cardiomyopathy ([place]) 06/03/2019  ? likely tachycardia mediated  ? Persistent atrial fibrillation ([place]) 06/03/2019  ? Right knee pain 12/07/2016  ? Sleep apnea   ? wears CPAP  ? ?Past Surgical History:  ?Procedure Laterality Date  ?  ATRIAL FIBRILLATION ABLATION N/A 05/14/2020  ? Procedure: ATRIAL FIBRILLATION ABLATION;  Surgeon: Thompson Grayer, MD;  Location: [place] [facility];  Service: Cardiovascular;  Laterality: N/A;  ? ATRIAL FIBRILLATION ABLATION N/A 12/03/2020  ? Procedure: ATRIAL FIBRILLATION ABLATION;  Surgeon: Thompson Grayer, MD;  Location: [place] [facility];  Service: Cardiovascular;  Laterality: N/A;  ?  CARDIOVERSION N/A 07/04/2019  ? Procedure: CARDIOVERSION;  Surgeon: Sueanne Margarita, MD;  Location: [facility] ENDOSCOPY;  Service: Cardiovascular;  Laterality: N/A;  ? CARDIOVERSION N/A 11/30/2019  ? Procedure: CARDIOVERSION;  Surgeon: Lelon Perla, MD;  Location: [facility] ENDOSCOPY;  Service: Cardiovascular;  Laterality: N/A;  ? CARDIOVERSION N/A 04/02/2020  ? Procedure: CARDIOVERSION;  Surgeon: Skeet Latch, MD;  Location: [place];  Service: Cardiovascular;  Laterality: N/A;  ? CARDIOVERSION N/A 10/02/2020  ? Procedure: CARDIOVERSION;  Surgeon: Minna Merritts, MD;  Location: [facility] ORS;  Service: Cardiovascular;  Laterality: N/A;  ? CARDIOVERSION N/A 11/06/2020  ? Procedure: CARDIOVERSION;  Surgeon: Donato Heinz, MD;  Location: [place];  Service: Cardiovascular;  Laterality: N/A;  ? MENISCUS REPAIR    ? TEE WITHOUT CARDIOVERSION N/A 06/05/2019  ? Procedure: TRANSESOPHAGEAL ECHOCARDIOGRAM (TEE);  Surgeon: Josue Hector, MD;  Location: [facility] ENDOSCOPY;  Service: Cardiovascular;  Laterality: N/A;  ? TEE WITHOUT CARDIOVERSION N/A 07/04/2019  ? Procedure: TRANSESOPHAGEAL ECHOCARDIOGRAM (TEE);  Surgeon: Sueanne Margarita, MD;  Location: [place];  Service: Cardiovascular;  Laterality: N/A;  ? ? ?Current Outpatient Medications  ?Medication Sig Dispense Refill  ? apixaban (ELIQUIS) 5 MG TABS tablet TAKE 1 TABLET(5 MG) BY MOUTH TWICE DAILY 180 tablet 1  ? atorvastatin (LIPITOR) 40 MG tablet TAKE 1 TABLET(40 MG) BY MOUTH DAILY AT 6 PM 30 tablet 11  ? losartan (COZAAR) 25 MG tablet Take 1 tablet (25 mg total) by mouth daily. 90 tablet 3  ? metoprolol succinate (TOPROL XL) 25 MG 24 hr tablet Take 1 tablet (25 mg total) by mouth daily. 30 tablet 3  ? zolpidem (AMBIEN) 10 MG tablet Take 2.5-5 mg by mouth at bedtime as needed for sleep.    ? flecainide (TAMBOCOR) 100 MG tablet Take 3 tablets at once as directed (Patient not taking: Reported on 09/15/2021) 6 tablet 0  ? ?No current facility-administered medications for  this encounter.  ? ? ?No Known Allergies ? ?Social History  ? ?Socioeconomic History  ? Marital status: Married  ?  Spouse name: Massie Maroon   ? Number of children: 2  ? Years of education: Not on file  ? Highest education level: Not on file  ?Occupational History  ? Occupation: warehousing  ?Tobacco Use  ? Smoking status: Never  ? Smokeless tobacco: Never  ?Substance and Sexual Activity  ? Alcohol use: Not Currently  ?  Comment: he reports a couple glasses of wine  ? Drug use: No  ? Sexual activity: Not on file  ?Other Topics Concern  ? Not on file  ?Social History Narrative  ? Married with 2 sons, nonsmoker, social ETOH, works as Programme researcher, broadcasting/film/video in [employer], prison and probation services. Frequent exercise.  ? ?Social Determinants of Health  ? ?Financial Resource Strain: Not on file  ?Food Insecurity: Not on file  ?Transportation Needs: Not on file  ?Physical Activity: Not on file  ?Stress: Not on file  ?Social Connections: Not on file  ?Intimate Partner Violence: Not on file  ? ? ? ?ROS- All systems are reviewed and negative except as per the HPI above. ? ?Physical Exam: ?Vitals:  ? 09/15/21 0940  ?BP:  130/84  ?Pulse: (!) 115  ?Weight: 87.3 kg  ?Height: 6\' 1"  (1.854 m)  ? ? ? ?GEN- The patient is a well appearing male, alert and oriented x 3 today.   ?HEENT-head normocephalic, atraumatic, sclera clear, conjunctiva pink, hearing intact, trachea midline. ?Lungs- Clear to ausculation bilaterally, normal work of breathing ?Heart- irregular rate and rhythm, no murmurs, rubs or gallops  ?GI- soft, NT, ND, + BS ?Extremities- no clubbing, cyanosis, or edema ?MS- no significant deformity or atrophy ?Skin- no rash or lesion ?Psych- euthymic mood, full affect ?Neuro- strength and sensation are intact ? ? ?Wt Readings from Last 3 Encounters:  ?09/15/21 87.3 kg  ?06/23/21 85.8 kg  ?03/25/21 84.3 kg  ? ? ?EKG today demonstrates  ?Atypical atrial flutter with variable block ?Vent. rate 115 BPM ?PR interval * ms ?QRS duration 92 ms ?QT/QTcB 316/437 ms ? ?Echo  08/28/19 demonstrated  ?1. Global longitudinal strain is -18.4%  ?COmpared to echo report in Jan 2021, LVEF is improved . Left  ?ventricular ejection fraction, by estimation, is 60 to 65%. The left  ?ventricle has n

## 2021-09-18 ENCOUNTER — Encounter (HOSPITAL_COMMUNITY)
Admission: RE | Disposition: A | Discharge status: Home / Self Care | Payer: Self-pay | Source: Home / Self Care | Attending: Cardiology

## 2021-09-18 ENCOUNTER — Ambulatory Visit (HOSPITAL_BASED_OUTPATIENT_CLINIC_OR_DEPARTMENT_OTHER): Payer: Commercial Managed Care - PPO | Admitting: Anesthesiology

## 2021-09-18 ENCOUNTER — Other Ambulatory Visit: Payer: Self-pay

## 2021-09-18 ENCOUNTER — Ambulatory Visit (HOSPITAL_COMMUNITY)
Admission: RE | Admit: 2021-09-18 | Discharge: 2021-09-18 | Disposition: A | Discharge status: Home / Self Care | Payer: Commercial Managed Care - PPO | Attending: Cardiology | Admitting: Cardiology

## 2021-09-18 ENCOUNTER — Ambulatory Visit (HOSPITAL_COMMUNITY): Payer: Commercial Managed Care - PPO | Admitting: Anesthesiology

## 2021-09-18 ENCOUNTER — Encounter (HOSPITAL_COMMUNITY): Payer: Self-pay | Admitting: Cardiology

## 2021-09-18 ENCOUNTER — Ambulatory Visit: Payer: Commercial Managed Care - PPO | Admitting: Podiatry

## 2021-09-18 DIAGNOSIS — I5022 Chronic systolic (congestive) heart failure: Secondary | ICD-10-CM | POA: Diagnosis not present

## 2021-09-18 DIAGNOSIS — I4892 Unspecified atrial flutter: Secondary | ICD-10-CM

## 2021-09-18 DIAGNOSIS — D6869 Other thrombophilia: Secondary | ICD-10-CM | POA: Diagnosis not present

## 2021-09-18 DIAGNOSIS — G4733 Obstructive sleep apnea (adult) (pediatric): Secondary | ICD-10-CM | POA: Insufficient documentation

## 2021-09-18 DIAGNOSIS — I2510 Atherosclerotic heart disease of native coronary artery without angina pectoris: Secondary | ICD-10-CM | POA: Diagnosis not present

## 2021-09-18 DIAGNOSIS — I4819 Other persistent atrial fibrillation: Secondary | ICD-10-CM | POA: Insufficient documentation

## 2021-09-18 DIAGNOSIS — Z79899 Other long term (current) drug therapy: Secondary | ICD-10-CM | POA: Insufficient documentation

## 2021-09-18 DIAGNOSIS — I484 Atypical atrial flutter: Secondary | ICD-10-CM

## 2021-09-18 DIAGNOSIS — I110 Hypertensive heart disease with heart failure: Secondary | ICD-10-CM | POA: Insufficient documentation

## 2021-09-18 HISTORY — PX: CARDIOVERSION: SHX1299

## 2021-09-18 SURGERY — CARDIOVERSION
Anesthesia: General

## 2021-09-18 MED ORDER — PROPOFOL 10 MG/ML IV BOLUS
INTRAVENOUS | Status: DC | PRN
  Administered 2021-09-18: 60 mg via INTRAVENOUS

## 2021-09-18 MED ORDER — SODIUM CHLORIDE 0.9 % IV SOLN: INTRAVENOUS | Status: DC

## 2021-09-18 MED ORDER — LIDOCAINE 2% (20 MG/ML) 5 ML SYRINGE
INTRAMUSCULAR | Status: DC | PRN
  Administered 2021-09-18: 60 mg via INTRAVENOUS

## 2021-09-18 NOTE — Procedures (Signed)
Electrical Cardioversion Procedure Note Steven Gray [id number] November 30, 1950  Procedure: Electrical Cardioversion Indications:  Atrial Flutter  Procedure Details Consent: Risks of procedure as well as the alternatives and risks of each were explained to the (patient/caregiver).  Consent for procedure obtained. Time Out: Verified patient identification, verified procedure, site/side was marked, verified correct patient position, special equipment/implants available, medications/allergies/relevent history reviewed, required imaging and test results available.  Performed  Patient placed on cardiac monitor, pulse oximetry, supplemental oxygen as necessary.  Sedation given:  Pt sedated by anesthesia with lidocaine 60 mg and diprovan 60 mg IV. Pacer pads placed anterior and posterior chest.  Cardioverted 1 time(s).  Cardioverted at 120J.  Evaluation Findings: Post procedure EKG shows: Sinus bradycardia Complications: None Patient did tolerate procedure well.   Steven Gray 09/18/2021, 7:31 AM

## 2021-09-18 NOTE — Discharge Instructions (Signed)

## 2021-09-18 NOTE — Anesthesia Preprocedure Evaluation (Signed)
Anesthesia Evaluation  Patient identified by MRN, date of birth, ID band Patient awake    Reviewed: Allergy & Precautions, NPO status , Patient's Chart, lab work & pertinent test results, reviewed documented beta blocker date and time   History of Anesthesia Complications Negative for: history of anesthetic complications  Airway Mallampati: I  TM Distance: >3 FB Neck ROM: Full    Dental  (+) Dental Advisory Given   Pulmonary sleep apnea ,    breath sounds clear to auscultation       Cardiovascular hypertension, Pt. on medications and Pt. on home beta blockers +CHF  + dysrhythmias Atrial Fibrillation  Rhythm:Irregular Rate:Normal  1. Global longitudinal strain is -18.4%   COmpared to echo report in Jan 2021, LVEF is improved . Left  ventricular ejection fraction, by estimation, is 60 to 65%. The left  ventricle has normal function. The left ventricle has no regional wall  motion abnormalities. There is mild left  ventricular hypertrophy. Left ventricular diastolic parameters are  consistent with Grade I diastolic dysfunction (impaired relaxation).  2. Right ventricular systolic function is normal. The right ventricular  size is mildly enlarged. There is mildly elevated pulmonary artery  systolic pressure.  3. Left atrial size was severely dilated.  4. Right atrial size was severely dilated.  5. The mitral valve is normal in structure. Trivial mitral valve  regurgitation.  6. The aortic valve is normal in structure. Aortic valve regurgitation is  not visualized.  7. The inferior vena cava is dilated in size with >50% respiratory  variability, suggesting right atrial pressure of 8 mmHg.   ? Nuclear stress EF: 53%. ? There was no ST segment deviation noted during stress. ? The study is normal. ? This is a low risk study. ? The left ventricular ejection fraction is mildly decreased (45-54%).   No evidence of  ischemia. EF measures low normal but appears normal visually without significant wall motion abnormalities. Of note, patient did become hypotensive with regadenoso.    Neuro/Psych negative neurological ROS  negative psych ROS   GI/Hepatic negative GI ROS, Neg liver ROS,   Endo/Other  negative endocrine ROS  Renal/GU negative Renal ROS  negative genitourinary   Musculoskeletal negative musculoskeletal ROS (+)   Abdominal Normal abdominal exam  (+)   Peds  Hematology eliquis  Lab Results      Component                Value               Date                      WBC                      7.2                 11/12/2020                HGB                      14.9                11/12/2020                HCT                      44.4  11/12/2020                MCV                      97                  11/12/2020                PLT                      174                 11/12/2020              Anesthesia Other Findings   Reproductive/Obstetrics                             Anesthesia Physical  Anesthesia Plan  ASA: 2  Anesthesia Plan: General   Post-op Pain Management:    Induction:   PONV Risk Score and Plan: 2 and Propofol infusion and TIVA  Airway Management Planned: Natural Airway and Simple Face Mask  Additional Equipment: None  Intra-op Plan:   Post-operative Plan: Extubation in OR  Informed Consent: I have reviewed the patients History and Physical, chart, labs and discussed the procedure including the risks, benefits and alternatives for the proposed anesthesia with the patient or authorized representative who has indicated his/her understanding and acceptance.       Plan Discussed with: CRNA  Anesthesia Plan Comments:         Anesthesia Quick Evaluation

## 2021-09-18 NOTE — Anesthesia Postprocedure Evaluation (Signed)
Anesthesia Post Note  Patient: Steven Gray  Procedure(s) Performed: CARDIOVERSION     Patient location during evaluation: PACU Anesthesia Type: General Level of consciousness: awake Pain management: pain level controlled Vital Signs Assessment: post-procedure vital signs reviewed and stable Respiratory status: spontaneous breathing Cardiovascular status: stable Postop Assessment: no apparent nausea or vomiting Anesthetic complications: no   No notable events documented.  Last Vitals:  Vitals:   09/18/21 0800 09/18/21 0805  BP: 96/65 98/74  Pulse: 60 61  Resp: 14 14  Temp:    SpO2: 99% 100%    Last Pain:  Vitals:   09/18/21 0805  TempSrc:   PainSc: 0-No pain                 Caren Macadam

## 2021-09-18 NOTE — H&P (Signed)
ATRIAL FIB OFFICE VISIT 09/15/2021 [place] [facility] [place], [place] R, [place] Cardiology Atypical atrial flutter (HCC) +2 more Dx Referred by Burnard Bunting, MD Reason for Visit   Additional Documentation  Vitals:  BP 130/84 Pulse 115 Important   Ht 6\' 1"  (1.854 m) Wt 87.3 kg BMI 25.38 kg/m BSA 2.12 m  More Vitals  Flowsheets:  Anthropometrics, NEWS, MEWS Score, CHADS2-VASc Score, Method of Visit   Encounter Info:  Billing Info, History, Allergies, Detailed Report    All Notes   Progress Notes by Oliver Barre, PA at 09/15/2021 9:30 AM  Author: Oliver Barre, PA Author Type: Physician Assistant Filed: 09/15/2021 10:35 AM  Note Status: Signed Cosign: Cosign Not Required Date of Service: 09/15/2021  9:30 AM  Editor: Oliver Barre, PA (Physician Assistant)                  Primary Care Physician: Burnard Bunting, MD Primary Electrophysiologist: Dr Caryl Comes Referring Physician: Tommye Standard     Steven Gray is a 71 y.o. male with a history of HLD, OSA, persistent atrial fibrillation, systolic CHF, CAD who presents for follow up in the [facility].  The patient was initially diagnosed with atrial fibrillation 06/03/19 after presenting to the ER with a 61-month onset of worsening dyspnea, orthopnea and PND.  Patient was previously seen by Dr. Burt Knack in 2014 for palpitation.  ETT at the time was low risk.  Heart monitor showed sinus rhythm with PAC and PVC, otherwise no significant arrhythmia. On arrival to the ED at [facility] on 06/03/2019, he was noted to be in new atrial fibrillation with RVR with heart rate of 110-120s. Echo showed reduced EF 30-35% with a severely dilated LA. Patient was started on Eliquis for a CHADS2VASC score of 2. Plan was for patient to undergo TEE/DCCV but TEE showed dense smoke and LA thrombus and so DCCV was not attempted. He was discharged on Toprol and digoxin for rate control. Patient  underwent TEE guided DCCV on 07/04/19 which was successful. Repeat echo and cardiac MRI showed normalized EF with severe biatrial enlargement. Patient had done well until the week of 11/20/19 when his smart watch again showed afib. He reports good rate control and has been asymptomatic. Patient is s/p DCCV on 11/30/19. Patient is s/p afib ablation with Dr Rayann Heman on 05/14/20 but unfortunately had recurrent persistent afib. He had a DCCV on 11/06/20 and underwent repeat ablation on 12/03/20.   On follow up today, patient reports that on Friday 09/12/21 he noted feeling anxious and checked his Fitbit which showed elevated heart rates and "Inconclusive" reading. He is in atrial flutter today. He has been [place] over the past 3 weeks and admits    Today, he denies symptoms of palpitations, chest pain, orthopnea, PND, lower extremity edema, dizziness, presyncope, syncope, bleeding, or neurologic sequela. The patient is tolerating medications without difficulties and is otherwise without complaint today.      Atrial Fibrillation Risk Factors:   he does have symptoms or diagnosis of sleep apnea. he is compliant with CPAP therapy. he does not have a history of rheumatic fever. he does have a history of alcohol use. The patient does not have a history of early familial atrial fibrillation or other arrhythmias.   he has a BMI of Body mass index is 25.38 kg/m.Marland Kitchen    Filed Weights    09/15/21 0940  Weight: 87.3 kg  Family History  Problem Relation Age of Onset   Polycystic kidney disease Mother     Vascular Disease Father          vascular dementia   CAD Maternal Grandfather     Atrial fibrillation Neg Hx          Atrial Fibrillation Management history:   Previous antiarrhythmic drugs: none Previous cardioversions: 07/04/19, 11/30/19, 11/06/20 Previous ablations: 05/14/20, 12/03/20 CHADS2VASC score: 2 Anticoagulation history: Eliquis         Past Medical History:  Diagnosis Date    Alcohol use 06/03/2019    he says not heavy   Haglund's deformity of left heel 09/26/2015   Hyperlipidemia      does not take medication for this, denies   Increased prostate specific antigen (PSA) velocity     Left atrial enlargement     Nonischemic cardiomyopathy ([place]) 06/03/2019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   Persistent atrial fibrillation ([place]) 06/03/2019   Right knee pain 12/07/2016   Sleep apnea      wears CPAP         Past Surgical History:  Procedure Laterality Date   ATRIAL FIBRILLATION ABLATION N/A 05/14/2020    Procedure: ATRIAL FIBRILLATION ABLATION;  Surgeon: Thompson Grayer, MD;  Location: [place] [facility];  Service: Cardiovascular;  Laterality: N/A;   ATRIAL FIBRILLATION ABLATION N/A 12/03/2020    Procedure: ATRIAL FIBRILLATION ABLATION;  Surgeon: Thompson Grayer, MD;  Location: [place] [facility];  Service: Cardiovascular;  Laterality: N/A;   CARDIOVERSION N/A 07/04/2019    Procedure: CARDIOVERSION;  Surgeon: Sueanne Margarita, MD;  Location: [place];  Service: Cardiovascular;  Laterality: N/A;   CARDIOVERSION N/A 11/30/2019    Procedure: CARDIOVERSION;  Surgeon: Lelon Perla, MD;  Location: [facility] ENDOSCOPY;  Service: Cardiovascular;  Laterality: N/A;   CARDIOVERSION N/A 04/02/2020    Procedure: CARDIOVERSION;  Surgeon: Skeet Latch, MD;  Location: [place];  Service: Cardiovascular;  Laterality: N/A;   CARDIOVERSION N/A 10/02/2020    Procedure: CARDIOVERSION;  Surgeon: Minna Merritts, MD;  Location: [place] [facility];  Service: Cardiovascular;  Laterality: N/A;   CARDIOVERSION N/A 11/06/2020    Procedure: CARDIOVERSION;  Surgeon: Donato Heinz, MD;  Location: [place];  Service: Cardiovascular;  Laterality: N/A;   MENISCUS REPAIR       TEE WITHOUT CARDIOVERSION N/A 06/05/2019    Procedure: TRANSESOPHAGEAL ECHOCARDIOGRAM (TEE);  Surgeon: Josue Hector, MD;  Location: [facility] ENDOSCOPY;  Service: Cardiovascular;  Laterality: N/A;   TEE WITHOUT CARDIOVERSION N/A  07/04/2019    Procedure: TRANSESOPHAGEAL ECHOCARDIOGRAM (TEE);  Surgeon: Sueanne Margarita, MD;  Location: [facility] ENDOSCOPY;  Service: Cardiovascular;  Laterality: N/A;            Current Outpatient Medications  Medication Sig Dispense Refill   apixaban (ELIQUIS) 5 MG TABS tablet TAKE 1 TABLET(5 MG) BY MOUTH TWICE DAILY 180 tablet 1   atorvastatin (LIPITOR) 40 MG tablet TAKE 1 TABLET(40 MG) BY MOUTH DAILY AT 6 PM 30 tablet 11   losartan (COZAAR) 25 MG tablet Take 1 tablet (25 mg total) by mouth daily. 90 tablet 3   metoprolol succinate (TOPROL XL) 25 MG 24 hr tablet Take 1 tablet (25 mg total) by mouth daily. 30 tablet 3   zolpidem (AMBIEN) 10 MG tablet Take 2.5-5 mg by mouth at bedtime as needed for sleep.       flecainide (TAMBOCOR) 100 MG tablet Take 3 tablets at once as directed (Patient not taking: Reported on 09/15/2021) 6  tablet 0    No current facility-administered medications for this encounter.      No Known Allergies   Social History         Socioeconomic History   Marital status: Married      Spouse name: Massie Maroon    Number of children: 2   Years of education: Not on file   Highest education level: Not on file  Occupational History   Occupation: warehousing  Tobacco Use   Smoking status: Never   Smokeless tobacco: Never  Substance and Sexual Activity   Alcohol use: Not Currently      Comment: he reports a couple glasses of wine   Drug use: No   Sexual activity: Not on file  Other Topics Concern   Not on file  Social History Narrative    Married with 2 sons, nonsmoker, social ETOH, works as Programme researcher, broadcasting/film/video in [employer], prison and probation services. Frequent exercise.    Social Determinants of Health    Financial Resource Strain: Not on file  Food Insecurity: Not on file  Transportation Needs: Not on file  Physical Activity: Not on file  Stress: Not on file  Social Connections: Not on file  Intimate Partner Violence: Not on file        ROS- All systems are reviewed and negative except as per  the HPI above.   Physical Exam:    Vitals:    09/15/21 0940  BP: 130/84  Pulse: (!) 115  Weight: 87.3 kg  Height: 6\' 1"  (1.854 m)        GEN- The patient is a well appearing male, alert and oriented x 3 today.   HEENT-head normocephalic, atraumatic, sclera clear, conjunctiva pink, hearing intact, trachea midline. Lungs- Clear to ausculation bilaterally, normal work of breathing Heart- irregular rate and rhythm, no murmurs, rubs or gallops  GI- soft, NT, ND, + BS Extremities- no clubbing, cyanosis, or edema MS- no significant deformity or atrophy Skin- no rash or lesion Psych- euthymic mood, full affect Neuro- strength and sensation are intact        Wt Readings from Last 3 Encounters:  09/15/21 87.3 kg  06/23/21 85.8 kg  03/25/21 84.3 kg      EKG today demonstrates  Atypical atrial flutter with variable block Vent. rate 115 BPM PR interval * ms QRS duration 92 ms QT/QTcB 316/437 ms   Echo 08/28/19 demonstrated  1. Global longitudinal strain is -18.4%  COmpared to echo report in Jan 2021, LVEF is improved . Left  ventricular ejection fraction, by estimation, is 60 to 65%. The left  ventricle has normal function. The left ventricle has no regional wall  motion abnormalities. There is mild left ventricular hypertrophy. Left ventricular diastolic parameters are consistent with Grade I diastolic dysfunction (impaired relaxation).   2. Right ventricular systolic function is normal. The right ventricular  size is mildly enlarged. There is mildly elevated pulmonary artery  systolic pressure.   3. Left atrial size was severely dilated.   4. Right atrial size was severely dilated.   5. The mitral valve is normal in structure. Trivial mitral valve  regurgitation.   6. The aortic valve is normal in structure. Aortic valve regurgitation is  not visualized.   7. The inferior vena cava is dilated in size with >50% respiratory  variability, suggesting right atrial pressure of 8  mmHg.    Epic records are reviewed at length today   CHA2DS2-VASc Score = 2  The patient's score is based upon: CHF History: 0 (  EF normalized) HTN History: 0 Diabetes History: 0 Stroke History: 0 Vascular Disease History: 1 Age Score: 1 Gender Score: 0           ASSESSMENT AND PLAN: 1. Persistent Atrial Fibrillation/atrial flutter The patient's CHA2DS2-VASc score is 2, indicating a 2.2% annual risk of stroke.   S/p afib ablation 05/14/20 with repeat ablation 12/03/20 Patient in atrial flutter today with elevated heart rates. Continue Eliquis 5 mg BID Will resume Toprol 25 mg daily Will arrange for DCCV if he does not convert on his own.  Fitbit for home monitoring.    2. Secondary Hypercoagulable State (ICD10:  D68.69) The patient is at significant risk for stroke/thromboembolism based upon his CHA2DS2-VASc Score of 2.  Continue Apixaban (Eliquis).     3. Obstructive sleep apnea Patient reports compliance with CPAP therapy.   4. Systolic dysfunction Resolved with SR. No signs or symptoms of fluid overload.   5. CAD CAC score 74 82nd percentile  On statin Low risk stress test 06/04/20 No anginal symptoms.      Follow up in the AF clinic post DCCV.      [facility] [place], [place] [address] [phone] 09/15/2021 10:21 AM      For DCCV; no changes; compliant with apixaban.  Kirk Ruths

## 2021-09-18 NOTE — Interval H&P Note (Signed)
History and Physical Interval Note:  09/18/2021 7:34 AM  Steven Gray  has presented today for surgery, with the diagnosis of AFIB.  The various methods of treatment have been discussed with the patient and family. After consideration of risks, benefits and other options for treatment, the patient has consented to  Procedure(s): CARDIOVERSION (N/A) as a surgical intervention.  The patient's history has been reviewed, patient examined, no change in status, stable for surgery.  I have reviewed the patient's chart and labs.  Questions were answered to the patient's satisfaction.     Kirk Ruths

## 2021-09-18 NOTE — Transfer of Care (Signed)
Immediate Anesthesia Transfer of Care Note  Patient: Steven Gray  Procedure(s) Performed: CARDIOVERSION  Patient Location: Endoscopy Unit  Anesthesia Type:General  Level of Consciousness: awake and alert   Airway & Oxygen Therapy: Patient Spontanous Breathing  Post-op Assessment: Report given to RN and Post -op Vital signs reviewed and stable  Post vital signs: Reviewed and stable  Last Vitals:  Vitals Value Taken Time  BP 94/72   Temp    Pulse 64   Resp 12   SpO2 100     Last Pain:  Vitals:   09/18/21 0708  TempSrc: Temporal         Complications: No notable events documented.

## 2021-09-18 NOTE — Anesthesia Procedure Notes (Signed)
Procedure Name: MAC Date/Time: 09/18/2021 7:37 AM Performed by: Lorie Phenix, CRNA Pre-anesthesia Checklist: Patient identified, Emergency Drugs available, Patient being monitored and Suction available Patient Re-evaluated:Patient Re-evaluated prior to induction Oxygen Delivery Method: Nasal cannula Placement Confirmation: positive ETCO2

## 2021-09-19 ENCOUNTER — Encounter (HOSPITAL_COMMUNITY): Payer: Self-pay | Admitting: Cardiology

## 2021-09-24 ENCOUNTER — Telehealth (HOSPITAL_COMMUNITY): Payer: Self-pay

## 2021-09-24 MED ORDER — METOPROLOL SUCCINATE ER 25 MG PO TB24: ORAL_TABLET | ORAL | Status: DC

## 2021-09-24 NOTE — Telephone Encounter (Signed)
Patient called regarding his post cardioversion follow up appointment which is scheduled for Friday at the [facility]. Patient is feeling great and is no longer taking the Metoprolol medication. Per Eyvonne Left it is not necessary for him to come in on Friday for follow up. He is scheduled for a recall to see Dr. Graciela Husbands in August. Patient verbalized understanding.

## 2021-09-25 ENCOUNTER — Ambulatory Visit: Payer: Commercial Managed Care - PPO | Admitting: Podiatry

## 2021-09-26 ENCOUNTER — Ambulatory Visit (HOSPITAL_COMMUNITY): Payer: Commercial Managed Care - PPO | Admitting: Physician Assistant

## 2021-09-27 ENCOUNTER — Other Ambulatory Visit: Payer: Self-pay | Admitting: Internal Medicine

## 2021-09-30 NOTE — Telephone Encounter (Signed)
Age 71, weight 85kg, SCr 1.16 09/2021, last visit 06/2021, afib indication

## 2021-10-01 ENCOUNTER — Telehealth: Payer: Self-pay | Admitting: Internal Medicine

## 2021-10-01 DIAGNOSIS — I484 Atypical atrial flutter: Secondary | ICD-10-CM

## 2021-10-01 DIAGNOSIS — R002 Palpitations: Secondary | ICD-10-CM

## 2021-10-01 DIAGNOSIS — Z79899 Other long term (current) drug therapy: Secondary | ICD-10-CM

## 2021-10-01 DIAGNOSIS — I428 Other cardiomyopathies: Secondary | ICD-10-CM

## 2021-10-01 DIAGNOSIS — I4819 Other persistent atrial fibrillation: Secondary | ICD-10-CM

## 2021-10-01 NOTE — Telephone Encounter (Signed)
   Pt said, Dr. Caryl Comes advised him to get blood work but there's no order on file

## 2021-10-01 NOTE — Telephone Encounter (Signed)
Per Dr Graciela Husbands please order a BMET and Mg.  Orders placed.

## 2021-10-02 ENCOUNTER — Ambulatory Visit (INDEPENDENT_AMBULATORY_CARE_PROVIDER_SITE_OTHER): Payer: Commercial Managed Care - PPO

## 2021-10-02 ENCOUNTER — Other Ambulatory Visit: Payer: Commercial Managed Care - PPO | Admitting: *Deleted

## 2021-10-02 ENCOUNTER — Ambulatory Visit: Payer: Commercial Managed Care - PPO | Admitting: Podiatry

## 2021-10-02 DIAGNOSIS — I4819 Other persistent atrial fibrillation: Secondary | ICD-10-CM

## 2021-10-02 DIAGNOSIS — M7989 Other specified soft tissue disorders: Secondary | ICD-10-CM

## 2021-10-02 DIAGNOSIS — M79672 Pain in left foot: Secondary | ICD-10-CM

## 2021-10-02 DIAGNOSIS — Z79899 Other long term (current) drug therapy: Secondary | ICD-10-CM

## 2021-10-02 DIAGNOSIS — I484 Atypical atrial flutter: Secondary | ICD-10-CM

## 2021-10-02 DIAGNOSIS — R002 Palpitations: Secondary | ICD-10-CM

## 2021-10-02 DIAGNOSIS — I428 Other cardiomyopathies: Secondary | ICD-10-CM

## 2021-10-02 LAB — BASIC METABOLIC PANEL
BUN/Creatinine Ratio: 15 (ref 10–24)
BUN: 19 mg/dL (ref 8–27)
CO2: 26 mmol/L (ref 20–29)
Calcium: 10.3 mg/dL — ABNORMAL HIGH (ref 8.6–10.2)
Chloride: 104 mmol/L (ref 96–106)
Creatinine, Ser: 1.26 mg/dL (ref 0.76–1.27)
Glucose: 105 mg/dL — ABNORMAL HIGH (ref 70–99)
Potassium: 4.7 mmol/L (ref 3.5–5.2)
Sodium: 142 mmol/L (ref 134–144)
eGFR: 62 mL/min/{1.73_m2} (ref >59)

## 2021-10-02 LAB — MAGNESIUM: Magnesium: 2.3 mg/dL (ref 1.6–2.3)

## 2021-10-02 NOTE — Patient Instructions (Signed)

## 2021-10-04 NOTE — Progress Notes (Signed)
Subjective:   Patient ID: Steven Gray, male   DOB: 71 y.o.   MRN: [id number]   HPI 71 year old male presents the office with concerns of a cyst, pain in the bottom of his left foot underneath the second toe area.  No recent injury.  Gets discomfort in the walking without shoes.  Describes a dull, sore sensation.  This about 4 to 5 months ago.  No injury.  No treatment.  No numbness or tingling.  No other concerns.   Review of Systems  All other systems reviewed and are negative.  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   Persistent atrial fibrillation (HCC) 06/03/2019   Right knee pain 12/07/2016   Sleep apnea    wears CPAP    Past Surgical History:  Procedure Laterality Date   ATRIAL FIBRILLATION ABLATION N/A 05/14/2020   Procedure: ATRIAL FIBRILLATION ABLATION;  Surgeon: Hillis Range, MD;  Location: [facility];  Service: Cardiovascular;  Laterality: N/A;   ATRIAL FIBRILLATION ABLATION N/A 12/03/2020   Procedure: ATRIAL FIBRILLATION ABLATION;  Surgeon: Hillis Range, MD;  Location: [facility];  Service: Cardiovascular;  Laterality: N/A;   CARDIOVERSION N/A 07/04/2019   Procedure: CARDIOVERSION;  Surgeon: Quintella Reichert, MD;  Location: [facility] ENDOSCOPY;  Service: Cardiovascular;  Laterality: N/A;   CARDIOVERSION N/A 11/30/2019   Procedure: CARDIOVERSION;  Surgeon: Lewayne Bunting, MD;  Location: [facility] ENDOSCOPY;  Service: Cardiovascular;  Laterality: N/A;   CARDIOVERSION N/A 04/02/2020   Procedure: CARDIOVERSION;  Surgeon: Chilton Si, MD;  Location: [facility] ENDOSCOPY;  Service: Cardiovascular;  Laterality: N/A;   CARDIOVERSION N/A 10/02/2020   Procedure:  CARDIOVERSION;  Surgeon: Antonieta Iba, MD;  Location: [facility];  Service: Cardiovascular;  Laterality: N/A;   CARDIOVERSION N/A 11/06/2020   Procedure: CARDIOVERSION;  Surgeon: [name], MD;  Location: [facility] ENDOSCOPY;  Service: Cardiovascular;  Laterality: N/A;   CARDIOVERSION N/A 09/18/2021   Procedure: CARDIOVERSION;  Surgeon: Lewayne Bunting, MD;  Location: [facility] ENDOSCOPY;  Service: Cardiovascular;  Laterality: N/A;   MENISCUS REPAIR     TEE WITHOUT CARDIOVERSION N/A 06/05/2019   Procedure: TRANSESOPHAGEAL ECHOCARDIOGRAM (TEE);  Surgeon: Wendall Stade, MD;  Location: [facility] ENDOSCOPY;  Service: Cardiovascular;  Laterality: N/A;   TEE WITHOUT CARDIOVERSION N/A 07/04/2019   Procedure: TRANSESOPHAGEAL ECHOCARDIOGRAM (TEE);  Surgeon: Quintella Reichert, MD;  Location: [facility] ENDOSCOPY;  Service: Cardiovascular;  Laterality: N/A;     Current Outpatient Medications:    atorvastatin (LIPITOR) 40 MG tablet, TAKE 1 TABLET(40 MG) BY MOUTH DAILY AT 6 PM, Disp: 30 tablet, Rfl: 11   ELIQUIS 5 MG TABS tablet, TAKE 1 TABLET(5 MG) BY MOUTH TWICE DAILY, Disp: 180 tablet, Rfl: 1   flecainide (TAMBOCOR) 100 MG tablet, Take 3 tablets at once as directed (Patient not taking: Reported on 09/15/2021), Disp: 6 tablet, Rfl: 0   losartan (COZAAR) 25 MG tablet, Take 1 tablet (25 mg total) by mouth daily., Disp: 90 tablet, Rfl: 3   metoprolol succinate (TOPROL XL) 25 MG 24 hr tablet, Taking only as needed for A-fib episodes, Disp: , Rfl:    zolpidem (AMBIEN) 10 MG tablet, Take 2.5-5 mg by mouth at  bedtime as needed for sleep., Disp: , Rfl:   No Known Allergies        Objective:  Physical Exam  General: AAO x3, NAD  Dermatological: Skin is warm, dry and supple bilateral.  There are no open sores, no preulcerative lesions, no rash or signs of infection present.  Vascular: Dorsalis Pedis artery and Posterior Tibial artery pedal pulses are 2/4 bilateral with immedate capillary fill time.  There is no pain with  calf compression, swelling, warmth, erythema.   Neruologic: Grossly intact via light touch bilateral.   Musculoskeletal: There is tenderness along submetatarsal 2 3 today small mobile cyst present.  There is no edema or erythema or signs of infection to the area.  No pain with imaging range of motion.  Muscular strength 5/5 in all groups tested bilateral.  Gait: Unassisted, Nonantalgic.       Assessment:   Soft tissue mass left foot submetatarsal 2     Plan:  X-rays were obtained reviewed.  3 views were obtained.  There is no evidence of acute fracture or calcifications.  Discussed steroid injection was to proceed.  I cleaned the skin with alcohol and mixture of 0.5 cc of dexamethasone phosphate, 0.5 cc of Marcaine plain was infiltrated from a dorsal approach into the soft tissue mass plantarly.  He tolerated the injection well and complications.  Postinjection care discussed.  Discussed Voltaren gel topically and different exercises to help break up the cyst.  If no improvement MRI or further advanced imaging.  Vivi Barrack DPM

## 2021-10-15 ENCOUNTER — Other Ambulatory Visit: Payer: Self-pay | Admitting: Podiatry

## 2021-10-15 DIAGNOSIS — M7989 Other specified soft tissue disorders: Secondary | ICD-10-CM

## 2021-10-20 ENCOUNTER — Other Ambulatory Visit: Payer: Self-pay

## 2021-10-20 MED ORDER — ATORVASTATIN CALCIUM 40 MG PO TABS: ORAL_TABLET | ORAL | 2 refills | Status: DC

## 2021-12-24 ENCOUNTER — Telehealth (HOSPITAL_COMMUNITY): Payer: Self-pay | Admitting: *Deleted

## 2021-12-24 NOTE — Telephone Encounter (Signed)
Patient called in stating over the last several weeks he is having increasing PVCs according to his kardia device (confirmed by Dr. Graciela Husbands personally). Pt minimizing caffeine and is taking OTC magnesium. Pt will try using metoprolol daily prn and see if reduces frequency of PVCs.

## 2022-02-24 ENCOUNTER — Other Ambulatory Visit: Payer: Self-pay

## 2022-02-24 MED ORDER — ATORVASTATIN CALCIUM 40 MG PO TABS: ORAL_TABLET | ORAL | 4 refills | Status: DC

## 2022-03-22 ENCOUNTER — Other Ambulatory Visit: Payer: Self-pay | Admitting: Internal Medicine

## 2022-03-23 NOTE — Telephone Encounter (Signed)
Prescription refill request for Eliquis received. Indication:afib Last office visit:2/23 Scr:1.2 Age: 71 Weight:84.8  kg  Prescription refilled

## 2022-04-13 ENCOUNTER — Encounter (HOSPITAL_COMMUNITY): Payer: Self-pay

## 2022-04-13 ENCOUNTER — Encounter: Payer: Self-pay | Admitting: Internal Medicine

## 2022-04-14 ENCOUNTER — Encounter (HOSPITAL_COMMUNITY): Payer: Self-pay

## 2022-04-14 ENCOUNTER — Other Ambulatory Visit (HOSPITAL_COMMUNITY): Payer: Self-pay | Admitting: *Deleted

## 2022-04-14 DIAGNOSIS — I4819 Other persistent atrial fibrillation: Secondary | ICD-10-CM

## 2022-04-20 ENCOUNTER — Telehealth (HOSPITAL_COMMUNITY): Payer: Self-pay | Admitting: *Deleted

## 2022-04-20 NOTE — Telephone Encounter (Signed)
Patient back in NSR over weekend and continues today. Will cancel cardioversion that is scheduled. Pt will call if further issues.

## 2022-04-30 ENCOUNTER — Ambulatory Visit (HOSPITAL_COMMUNITY): Payer: Commercial Managed Care - PPO | Admitting: Nurse Practitioner

## 2022-05-05 ENCOUNTER — Ambulatory Visit (HOSPITAL_COMMUNITY): Payer: Commercial Managed Care - PPO | Admitting: Physician Assistant

## 2022-05-05 ENCOUNTER — Encounter (HOSPITAL_COMMUNITY): Payer: Self-pay

## 2022-05-05 ENCOUNTER — Ambulatory Visit (HOSPITAL_COMMUNITY): Admit: 2022-05-05 | Payer: Commercial Managed Care - PPO | Admitting: Internal Medicine

## 2022-05-05 SURGERY — CARDIOVERSION
Anesthesia: General

## 2022-05-07 ENCOUNTER — Ambulatory Visit (HOSPITAL_COMMUNITY): Payer: Commercial Managed Care - PPO | Admitting: Physician Assistant

## 2022-05-20 ENCOUNTER — Other Ambulatory Visit: Payer: Self-pay | Admitting: Urology

## 2022-05-20 ENCOUNTER — Ambulatory Visit (HOSPITAL_COMMUNITY): Payer: Commercial Managed Care - PPO | Admitting: Physician Assistant

## 2022-05-20 DIAGNOSIS — R972 Elevated prostate specific antigen [PSA]: Secondary | ICD-10-CM

## 2022-05-28 ENCOUNTER — Telehealth (HOSPITAL_COMMUNITY): Payer: Self-pay | Admitting: *Deleted

## 2022-05-28 ENCOUNTER — Encounter (HOSPITAL_COMMUNITY): Payer: Self-pay

## 2022-05-28 NOTE — Telephone Encounter (Signed)
Pt called back in afib since end of last week confirmed by Jodelle Red. DCCV scheduled - H&P appt made prior to appt. Pt in agreement.

## 2022-05-29 ENCOUNTER — Other Ambulatory Visit: Payer: Self-pay | Admitting: Internal Medicine

## 2022-05-29 ENCOUNTER — Other Ambulatory Visit (HOSPITAL_COMMUNITY): Payer: Self-pay | Admitting: *Deleted

## 2022-05-29 DIAGNOSIS — I4819 Other persistent atrial fibrillation: Secondary | ICD-10-CM

## 2022-05-29 MED ORDER — APIXABAN 5 MG PO TABS: ORAL_TABLET | ORAL | 1 refills | Status: DC

## 2022-05-29 NOTE — Telephone Encounter (Signed)
Prescription refill request for Eliquis received. Indication: Afib  Last office visit:09/15/21 ([place])  Scr: 1.26 (10/02/21)  Age: 72 Weight: 84.8kg  Appropriate dose. Refill sent.

## 2022-06-09 ENCOUNTER — Encounter (HOSPITAL_COMMUNITY): Payer: Self-pay | Admitting: Physician Assistant

## 2022-06-09 ENCOUNTER — Ambulatory Visit (HOSPITAL_COMMUNITY)
Admission: RE | Admit: 2022-06-09 | Discharge: 2022-06-09 | Disposition: A | Discharge status: Home / Self Care | Payer: Medicare Other | Source: Ambulatory Visit | Attending: Physician Assistant | Admitting: Physician Assistant

## 2022-06-09 VITALS — BP 134/94 | HR 101 | Ht 73.0 in | Wt 192.6 lb

## 2022-06-09 DIAGNOSIS — Z7901 Long term (current) use of anticoagulants: Secondary | ICD-10-CM | POA: Diagnosis not present

## 2022-06-09 DIAGNOSIS — I4892 Unspecified atrial flutter: Secondary | ICD-10-CM | POA: Insufficient documentation

## 2022-06-09 DIAGNOSIS — G4733 Obstructive sleep apnea (adult) (pediatric): Secondary | ICD-10-CM | POA: Insufficient documentation

## 2022-06-09 DIAGNOSIS — I5022 Chronic systolic (congestive) heart failure: Secondary | ICD-10-CM | POA: Diagnosis not present

## 2022-06-09 DIAGNOSIS — Z79899 Other long term (current) drug therapy: Secondary | ICD-10-CM | POA: Insufficient documentation

## 2022-06-09 DIAGNOSIS — D6869 Other thrombophilia: Secondary | ICD-10-CM

## 2022-06-09 DIAGNOSIS — E785 Hyperlipidemia, unspecified: Secondary | ICD-10-CM | POA: Diagnosis not present

## 2022-06-09 DIAGNOSIS — Z006 Encounter for examination for normal comparison and control in clinical research program: Secondary | ICD-10-CM

## 2022-06-09 DIAGNOSIS — I2510 Atherosclerotic heart disease of native coronary artery without angina pectoris: Secondary | ICD-10-CM | POA: Insufficient documentation

## 2022-06-09 DIAGNOSIS — I4819 Other persistent atrial fibrillation: Secondary | ICD-10-CM | POA: Diagnosis present

## 2022-06-09 LAB — BASIC METABOLIC PANEL
Anion gap: 10 (ref 5–15)
BUN: 23 mg/dL (ref 8–23)
CO2: 22 mmol/L (ref 22–32)
Calcium: 9.7 mg/dL (ref 8.9–10.3)
Chloride: 110 mmol/L (ref 98–111)
Creatinine, Ser: 1.3 mg/dL — ABNORMAL HIGH (ref 0.61–1.24)
GFR, Estimated: 59 mL/min — ABNORMAL LOW (ref >60)
Glucose, Bld: 69 mg/dL — ABNORMAL LOW (ref 70–99)
Potassium: 4.4 mmol/L (ref 3.5–5.1)
Sodium: 142 mmol/L (ref 135–145)

## 2022-06-09 LAB — CBC
HCT: 46.4 % (ref 39.0–52.0)
Hemoglobin: 15.7 g/dL (ref 13.0–17.0)
MCH: 34.1 pg — ABNORMAL HIGH (ref 26.0–34.0)
MCHC: 33.8 g/dL (ref 30.0–36.0)
MCV: 100.9 fL — ABNORMAL HIGH (ref 80.0–100.0)
Platelets: 163 10*3/uL (ref 150–400)
RBC: 4.6 MIL/uL (ref 4.22–5.81)
RDW: 12 % (ref 11.5–15.5)
WBC: 10 10*3/uL (ref 4.0–10.5)
nRBC: 0 % (ref 0.0–0.2)

## 2022-06-09 NOTE — Research (Signed)
[facility] Informed Consent   Subject Name: Steven Gray [facility]  Subject met inclusion and exclusion criteria.  The informed consent form, study requirements and expectations were reviewed with the subject and questions and concerns were addressed prior to the signing of the consent form.  The subject verbalized understanding of the trial requirements.  The subject agreed to participate in the [facility] trial and signed the informed consent on 6/Feb/2024.  The informed consent was obtained prior to performance of any protocol-specific procedures for the subject.  A copy of the signed informed consent was given to the subject and a copy was placed in the subject's medical record.   Tori Milks [name]

## 2022-06-09 NOTE — H&P (View-Only) (Signed)
Primary Care Physician: Burnard Bunting, MD Primary Electrophysiologist: Dr Caryl Comes Referring Physician: Tommye Standard   Steven Gray is a 72 y.o. male with a history of HLD, OSA, persistent atrial fibrillation, systolic CHF, CAD who presents for follow up in the [facility].  The patient was initially diagnosed with atrial fibrillation 06/03/19 after presenting to the ER with a 56-monthonset of worsening dyspnea, orthopnea and PND.  Patient was previously seen by Dr. CBurt Knackin 2014 for palpitation.  ETT at the time was low risk.  Heart monitor showed sinus rhythm with PAC and PVC, otherwise no significant arrhythmia. [facility], he was noted to be in new atrial fibrillation with RVR with heart rate of 110-120s. Echo showed reduced EF 30-35% with a severely dilated LA. Patient was started on Eliquis for a CHADS2VASC score of 2. Plan was for patient to undergo TEE/DCCV but TEE showed dense smoke and LA thrombus and so DCCV was not attempted. He was discharged on Toprol and digoxin for rate control. Patient underwent TEE guided DCCV on 07/04/19 which was successful. Repeat echo and cardiac MRI showed normalized EF with severe biatrial enlargement. Patient had done well until the week of 11/20/19 when his smart watch again showed afib. He reports good rate control and has been asymptomatic. Patient is s/p DCCV on 11/30/19. Patient is s/p afib ablation with Dr ARayann Hemanon 05/14/20 but unfortunately had recurrent persistent afib. He had a DCCV on 11/06/20 and underwent repeat ablation on 12/03/20.  On follow up today, patient reports that he has been persistently in afib for the past two weeks. He does note that his afib typically starts while eating. There were no other specific triggers that he could identify. No bleeding issues on anticoagulation.   Today, he denies symptoms of palpitations, chest pain, orthopnea, PND, lower extremity edema,  dizziness, presyncope, syncope, bleeding, or neurologic sequela. The patient is tolerating medications without difficulties and is otherwise without complaint today.    Atrial Fibrillation Risk Factors:  he does have symptoms or diagnosis of sleep apnea. he is compliant with CPAP therapy. he does not have a history of rheumatic fever. he does have a history of alcohol use. The patient does not have a history of early familial atrial fibrillation or other arrhythmias.  he has a BMI of Body mass index is 25.41 kg/m..Marland KitchenFiled Weights   06/09/22 0927  Weight: 87.4 kg    Family History  Problem Relation Age of Onset   Polycystic kidney disease Mother    Vascular Disease Father        vascular dementia   CAD Maternal Grandfather    Atrial fibrillation Neg Hx      Atrial Fibrillation Management history:  Previous antiarrhythmic drugs: none Previous cardioversions: 07/04/19, 11/30/19, 11/06/20 Previous ablations: 05/14/20, 12/03/20 CHADS2VASC score: 2 Anticoagulation history: Eliquis   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   Hyperlipidemia    does not take medication for this, denies   Increased prostate specific antigen (PSA) velocity    Left atrial enlargement    [place]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/03[place]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   Persistent atrial fibrillation ([place] 06/03/[place]   Right knee pain 12/07/2016   Sleep apnea    wears CPAP   Past Surgical History:  Procedure Laterality Date   ATRIAL FIBRILLATION  ABLATION N/A 05/14/2020   Procedure: ATRIAL FIBRILLATION ABLATION;  Surgeon: Thompson Grayer, MD;  Location: [place] [facility];  Service: Cardiovascular;  Laterality: N/A;   ATRIAL FIBRILLATION ABLATION N/A 12/03/2020   Procedure: ATRIAL FIBRILLATION ABLATION;  Surgeon: Thompson Grayer, MD;  Location: [place] [facility];  Service: Cardiovascular;  Laterality: N/A;   CARDIOVERSION N/A  07/04/2019   Procedure: CARDIOVERSION;  Surgeon: Sueanne Margarita, MD;  Location: [place];  Service: Cardiovascular;  Laterality: N/A;   CARDIOVERSION N/A 11/30/2019   Procedure: CARDIOVERSION;  Surgeon: Lelon Perla, MD;  Location: [facility] ENDOSCOPY;  Service: Cardiovascular;  Laterality: N/A;   CARDIOVERSION N/A 04/02/2020   Procedure: CARDIOVERSION;  Surgeon: Skeet Latch, MD;  Location: [place];  Service: Cardiovascular;  Laterality: N/A;   CARDIOVERSION N/A 10/02/2020   Procedure: CARDIOVERSION;  Surgeon: Minna Merritts, MD;  Location: [place] [facility];  Service: Cardiovascular;  Laterality: N/A;   CARDIOVERSION N/A 11/06/2020   Procedure: CARDIOVERSION;  Surgeon: Donato Heinz, MD;  Location: [place];  Service: Cardiovascular;  Laterality: N/A;   CARDIOVERSION N/A 09/18/2021   Procedure: CARDIOVERSION;  Surgeon: Lelon Perla, MD;  Location[facility];  Service: Cardiovascular;  Laterality: N/A;   MENISCUS REPAIR     TEE WITHOUT CARDIOVERSION N/A 06/05/2019   Procedure: TRANSESOPHAGEAL ECHOCARDIOGRAM (TEE);  Surgeon: Josue Hector, MD;  Location: [facility] ENDOSCOPY;  Service: Cardiovascular;  Laterality: N/A;   TEE WITHOUT CARDIOVERSION N/A 07/04/2019   Procedure: TRANSESOPHAGEAL ECHOCARDIOGRAM (TEE);  Surgeon: Sueanne Margarita, MD;  Location: [facility] ENDOSCOPY;  Service: Cardiovascular;  Laterality: N/A;    Current Outpatient Medications  Medication Sig Dispense Refill   apixaban (ELIQUIS) 5 MG TABS tablet TAKE 1 TABLET(5 MG) BY MOUTH TWICE DAILY 180 tablet 1   atorvastatin (LIPITOR) 40 MG tablet TAKE 1 TABLET(40 MG) BY MOUTH DAILY AT 6 PM. 30 tablet 4   losartan (COZAAR) 25 MG tablet TAKE 1 TABLET(25 MG) BY MOUTH DAILY 60 tablet 0   MAGNESIUM PO Take 1 tablet by mouth daily.     metoprolol succinate (TOPROL XL) 25 MG 24 hr tablet Taking only as needed for A-fib episodes     ORAL ELECTROLYTES PO Take 1 Scoop by mouth daily. Electrolyte multivitamin powder     zolpidem  (AMBIEN) 10 MG tablet Take 2.5-5 mg by mouth at bedtime as needed for sleep.     No current facility-administered medications for this encounter.    No Known Allergies  Social History   Socioeconomic History   Marital status: Married    Spouse name: Massie Maroon    Number of children: 2   Years of education: Not on file   Highest education level: Not on file  Occupational History   Occupation: warehousing  Tobacco Use   Smoking status: Never   Smokeless tobacco: Never   Tobacco comments:    Never smoke 06/09/22  Substance and Sexual Activity   Alcohol use: Yes    Comment: he reports a couple glasses of wine   Drug use: No   Sexual activity: Not on file  Other Topics Concern   Not on file  Social History Narrative   Married with 2 sons, nonsmoker, social ETOH, works as Programme researcher, broadcasting/film/video in [employer], prison and probation services. Frequent exercise.   Social Determinants of Health   Financial Resource Strain: Not on file  Food Insecurity: Not on file  Transportation Needs: Not on file  Physical Activity: Not on file  Stress: Not on file  Social Connections: Not on file  Intimate  Partner Violence: Not on file     ROS- All systems are reviewed and negative except as per the HPI above.  Physical Exam: Vitals:   06/09/22 0927  BP: (!) 134/94  Pulse: (!) 101  Weight: 87.4 kg  Height: 6' 1"$  (1.854 m)     GEN- The patient is a well appearing male, alert and oriented x 3 today.   HEENT-head normocephalic, atraumatic, sclera clear, conjunctiva pink, hearing intact, trachea midline. Lungs- Clear to ausculation bilaterally, normal work of breathing Heart- irregular rate and rhythm, no murmurs, rubs or gallops  GI- soft, NT, ND, + BS Extremities- no clubbing, cyanosis, or edema MS- no significant deformity or atrophy Skin- no rash or lesion Psych- euthymic mood, full affect Neuro- strength and sensation are intact   Wt Readings from Last 3 Encounters:  06/09/22 87.4 kg  09/18/21 84.8 kg  09/15/21  87.3 kg    EKG today demonstrates  Afib Vent. rate 101 BPM PR interval * ms QRS duration 92 ms QT/QTcB 358/464 ms  Echo 08/28/19 demonstrated  1. Global longitudinal strain is -18.4%  COmpared to echo report in Jan 2021, LVEF is improved . Left  ventricular ejection fraction, by estimation, is 60 to 65%. The left  ventricle has normal function. The left ventricle has no regional wall  motion abnormalities. There is mild left ventricular hypertrophy. Left ventricular diastolic parameters are consistent with Grade I diastolic dysfunction (impaired relaxation).   2. Right ventricular systolic function is normal. The right ventricular  size is mildly enlarged. There is mildly elevated pulmonary artery  systolic pressure.   3. Left atrial size was severely dilated.   4. Right atrial size was severely dilated.   5. The mitral valve is normal in structure. Trivial mitral valve  regurgitation.   6. The aortic valve is normal in structure. Aortic valve regurgitation is  not visualized.   7. The inferior vena cava is dilated in size with >50% respiratory  variability, suggesting right atrial pressure of 8 mmHg.   Epic records are reviewed at length today  CHA2DS2-VASc Score = 2  The patient's score is based upon: CHF History: 0 (EF normalized) HTN History: 0 Diabetes History: 0 Stroke History: 0 Vascular Disease History: 1 Age Score: 1 Gender Score: 0       ASSESSMENT AND PLAN: 1. Persistent Atrial Fibrillation/atrial flutter The patient's CHA2DS2-VASc score is 2, indicating a 2.2% annual risk of stroke.   S/p afib ablation 05/14/20 with repeat ablation 12/03/20 Patient in persistent afib today. DCCV scheduled for 06/12/22. We discussed long term rhythm control strategies including dofetilide, 3rd ablation, or convergent. He would like to discuss his options with Dr Caryl Comes.  Continue Eliquis 5 mg BID Continue Toprol 25 mg daily PRN for afib episodes.  Fitbit for home monitoring.    2. Secondary Hypercoagulable State (ICD10:  D68.69) The patient is at significant risk for stroke/thromboembolism based upon his CHA2DS2-VASc Score of 2.  Continue Apixaban (Eliquis).     3. Obstructive sleep apnea Encouraged compliance with CPAP therapy.  4. Systolic dysfunction Resolved with SR. Fluid status appear stable today.  5. CAD CAC score 8 82nd percentile  On statin Low risk stress test 06/04/20 No anginal symptoms.   Follow up with Dr Caryl Comes post DCCV.    [facility] [address] [place], [place] [address] [phone] 06/09/2022 9:57 AM

## 2022-06-09 NOTE — Progress Notes (Signed)
Primary Care Physician: Burnard Bunting, MD Primary Electrophysiologist: Dr Caryl Comes Referring Physician: Tommye Standard   Steven Gray is a 72 y.o. male with a history of HLD, OSA, persistent atrial fibrillation, systolic CHF, CAD who presents for follow up in the [facility].  The patient was initially diagnosed with atrial fibrillation 06/03/19 after presenting to the ER with a 56-monthonset of worsening dyspnea, orthopnea and PND.  Patient was previously seen by Dr. CBurt Knackin 2014 for palpitation.  ETT at the time was low risk.  Heart monitor showed sinus rhythm with PAC and PVC, otherwise no significant arrhythmia. [facility] he was noted to be in new atrial fibrillation with RVR with heart rate of 110-120s. Echo showed reduced EF 30-35% with a severely dilated LA. Patient was started on Eliquis for a CHADS2VASC score of 2. Plan was for patient to undergo TEE/DCCV but TEE showed dense smoke and LA thrombus and so DCCV was not attempted. He was discharged on Toprol and digoxin for rate control. Patient underwent TEE guided DCCV on 07/04/19 which was successful. Repeat echo and cardiac MRI showed normalized EF with severe biatrial enlargement. Patient had done well until the week of 11/20/19 when his smart watch again showed afib. He reports good rate control and has been asymptomatic. Patient is s/p DCCV on 11/30/19. Patient is s/p afib ablation with Dr ARayann Hemanon 05/14/20 but unfortunately had recurrent persistent afib. He had a DCCV on 11/06/20 and underwent repeat ablation on 12/03/20.  On follow up today, patient reports that he has been persistently in afib for the past two weeks. He does note that his afib typically starts while eating. There were no other specific triggers that he could identify. No bleeding issues on anticoagulation.   Today, he denies symptoms of palpitations, chest pain, orthopnea, PND, lower extremity edema,  dizziness, presyncope, syncope, bleeding, or neurologic sequela. The patient is tolerating medications without difficulties and is otherwise without complaint today.    Atrial Fibrillation Risk Factors:  he does have symptoms or diagnosis of sleep apnea. he is compliant with CPAP therapy. he does not have a history of rheumatic fever. he does have a history of alcohol use. The patient does not have a history of early familial atrial fibrillation or other arrhythmias.  he has a BMI of Body mass index is 25.41 kg/m..Marland KitchenFiled Weights   06/09/22 0927  Weight: 87.4 kg    Family History  Problem Relation Age of Onset   Polycystic kidney disease Mother    Vascular Disease Father        vascular dementia   CAD Maternal Grandfather    Atrial fibrillation Neg Hx      Atrial Fibrillation Management history:  Previous antiarrhythmic drugs: none Previous cardioversions: 07/04/19, 11/30/19, 11/06/20 Previous ablations: 05/14/20, 12/03/20 CHADS2VASC score: 2 Anticoagulation history: Eliquis   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   Hyperlipidemia    does not take medication for this, denies   Increased prostate specific antigen (PSA) velocity    Left atrial enlargement    [place]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/03[place]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   Persistent [place] ([place] 06/03/2019   Right knee pain 12/07/2016   Sleep apnea    wears CPAP   Past Surgical History:  Procedure Laterality Date   ATRIAL FIBRILLATION  ABLATION N/A 05/14/2020   Procedure: ATRIAL FIBRILLATION ABLATION;  Surgeon: Thompson Grayer, MD;  Location: [place] [facility];  Service: Cardiovascular;  Laterality: N/A;   ATRIAL FIBRILLATION ABLATION N/A 12/03/2020   Procedure: ATRIAL FIBRILLATION ABLATION;  Surgeon: Thompson Grayer, MD;  Location: [place] [facility];  Service: Cardiovascular;  Laterality: N/A;   CARDIOVERSION N/A  07/04/2019   Procedure: CARDIOVERSION;  Surgeon: Sueanne Margarita, MD;  Location: [place];  Service: Cardiovascular;  Laterality: N/A;   CARDIOVERSION N/A 11/30/2019   Procedure: CARDIOVERSION;  Surgeon: Lelon Perla, MD;  Location: [facility] ENDOSCOPY;  Service: Cardiovascular;  Laterality: N/A;   CARDIOVERSION N/A 04/02/2020   Procedure: CARDIOVERSION;  Surgeon: Skeet Latch, MD;  Location: [place];  Service: Cardiovascular;  Laterality: N/A;   CARDIOVERSION N/A 10/02/2020   Procedure: CARDIOVERSION;  Surgeon: Minna Merritts, MD;  Location: [place] [facility];  Service: Cardiovascular;  Laterality: N/A;   CARDIOVERSION N/A 11/06/2020   Procedure: CARDIOVERSION;  Surgeon: Donato Heinz, MD;  Location: [place];  Service: Cardiovascular;  Laterality: N/A;   CARDIOVERSION N/A 09/18/2021   Procedure: CARDIOVERSION;  Surgeon: Lelon Perla, MD;  Location: [facility];  Service: Cardiovascular;  Laterality: N/A;   MENISCUS REPAIR     TEE WITHOUT CARDIOVERSION N/A 06/05/2019   Procedure: TRANSESOPHAGEAL ECHOCARDIOGRAM (TEE);  Surgeon: Josue Hector, MD;  Location: [facility] ENDOSCOPY;  Service: Cardiovascular;  Laterality: N/A;   TEE WITHOUT CARDIOVERSION N/A 07/04/2019   Procedure: TRANSESOPHAGEAL ECHOCARDIOGRAM (TEE);  Surgeon: Sueanne Margarita, MD;  Location: [facility] ENDOSCOPY;  Service: Cardiovascular;  Laterality: N/A;    Current Outpatient Medications  Medication Sig Dispense Refill   apixaban (ELIQUIS) 5 MG TABS tablet TAKE 1 TABLET(5 MG) BY MOUTH TWICE DAILY 180 tablet 1   atorvastatin (LIPITOR) 40 MG tablet TAKE 1 TABLET(40 MG) BY MOUTH DAILY AT 6 PM. 30 tablet 4   losartan (COZAAR) 25 MG tablet TAKE 1 TABLET(25 MG) BY MOUTH DAILY 60 tablet 0   MAGNESIUM PO Take 1 tablet by mouth daily.     metoprolol succinate (TOPROL XL) 25 MG 24 hr tablet Taking only as needed for A-fib episodes     ORAL ELECTROLYTES PO Take 1 Scoop by mouth daily. Electrolyte multivitamin powder     zolpidem  (AMBIEN) 10 MG tablet Take 2.5-5 mg by mouth at bedtime as needed for sleep.     No current facility-administered medications for this encounter.    No Known Allergies  Social History   Socioeconomic History   Marital status: Married    Spouse name: Massie Maroon    Number of children: 2   Years of education: Not on file   Highest education level: Not on file  Occupational History   Occupation: warehousing  Tobacco Use   Smoking status: Never   Smokeless tobacco: Never   Tobacco comments:    Never smoke 06/09/22  Substance and Sexual Activity   Alcohol use: Yes    Comment: he reports a couple glasses of wine   Drug use: No   Sexual activity: Not on file  Other Topics Concern   Not on file  Social History Narrative   Married with 2 sons, nonsmoker, social ETOH, works as Programme researcher, broadcasting/film/video in [employer], prison and probation services. Frequent exercise.   Social Determinants of Health   Financial Resource Strain: Not on file  Food Insecurity: Not on file  Transportation Needs: Not on file  Physical Activity: Not on file  Stress: Not on file  Social Connections: Not on file  Intimate  Partner Violence: Not on file     ROS- All systems are reviewed and negative except as per the HPI above.  Physical Exam: Vitals:   06/09/22 0927  BP: (!) 134/94  Pulse: (!) 101  Weight: 87.4 kg  Height: 6' 1"$  (1.854 m)     GEN- The patient is a well appearing male, alert and oriented x 3 today.   HEENT-head normocephalic, atraumatic, sclera clear, conjunctiva pink, hearing intact, trachea midline. Lungs- Clear to ausculation bilaterally, normal work of breathing Heart- irregular rate and rhythm, no murmurs, rubs or gallops  GI- soft, NT, ND, + BS Extremities- no clubbing, cyanosis, or edema MS- no significant deformity or atrophy Skin- no rash or lesion Psych- euthymic mood, full affect Neuro- strength and sensation are intact   Wt Readings from Last 3 Encounters:  06/09/22 87.4 kg  09/18/21 84.8 kg  09/15/21  87.3 kg    EKG today demonstrates  Afib Vent. rate 101 BPM PR interval * ms QRS duration 92 ms QT/QTcB 358/464 ms  Echo 08/28/19 demonstrated  1. Global longitudinal strain is -18.4%  COmpared to echo report in Jan 2021, LVEF is improved . Left  ventricular ejection fraction, by estimation, is 60 to 65%. The left  ventricle has normal function. The left ventricle has no regional wall  motion abnormalities. There is mild left ventricular hypertrophy. Left ventricular diastolic parameters are consistent with Grade I diastolic dysfunction (impaired relaxation).   2. Right ventricular systolic function is normal. The right ventricular  size is mildly enlarged. There is mildly elevated pulmonary artery  systolic pressure.   3. Left atrial size was severely dilated.   4. Right atrial size was severely dilated.   5. The mitral valve is normal in structure. Trivial mitral valve  regurgitation.   6. The aortic valve is normal in structure. Aortic valve regurgitation is  not visualized.   7. The inferior vena cava is dilated in size with >50% respiratory  variability, suggesting right atrial pressure of 8 mmHg.   Epic records are reviewed at length today  CHA2DS2-VASc Score = 2  The patient's score is based upon: CHF History: 0 (EF normalized) HTN History: 0 Diabetes History: 0 Stroke History: 0 Vascular Disease History: 1 Age Score: 1 Gender Score: 0       ASSESSMENT AND PLAN: 1. Persistent Atrial Fibrillation/atrial flutter The patient's CHA2DS2-VASc score is 2, indicating a 2.2% annual risk of stroke.   S/p afib ablation 05/14/20 with repeat ablation 12/03/20 Patient in persistent afib today. DCCV scheduled for 06/12/22. We discussed long term rhythm control strategies including dofetilide, 3rd ablation, or convergent. He would like to discuss his options with Dr Caryl Comes.  Continue Eliquis 5 mg BID Continue Toprol 25 mg daily PRN for afib episodes.  Fitbit for home monitoring.    2. Secondary Hypercoagulable State (ICD10:  D68.69) The patient is at significant risk for stroke/thromboembolism based upon his CHA2DS2-VASc Score of 2.  Continue Apixaban (Eliquis).     3. Obstructive sleep apnea Encouraged compliance with CPAP therapy.  4. Systolic dysfunction Resolved with SR. Fluid status appear stable today.  5. CAD CAC score 8 82nd percentile  On statin Low risk stress test 06/04/20 No anginal symptoms.   Follow up with Dr Caryl Comes post DCCV.    [facility] [address] [place], [place] [address] [phone] 06/09/2022 9:57 AM

## 2022-06-11 ENCOUNTER — Encounter (HOSPITAL_COMMUNITY): Payer: Self-pay | Admitting: *Deleted

## 2022-06-12 ENCOUNTER — Encounter (HOSPITAL_COMMUNITY): Payer: Self-pay | Admitting: Cardiology

## 2022-06-12 ENCOUNTER — Encounter (HOSPITAL_COMMUNITY)
Admission: RE | Disposition: A | Discharge status: Home / Self Care | Payer: Self-pay | Source: Home / Self Care | Attending: Cardiology

## 2022-06-12 ENCOUNTER — Ambulatory Visit (HOSPITAL_BASED_OUTPATIENT_CLINIC_OR_DEPARTMENT_OTHER): Payer: Medicare Other | Admitting: Certified Registered"

## 2022-06-12 ENCOUNTER — Other Ambulatory Visit: Payer: Self-pay

## 2022-06-12 ENCOUNTER — Ambulatory Visit (HOSPITAL_COMMUNITY)
Admission: RE | Admit: 2022-06-12 | Discharge: 2022-06-12 | Disposition: A | Discharge status: Home / Self Care | Payer: Medicare Other | Attending: Cardiology | Admitting: Cardiology

## 2022-06-12 ENCOUNTER — Ambulatory Visit (HOSPITAL_COMMUNITY): Payer: Medicare Other | Admitting: Certified Registered"

## 2022-06-12 DIAGNOSIS — I4891 Unspecified atrial fibrillation: Secondary | ICD-10-CM | POA: Diagnosis not present

## 2022-06-12 DIAGNOSIS — I5020 Unspecified systolic (congestive) heart failure: Secondary | ICD-10-CM | POA: Diagnosis not present

## 2022-06-12 DIAGNOSIS — I4819 Other persistent atrial fibrillation: Secondary | ICD-10-CM | POA: Diagnosis not present

## 2022-06-12 DIAGNOSIS — E785 Hyperlipidemia, unspecified: Secondary | ICD-10-CM | POA: Diagnosis not present

## 2022-06-12 DIAGNOSIS — G4733 Obstructive sleep apnea (adult) (pediatric): Secondary | ICD-10-CM | POA: Insufficient documentation

## 2022-06-12 DIAGNOSIS — G4730 Sleep apnea, unspecified: Secondary | ICD-10-CM

## 2022-06-12 DIAGNOSIS — I2510 Atherosclerotic heart disease of native coronary artery without angina pectoris: Secondary | ICD-10-CM | POA: Insufficient documentation

## 2022-06-12 DIAGNOSIS — I110 Hypertensive heart disease with heart failure: Secondary | ICD-10-CM | POA: Diagnosis not present

## 2022-06-12 DIAGNOSIS — I509 Heart failure, unspecified: Secondary | ICD-10-CM | POA: Diagnosis not present

## 2022-06-12 HISTORY — PX: CARDIOVERSION: SHX1299

## 2022-06-12 SURGERY — CARDIOVERSION
Anesthesia: General

## 2022-06-12 MED ORDER — SODIUM CHLORIDE 0.9 % IV SOLN: INTRAVENOUS | Status: DC

## 2022-06-12 MED ORDER — PROPOFOL 10 MG/ML IV BOLUS
INTRAVENOUS | Status: DC | PRN
  Administered 2022-06-12: 90 mg via INTRAVENOUS

## 2022-06-12 MED ORDER — SODIUM CHLORIDE 0.9 % IV SOLN
INTRAVENOUS | Status: AC | PRN
  Administered 2022-06-12: 500 mL via INTRAVENOUS

## 2022-06-12 MED ORDER — LIDOCAINE 2% (20 MG/ML) 5 ML SYRINGE
INTRAMUSCULAR | Status: DC | PRN
  Administered 2022-06-12: 100 mg via INTRAVENOUS

## 2022-06-12 NOTE — Anesthesia Postprocedure Evaluation (Signed)
Anesthesia Post Note  Patient: Steven Gray  Procedure(s) Performed: CARDIOVERSION     Patient location during evaluation: PACU Anesthesia Type: General Level of consciousness: awake and alert Pain management: pain level controlled Vital Signs Assessment: post-procedure vital signs reviewed and stable Respiratory status: spontaneous breathing, nonlabored ventilation and respiratory function stable Cardiovascular status: blood pressure returned to baseline and stable Postop Assessment: no apparent nausea or vomiting Anesthetic complications: no   No notable events documented.  Last Vitals:  Vitals:   06/12/22 1125 06/12/22 1135  BP: 106/81 (!) 123/96  Pulse: 61 63  Resp: 18 18  Temp:    SpO2: 97% 98%    Last Pain:  Vitals:   06/12/22 1135  PainSc: 0-No pain                 Lynda Rainwater

## 2022-06-12 NOTE — Discharge Instructions (Signed)

## 2022-06-12 NOTE — Anesthesia Procedure Notes (Signed)
Procedure Name: General with mask airway Date/Time: 06/12/2022 11:04 AM  Performed by: Amadeo Garnet, CRNAPre-anesthesia Checklist: Patient identified, Emergency Drugs available, Suction available and Patient being monitored Patient Re-evaluated:Patient Re-evaluated prior to induction Oxygen Delivery Method: Ambu bag Preoxygenation: Pre-oxygenation with 100% oxygen Induction Type: IV induction Placement Confirmation: positive ETCO2 Dental Injury: Teeth and Oropharynx as per pre-operative assessment

## 2022-06-12 NOTE — Transfer of Care (Signed)
Immediate Anesthesia Transfer of Care Note  Patient: Shaurya Hariri Pavelko  Procedure(s) Performed: CARDIOVERSION  Patient Location: Endoscopy Unit  Anesthesia Type:General  Level of Consciousness: drowsy and patient cooperative  Airway & Oxygen Therapy: Patient Spontanous Breathing and Patient connected to nasal cannula oxygen  Post-op Assessment: Report given to RN, Post -op Vital signs reviewed and stable, and Patient moving all extremities  Post vital signs: Reviewed and stable  Last Vitals:  Vitals Value Taken Time  BP    Temp    Pulse    Resp    SpO2      Last Pain:  Vitals:   06/12/22 1000  PainSc: 0-No pain         Complications: No notable events documented.

## 2022-06-12 NOTE — Interval H&P Note (Signed)
History and Physical Interval Note:  06/12/2022 10:05 AM  Steven Gray  has presented today for surgery, with the diagnosis of AFIB.  The various methods of treatment have been discussed with the patient and family. After consideration of risks, benefits and other options for treatment, the patient has consented to  Procedure(s): CARDIOVERSION (N/A) as a surgical intervention.  The patient's history has been reviewed, patient examined, no change in status, stable for surgery.  I have reviewed the patient's chart and labs.  Questions were answered to the patient's satisfaction.     Deward Sebek Harrell Gave

## 2022-06-12 NOTE — CV Procedure (Signed)
Procedure:   DCCV  Indication:  Symptomatic atrial fibrillation  Procedure Note:  The patient signed informed consent.  They have had had therapeutic anticoagulation with apixaban greater than 3 weeks.  Anesthesia was administered by Dr. Sabra Heck.  Patient received 100 mg IV lidocaine and 90 mg IV propofol.Adequate airway was maintained throughout and vital followed per protocol.  They were cardioverted x 1 with 120J of biphasic synchronized energy.  They converted to NSR.  There were no apparent complications.  The patient had normal neuro status and respiratory status post procedure with vitals stable as recorded elsewhere.    Follow up:  They will continue on current medical therapy and follow up with cardiology as scheduled.  Buford Dresser, MD PhD 06/12/2022 11:13 AM

## 2022-06-12 NOTE — Anesthesia Preprocedure Evaluation (Signed)
Anesthesia Evaluation  Patient identified by MRN, date of birth, ID band Patient awake    Reviewed: Allergy & Precautions, NPO status , Patient's Chart, lab work & pertinent test results, reviewed documented beta blocker date and time   History of Anesthesia Complications Negative for: history of anesthetic complications  Airway Mallampati: I  TM Distance: >3 FB Neck ROM: Full    Dental  (+) Dental Advisory Given   Pulmonary sleep apnea    breath sounds clear to auscultation       Cardiovascular hypertension, Pt. on medications and Pt. on home beta blockers +CHF  + dysrhythmias Atrial Fibrillation  Rhythm:Irregular Rate:Normal  1. Global longitudinal strain is -18.4%   COmpared to echo report in Jan 2021, LVEF is improved . Left  ventricular ejection fraction, by estimation, is 60 to 65%. The left  ventricle has normal function. The left ventricle has no regional wall  motion abnormalities. There is mild left  ventricular hypertrophy. Left ventricular diastolic parameters are  consistent with Grade I diastolic dysfunction (impaired relaxation).  2. Right ventricular systolic function is normal. The right ventricular  size is mildly enlarged. There is mildly elevated pulmonary artery  systolic pressure.  3. Left atrial size was severely dilated.  4. Right atrial size was severely dilated.  5. The mitral valve is normal in structure. Trivial mitral valve  regurgitation.  6. The aortic valve is normal in structure. Aortic valve regurgitation is  not visualized.  7. The inferior vena cava is dilated in size with >50% respiratory  variability, suggesting right atrial pressure of 8 mmHg.   ? Nuclear stress EF: 53%. ? There was no ST segment deviation noted during stress. ? The study is normal. ? This is a low risk study. ? The left ventricular ejection fraction is mildly decreased (45-54%).   No evidence of ischemia.  EF measures low normal but appears normal visually without significant wall motion abnormalities. Of note, patient did become hypotensive with regadenoso.    Neuro/Psych negative neurological ROS  negative psych ROS   GI/Hepatic negative GI ROS, Neg liver ROS,,,  Endo/Other  negative endocrine ROS    Renal/GU negative Renal ROS  negative genitourinary   Musculoskeletal negative musculoskeletal ROS (+)    Abdominal Normal abdominal exam  (+)   Peds  Hematology eliquis  Lab Results      Component                Value               Date                      WBC                      7.2                 11/12/2020                HGB                      14.9                11/12/2020                HCT                      44.4  11/12/2020                MCV                      97                  11/12/2020                PLT                      174                 11/12/2020              Anesthesia Other Findings   Reproductive/Obstetrics                             Anesthesia Physical Anesthesia Plan  ASA: 3  Anesthesia Plan: General   Post-op Pain Management:    Induction:   PONV Risk Score and Plan: 2 and Propofol infusion and TIVA  Airway Management Planned: Natural Airway and Simple Face Mask  Additional Equipment: None  Intra-op Plan:   Post-operative Plan: Extubation in OR  Informed Consent: I have reviewed the patients History and Physical, chart, labs and discussed the procedure including the risks, benefits and alternatives for the proposed anesthesia with the patient or authorized representative who has indicated his/her understanding and acceptance.       Plan Discussed with: CRNA  Anesthesia Plan Comments:         Anesthesia Quick Evaluation

## 2022-06-15 ENCOUNTER — Encounter (HOSPITAL_COMMUNITY): Payer: Self-pay | Admitting: Cardiology

## 2022-07-02 ENCOUNTER — Ambulatory Visit
Admission: RE | Admit: 2022-07-02 | Discharge: 2022-07-02 | Disposition: A | Discharge status: Home / Self Care | Payer: Commercial Managed Care - PPO | Source: Ambulatory Visit | Attending: Urology | Admitting: Urology

## 2022-07-02 DIAGNOSIS — R972 Elevated prostate specific antigen [PSA]: Secondary | ICD-10-CM

## 2022-07-02 MED ORDER — GADOPICLENOL 0.5 MMOL/ML IV SOLN
8.0000 mL | Freq: Once | INTRAVENOUS | Status: AC | PRN
  Administered 2022-07-02: 8 mL via INTRAVENOUS

## 2022-07-10 ENCOUNTER — Telehealth: Payer: Self-pay | Admitting: Internal Medicine

## 2022-07-10 MED ORDER — FLECAINIDE ACETATE 100 MG PO TABS: 100.0000 mg | ORAL_TABLET | Freq: Two times a day (BID) | ORAL | 3 refills | Status: DC

## 2022-07-10 MED ORDER — DILTIAZEM HCL ER COATED BEADS 120 MG PO CP24: 120.0000 mg | ORAL_CAPSULE | Freq: Every day | ORAL | 3 refills | Status: DC

## 2022-07-10 NOTE — Telephone Encounter (Signed)
Phone message received from Dr. Caryl Comes requesting that prescriptions for: 1) Flecainide 100 mg BID & 2 Diltiazem 120 mg QD   Be sent to the patient's pharmacy- per Dr. Caryl Comes, he reviewed this by phone with the patient yesterday.   The patient is scheduled to follow up on 08/14/22 with Dr. Caryl Comes in the [place] office.  Will forward to Dr. Caryl Comes to insure no other follow up is needed prior to that time due to flecainide start.

## 2022-07-13 ENCOUNTER — Ambulatory Visit: Payer: Commercial Managed Care - PPO | Admitting: Internal Medicine

## 2022-07-14 NOTE — Telephone Encounter (Signed)
Spoke with pt and advised medication was sent to pharmacy as requested on 07/10/2022.  Pt verbalizes understanding and thanked Therapist, sports for the call.

## 2022-07-23 ENCOUNTER — Encounter: Payer: Self-pay | Admitting: Cardiovascular Disease

## 2022-07-23 ENCOUNTER — Ambulatory Visit: Payer: Commercial Managed Care - PPO | Attending: Cardiovascular Disease | Admitting: Cardiovascular Disease

## 2022-07-23 VITALS — BP 110/68 | HR 56 | Ht 73.0 in | Wt 192.8 lb

## 2022-07-23 DIAGNOSIS — I4819 Other persistent atrial fibrillation: Secondary | ICD-10-CM | POA: Diagnosis not present

## 2022-07-23 DIAGNOSIS — D6869 Other thrombophilia: Secondary | ICD-10-CM

## 2022-07-23 DIAGNOSIS — I484 Atypical atrial flutter: Secondary | ICD-10-CM

## 2022-07-23 NOTE — Progress Notes (Signed)
Electrophysiology Office Note:    Date:  07/23/2022   ID:  Steven Gray, DOB [birth year], MRN [id number]  PCP:  Burnard Bunting, MD   [place] Providers Cardiologist:  None     Referring MD: Burnard Bunting, MD   History of Present Illness:    Steven Gray is a 72 y.o. male with a hx listed below, significant for persistent AF, CHFrEF, OSA, referred for arrhythmia management.  He was diagnosed with AF in January, 2021 associated with CHF symptoms. 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 Cardioversion was deferred initially due to dense smoke in the LAA. He eventually was cardioverted in March. Imaging showed normalization of EF, but atria were severely enlarged.  He had an ablation by Dr. Rayann Heman in 05/14/20 but AF recurred and persisted.. Repeat ablation was performed on 12/03/20.  At that time, his pulmonary veins were largely silent.  There is only 1 small area of breakthrough.  Posterior wall ablation was performed.  He presented to AF clinic in February with recurrence of AF. He underwent DCCV on 06/12/2022.   He has remained in sinus rhythm since cardioversion.  He now has a prescription for flecainide but has not taken any of it.  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   Persistent atrial fibrillation ([place]) 06/03/2019   Right knee pain 12/07/2016   Sleep apnea    wears CPAP    Past Surgical History:  Procedure Laterality Date   ATRIAL FIBRILLATION ABLATION N/A 05/14/2020   Procedure: ATRIAL FIBRILLATION ABLATION;  Surgeon: Thompson Grayer, MD;  Location: [place] [facility];  Service:  Cardiovascular;  Laterality: N/A;   ATRIAL FIBRILLATION ABLATION N/A 12/03/2020   Procedure: ATRIAL FIBRILLATION ABLATION;  Surgeon: Thompson Grayer, MD;  Location: [place] [facility];  Service: Cardiovascular;  Laterality: N/A;   CARDIOVERSION N/A 07/04/2019   Procedure: CARDIOVERSION;  Surgeon: Sueanne Margarita, MD;  Location: [place];  Service: Cardiovascular;  Laterality: N/A;   CARDIOVERSION N/A 11/30/2019   Procedure: CARDIOVERSION;  Surgeon: Lelon Perla, MD;  Location: [facility];  Service: Cardiovascular;  Laterality: N/A;   CARDIOVERSION N/A 04/02/2020   Procedure: CARDIOVERSION;  Surgeon: Skeet Latch, MD;  Location: [place];  Service: Cardiovascular;  Laterality: N/A;   CARDIOVERSION N/A 10/02/2020   Procedure: CARDIOVERSION;  Surgeon: Minna Merritts, MD;  Location: [place] [facility];  Service: Cardiovascular;  Laterality: N/A;   CARDIOVERSION N/A 11/06/2020   Procedure: CARDIOVERSION;  Surgeon: Donato Heinz, MD;  Location: [place];  Service: Cardiovascular;  Laterality: N/A;   CARDIOVERSION N/A 09/18/2021   Procedure: CARDIOVERSION;  Surgeon: Lelon Perla, MD;  Location: [facility] ENDOSCOPY;  Service: Cardiovascular;  Laterality: N/A;   CARDIOVERSION N/A 06/12/2022   Procedure: CARDIOVERSION;  Surgeon: Buford Dresser, MD;  Location: [place];  Service: Cardiovascular;  Laterality: N/A;   MENISCUS REPAIR     TEE WITHOUT CARDIOVERSION N/A 06/05/2019   Procedure: TRANSESOPHAGEAL ECHOCARDIOGRAM (TEE);  Surgeon: Josue Hector, MD;  Location: [facility] ENDOSCOPY;  Service: Cardiovascular;  Laterality: N/A;   TEE WITHOUT CARDIOVERSION N/A 07/04/2019   Procedure: TRANSESOPHAGEAL ECHOCARDIOGRAM (TEE);  Surgeon: Radford Pax,  Eber Hong, MD;  Location: [place];  Service: Cardiovascular;  Laterality: N/A;    Current Medications: No outpatient medications have been marked as taking for the 07/23/22 encounter (Appointment) with Dwight Burdo, Yetta Barre, MD.     Allergies:   Patient  has no known allergies.   Social and Family History: Reviewed in Epic  ROS:   Please see the history of present illness.    All other systems reviewed and are negative.  EKGs/Labs/Other Studies Reviewed Today:    Echocardiogram:    Monitors:    Advanced imaging:   EKG:  Last EKG results: today - sinus rhythm, mildly bradycardic   Recent Labs: 10/02/2021: Magnesium 2.3 06/09/2022: BUN 23; Creatinine, Ser 1.30; Hemoglobin 15.7; Platelets 163; Potassium 4.4; Sodium 142     Physical Exam:    VS:  There were no vitals taken for this visit.    Wt Readings from Last 3 Encounters:  06/12/22 192 lb 9.6 oz (87.4 kg)  06/09/22 192 lb 9.6 oz (87.4 kg)  09/18/21 187 lb (84.8 kg)     GEN: Well nourished, well developed in no acute distress CARDIAC: RRR, no murmurs, rubs, gallops RESPIRATORY:  Normal work of breathing MUSCULOSKELETAL: no edema    ASSESSMENT & PLAN:    Atrial fibrillation -- persistent - S/p ablation by Dr. Rayann Heman x2 --veins are very likely blocked.  A posterior wall ablation was performed during the second procedure.  There are not likely a lot of targets remaining for transvenous ablation. - I think convergent is probably the best option if invasive procedure pursued at this point -He has been maintaining sinus rhythm after cardioversion -Now utilizing a pill in the pocket approach with flecainide.  Atypical atrial flutter - often a difficult target for ablation  Secondary hypercoagulable state due to AF - Chads2-Vasc is 2 - continue eliquis        Medication Adjustments/Labs and Tests Ordered: Current medicines are reviewed at length with the patient today.  Concerns regarding medicines are outlined above.  No orders of the defined types were placed in this encounter.  No orders of the defined types were placed in this encounter.    Signed, Melida Quitter, MD  07/23/2022 7:55 AM    [place]

## 2022-07-23 NOTE — Patient Instructions (Signed)
Medication Instructions:  Your physician recommends that you continue on your current medications as directed. Please refer to the Current Medication list given to you today.  *If you need a refill on your cardiac medications before your next appointment, please call your pharmacy*  Lab Work: None ordered  Testing/Procedures: None ordered  Follow-Up: At [facility], you and your health needs are our priority.  As part of our continuing mission to provide you with exceptional heart care, we have created designated Provider Care Teams.  These Care Teams include your primary Cardiologist (physician) and Advanced Practice Providers (APPs -  Physician Assistants and Nurse Practitioners) who all work together to provide you with the care you need, when you need it.  Your next appointment:   08/14/2022 at 08:00AM  The format for your next appointment:   In Person  Provider:   Virl Axe, MD

## 2022-07-27 ENCOUNTER — Telehealth (HOSPITAL_COMMUNITY): Payer: Self-pay | Admitting: *Deleted

## 2022-07-27 DIAGNOSIS — I4819 Other persistent atrial fibrillation: Secondary | ICD-10-CM

## 2022-07-27 NOTE — Telephone Encounter (Signed)
Patient called in stating over weekend he went back into Afib. Hrs 60-110. Pt does not take flecainide/diltiazem on a daily basis per instruction only taking as needed. Pt did take a dose of flecainide yesterday he was in NSR when he got up this morning but is now back out of rhythm HR 110. Instructed pt to take a dose of diltiazem and flecainide now. Pt is interested in consult with [facility] regarding convergent procedure now will make the referral per dr Myles Gip last note. Pt will call back if Af continues despite addition of flecainide/diltiazem today. Confirmed no missed doses of Eliquis.

## 2022-07-28 ENCOUNTER — Other Ambulatory Visit: Payer: Self-pay | Admitting: Internal Medicine

## 2022-07-31 ENCOUNTER — Encounter (HOSPITAL_COMMUNITY): Payer: Self-pay

## 2022-08-02 NOTE — Progress Notes (Deleted)
[name]       [place] [address]             [phone]                    Steven Gray Medical Record [id number] Date of Birth: April 11, 1951  Referring: Burnard Bunting, MD Primary Care: Burnard Bunting, MD Primary Cardiologist: None  Chief Complaint:   No chief complaint on file.   History of Present Illness:    Steven Gray 72 y.o. male who presents for evaluation of hybrid surgical ablation to atrial fibrillation.   He had palpitations back in 2014 and saw Burt Knack MD. Later ER visit in 2021 to [place] with AF.   Has had 2 ablations ([place], 2020, 2022) and *** cardioversions.    Per cardiology h and p " Steven Gray is a 72 y.o. male with a history of HLD, OSA, persistent atrial fibrillation, systolic CHF, CAD who presents for follow up in the [facility].  The patient was initially diagnosed with atrial fibrillation 06/03/19 after presenting to the ER with a 43-month onset of worsening dyspnea, orthopnea and PND.  Patient was previously seen by Dr. Burt Knack in 2014 for palpitation.  ETT at the time was low risk.  Heart monitor showed sinus rhythm with PAC and PVC, otherwise no significant arrhythmia. On arrival to the ED at [facility] on 06/03/2019, he was noted to be in new atrial fibrillation with RVR with heart rate of 110-120s. Echo showed reduced EF 30-35% with a severely dilated LA. Patient was started on Eliquis for a CHADS2VASC score of 2. Plan was for patient to undergo TEE/DCCV but TEE showed dense smoke and LA thrombus and so DCCV was not attempted. He was discharged on Toprol and digoxin for rate control. Patient underwent TEE guided DCCV on 07/04/19 which was successful. Repeat echo and cardiac MRI showed normalized EF with severe biatrial enlargement. Patient had done well until the week of 11/20/19 when his smart watch again showed afib. He reports good rate control and has been  asymptomatic. Patient is s/p DCCV on 11/30/19. Patient is s/p afib ablation with Dr Rayann Heman on 05/14/20 but unfortunately had recurrent persistent afib. He had a DCCV on 11/06/20 and underwent repeat ablation on 12/03/20.   On follow up today, patient reports that he has been persistently in afib for the past two weeks. He does note that his afib typically starts while eating. There were no other specific triggers that he could identify. No bleeding issues on anticoagulation.    Today, he denies symptoms of palpitations, chest pain, orthopnea, PND, lower extremity edema, dizziness, presyncope, syncope, bleeding, or neurologic sequela. The patient is tolerating medications without difficulties and is otherwise without complaint today.   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   Persistent atrial fibrillation ([place]) 06/03/2019   Right knee pain 12/07/2016   Sleep apnea    wears CPAP    Past Surgical History:  Procedure Laterality Date   ATRIAL FIBRILLATION ABLATION N/A 05/14/2020   Procedure: ATRIAL FIBRILLATION  ABLATION;  Surgeon: Thompson Grayer, MD;  Location: [place] [facility];  Service: Cardiovascular;  Laterality: N/A;   ATRIAL FIBRILLATION ABLATION N/A 12/03/2020   Procedure: ATRIAL FIBRILLATION ABLATION;  Surgeon: Thompson Grayer, MD;  Location: [place] [facility];  Service: Cardiovascular;  Laterality: N/A;   CARDIOVERSION N/A 07/04/2019   Procedure: CARDIOVERSION;  Surgeon: Sueanne Margarita, MD;  Location: [place];  Service: Cardiovascular;  Laterality: N/A;   CARDIOVERSION N/A 11/30/2019   Procedure: CARDIOVERSION;  Surgeon: Lelon Perla, MD;  Location: [facility] ENDOSCOPY;   Service: Cardiovascular;  Laterality: N/A;   CARDIOVERSION N/A 04/02/2020   Procedure: CARDIOVERSION;  Surgeon: Skeet Latch, MD;  Location: [place];  Service: Cardiovascular;  Laterality: N/A;   CARDIOVERSION N/A 10/02/2020   Procedure: CARDIOVERSION;  Surgeon: Minna Merritts, MD;  Location: [place] [facility];  Service: Cardiovascular;  Laterality: N/A;   CARDIOVERSION N/A 11/06/2020   Procedure: CARDIOVERSION;  Surgeon: Donato Heinz, MD;  Location: [place];  Service: Cardiovascular;  Laterality: N/A;   CARDIOVERSION N/A 09/18/2021   Procedure: CARDIOVERSION;  Surgeon: Lelon Perla, MD;  Location: [facility] ENDOSCOPY;  Service: Cardiovascular;  Laterality: N/A;   CARDIOVERSION N/A 06/12/2022   Procedure: CARDIOVERSION;  Surgeon: Buford Dresser, MD;  Location: [place];  Service: Cardiovascular;  Laterality: N/A;   MENISCUS REPAIR     TEE WITHOUT CARDIOVERSION N/A 06/05/2019   Procedure: TRANSESOPHAGEAL ECHOCARDIOGRAM (TEE);  Surgeon: Josue Hector, MD;  Location: [facility] ENDOSCOPY;  Service: Cardiovascular;  Laterality: N/A;   TEE WITHOUT CARDIOVERSION N/A 07/04/2019   Procedure: TRANSESOPHAGEAL ECHOCARDIOGRAM (TEE);  Surgeon: Sueanne Margarita, MD;  Location: [facility] ENDOSCOPY;  Service: Cardiovascular;  Laterality: N/A;    Family History  Problem Relation Age of Onset   Polycystic kidney disease Mother    Vascular Disease Father        vascular dementia   CAD Maternal Grandfather    Atrial fibrillation Neg Hx      Social History   Tobacco Use  Smoking Status Never  Smokeless Tobacco Never  Tobacco Comments   Never smoke 06/09/22    Social History   Substance and Sexual Activity  Alcohol Use Yes   Comment: he reports a couple glasses of wine     No Known Allergies  Current Outpatient Medications  Medication Sig Dispense Refill   apixaban (ELIQUIS) 5 MG TABS tablet TAKE 1 TABLET(5 MG) BY MOUTH TWICE DAILY 180 tablet 1   atorvastatin (LIPITOR) 40 MG tablet  TAKE 1 TABLET(40 MG) BY MOUTH DAILY AT 6 PM 30 tablet 0   diltiazem (CARDIZEM CD) 120 MG 24 hr capsule Take 1 capsule (120 mg total) by mouth daily. 30 capsule 3   flecainide (TAMBOCOR) 100 MG tablet Take 1 tablet (100 mg total) by mouth 2 (two) times daily. (Patient not taking: Reported on 07/23/2022) 60 tablet 3   losartan (COZAAR) 25 MG tablet TAKE 1 TABLET(25 MG) BY MOUTH DAILY 60 tablet 0   MAGNESIUM PO Take 1 tablet by mouth daily.     metoprolol succinate (TOPROL XL) 25 MG 24 hr tablet Taking only as needed for A-fib episodes     ORAL ELECTROLYTES PO Take 1 Scoop by mouth daily. Electrolyte multivitamin powder     zolpidem (AMBIEN) 10 MG tablet Take 2.5-5 mg by mouth at bedtime as needed for sleep.     No current facility-administered medications for this visit.    ROS 14 point ROS reviewed and negative except as per HPI   PHYSICAL EXAMINATION:  There were no vitals taken for this visit.  Gen: NAD Neuro: Alert and oriented Resp: Nonlaboured Abd: Soft, ntnd Extr: WWP  Diagnostic Studies & Laboratory data:     Recent Radiology Findings:   No results found.     I have independently reviewed the above radiology studies  and reviewed the findings with the patient.   Recent Lab Findings: Lab Results  Component Value Date   WBC 10.0 06/09/2022   HGB 15.7 06/09/2022   HCT 46.4 06/09/2022   PLT 163 06/09/2022   GLUCOSE 69 (L) 06/09/2022   CHOL 164 06/04/2019   TRIG 94 06/04/2019   HDL 33 (L) 06/04/2019   LDLCALC 112 (H) 06/04/2019   NA 142 06/09/2022   K 4.4 06/09/2022   CL 110 06/09/2022   CREATININE 1.30 (H) 06/09/2022   BUN 23 06/09/2022   CO2 22 06/09/2022   TSH 0.737 06/03/2019   INR 1.2 06/04/2019   HGBA1C 5.8 (H) 06/04/2019     Assessment / Plan:   Steven Gray 72 y.o. male who presents for evaluation of hybrid surgical ablation to atrial fibrillation.     He appears to have clinically had AF for 10+ years. His left atrium is very large as well.    Back in Jan 2021 he had an EF down to 30% on several echos. Within 6 months by  MR it was back up to normal EF.   Comorbidities include sleep apnea.  I told him we are getting started with Convergent here and he would be on of the initial patients. I offered him procedure to be later if our experience here if that's what he prefers. It seems as of now he wants to get this done.     Typical workup includes: CTA coronary for patients > 69 yo. - He's already had a CT prior to ablation x 2 and though it was a % coronary calcium doesn't appear too severe and has no anginal symptoms.  Echo in the last year - we need this done.    We broadly discussed the rationale for the convergent approach.  Standard recovery is walking around day after procedure, discharge to home postop day 2.  I see convergent (epicardial ablation) patients postoperatively back in the clinic in 1 week and 1 month. Patients generally have a full recovery by 3 weeks.    Risks/benefits/alternatives discussed at length. All questions were asked an answered. Risks are <<1% mortality, 2% morbidity (bleeding, infection, damage to surrounding structures, myocardial infarction) and >97% standard recovery.   Typically I do these procedures on Mondays, last dose of Eliquis is Saturday morning. I restart AC the night of surgery. If they receive an LAA clip, most patients, but not all, come off their AC 2-6 months after surgery at the discretion of their referring cardiologist. Many ultimately have reductions in the degree of antiarrythmic medications as well. Expected outcome is significantly more freedom from Afib (measured by 90% reduction of time in AF).   I  spent 60 minutes with  the patient face to face in counseling and coordination of care.    Steven Gray 08/02/2022 5:18 PM

## 2022-08-06 ENCOUNTER — Encounter: Payer: Commercial Managed Care - PPO | Admitting: Cardiothoracic Surgery

## 2022-08-14 ENCOUNTER — Ambulatory Visit: Payer: Commercial Managed Care - PPO | Attending: Internal Medicine | Admitting: Internal Medicine

## 2022-08-14 ENCOUNTER — Encounter: Payer: Self-pay | Admitting: Internal Medicine

## 2022-08-14 VITALS — BP 108/66 | HR 66 | Ht 73.0 in | Wt 193.6 lb

## 2022-08-14 DIAGNOSIS — I484 Atypical atrial flutter: Secondary | ICD-10-CM | POA: Diagnosis not present

## 2022-08-14 DIAGNOSIS — I4819 Other persistent atrial fibrillation: Secondary | ICD-10-CM | POA: Diagnosis not present

## 2022-08-14 MED ORDER — FLECAINIDE ACETATE 150 MG PO TABS: ORAL_TABLET | ORAL | 0 refills | Status: DC

## 2022-08-14 NOTE — Progress Notes (Signed)
Patient Care Team: Geoffry Paradise, MD as PCP - General (Internal Medicine)   HPI  Steven Gray is a 72 y.o. male seen following presentation with congestive heart failure found to be in atrial fibrillation with a rapid rate and with a cardiomyopathy with depressed LV function.  Initial efforts at TEE cardioversion were complicated by the presence of left atrial clot.  He was also found to have severe left atrial enlargement.  4 weeks of anticoagulation resulted in resolution of the clot and he underwent cardioversion and feels much much better.    Taking beta-blockers and losartan.  Interval atrial fibrillation 7/21 and again 11/21   Underwent catheter ablation for atrial fibrillation 1/22 (JA) and repeat 8/22. Recurrent AF and DCCV 1/24  saw GM 3/24 and the thought was convergent ablation if repeat invasive solutions are sought.    Preprocedural CT scan demonstrated coronary calcification.  Myoview scanning was negative.  Started on statins  Anticoagulation w Apixoban  bleeding   The patient denies chest pain, shortness of breath, nocturnal dyspnea, orthopnea or peripheral edema.  There have been no lightheadedness or syncope.      DATE TEST EF   1/21 Echo  30-35 % LAE-severe (66ml/m2)  *3/21 TEE 30-35 %   4/21 Echo  60-65%   6/21 cMRI 62% LAE mod-RAE mild LGE neg  1/22 CTA   calcification noted in the LAD left main and a calcium score of 719  2/22 Myoview  No ischemia        Date Cr K Hgb  2/21 1.36 4.4 15.8  7/21  1.19 4.0 14.9  1/22 1.02 4.8 14.8  11/22 1.28 4.4 15.2  2/24 1.3 4.4 15.7   Thromboembolic risk factors ( age -67 , CHF-0.5) for a CHADSVASc Score of 1.5   Records and Results Reviewed   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   Hyperlipidemia    does not take medication for this, denies   Increased prostate specific antigen (PSA) velocity    Left  atrial enlargement    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   Persistent atrial fibrillation 06/03/2019   Right knee pain 12/07/2016   Sleep apnea    wears CPAP    Past Surgical History:  Procedure Laterality Date   ATRIAL FIBRILLATION ABLATION N/A 05/14/2020   Procedure: ATRIAL FIBRILLATION ABLATION;  Surgeon: Hillis Range, MD;  [facility];  Service: Cardiovascular;  Laterality: N/A;   ATRIAL FIBRILLATION ABLATION N/A 12/03/2020   Procedure: ATRIAL FIBRILLATION ABLATION;  Surgeon: Hillis Range, MD;  Location: [facility];  Service: Cardiovascular;  Laterality: N/A;   CARDIOVERSION N/A 07/04/2019   Procedure: CARDIOVERSION;  Surgeon: Quintella Reichert, MD;  Location: [facility] ENDOSCOPY;  Service: Cardiovascular;  Laterality: N/A;   CARDIOVERSION N/A 11/30/2019   Procedure: CARDIOVERSION;  Surgeon: Lewayne Bunting, MD;  Location: [facility] ENDOSCOPY;  Service: Cardiovascular;  Laterality: N/A;   CARDIOVERSION N/A 04/02/2020   Procedure: CARDIOVERSION;  Surgeon: Chilton Si, MD;  Location: [facility] ENDOSCOPY;  Service: Cardiovascular;  Laterality: N/A;   CARDIOVERSION N/A 10/02/2020   Procedure: CARDIOVERSION;  Surgeon: Antonieta Iba, MD;  Location: [facility] ORS;  Service: Cardiovascular;  Laterality: N/A;   CARDIOVERSION N/A 11/06/2020   Procedure: CARDIOVERSION;  Surgeon: [name], MD;  Location: [facility] ENDOSCOPY;  Service: Cardiovascular;  Laterality: N/A;  CARDIOVERSION N/A 09/18/2021   Procedure: CARDIOVERSION;  Surgeon: Lewayne Bunting, MD;  Location: [facility] ENDOSCOPY;  Service: Cardiovascular;  Laterality: N/A;   CARDIOVERSION N/A 06/12/2022   Procedure: CARDIOVERSION;  Surgeon: Jodelle Red, MD;  Location: [facility] ENDOSCOPY;  Service: Cardiovascular;  Laterality: N/A;   MENISCUS REPAIR     TEE WITHOUT CARDIOVERSION N/A 06/05/2019   Procedure: TRANSESOPHAGEAL ECHOCARDIOGRAM (TEE);  Surgeon: Wendall Stade, MD;  Location: [facility] ENDOSCOPY;   Service: Cardiovascular;  Laterality: N/A;   TEE WITHOUT CARDIOVERSION N/A 07/04/2019   Procedure: TRANSESOPHAGEAL ECHOCARDIOGRAM (TEE);  Surgeon: Quintella Reichert, MD;  Location: [facility] ENDOSCOPY;  Service: Cardiovascular;  Laterality: N/A;    Current Meds  Medication Sig   apixaban (ELIQUIS) 5 MG TABS tablet TAKE 1 TABLET(5 MG) BY MOUTH TWICE DAILY   atorvastatin (LIPITOR) 40 MG tablet TAKE 1 TABLET(40 MG) BY MOUTH DAILY AT 6 PM   diltiazem (CARDIZEM CD) 120 MG 24 hr capsule Take 1 capsule (120 mg total) by mouth daily.   flecainide (TAMBOCOR) 100 MG tablet Take 1 tablet (100 mg total) by mouth 2 (two) times daily.   losartan (COZAAR) 25 MG tablet TAKE 1 TABLET(25 MG) BY MOUTH DAILY   MAGNESIUM PO Take 1 tablet by mouth daily.   ORAL ELECTROLYTES PO Take 1 Scoop by mouth daily. Electrolyte multivitamin powder   zolpidem (AMBIEN) 10 MG tablet Take 2.5-5 mg by mouth at bedtime as needed for sleep.     No Known Allergies    Review of Systems negative except from HPI and PMH  Physical Exam BP 108/66   Pulse 66   Ht 6\' 1"  (1.854 m)   Wt 193 lb 9.6 oz (87.8 kg)   SpO2 96%   BMI 25.54 kg/m  Well developed and nourished in no acute distress HENT normal Neck supple with JVP-  flat  Clear Regular rate and rhythm, no murmurs or gallops Abd-soft with active BS No Clubbing cyanosis edema Skin-warm and dry A & Oriented  Grossly normal sensory and motor function  ECG sinus at 58  17/10/44   CrCl cannot be calculated (Patient's most recent lab result is older than the maximum 21 days allowed.).   Assessment and  Plan Atrial fibrillation-persistent  Cardiomyopathy-presumed nonischemic question rate related  Left atrial enlargement-moderate<<-severe  Coronary artery calcification-calcium score 719 with negative Myoview     Patient with persistent atrial fibrillation.  Discussed a variety of strategies including ongoing flecainide/do not versus pill in the pocket, we have opted to  do the latter.  He will take the diltiazem an hour before he would take his flecainide 300 mg.  He will take it in the morning if atrial fibrillation persisted from the prior day.  He has appointment to discuss convergent ablation.  His preference would be to have a repeat endovascular procedure and if it comes to that, we discussed potentially going to a place like [place] where there is a vast experience in repeat ablation.  The other thought that comes to mind now, did not mention it with the patient is a guy named Jacqlyn Larsen in [place] who has a great deal experience also with repeat ablation procedures.  No bleeding.  Continue anticoagulation

## 2022-08-14 NOTE — Patient Instructions (Addendum)
Medication Instructions:  Your physician has recommended you make the following change in your medication:   ** Stop daily Diltiazem 12omg  ** Stop Daily Flecainide 100mg   Start Diltiazem 120mg  - 1 hour prior to Flecainide 300mg   *If you need a refill on your cardiac medications before your next appointment, please call your pharmacy*   Lab Work: None ordered.  If you have labs (blood work) drawn today and your tests are completely normal, you will receive your results only by: MyChart Message (if you have MyChart) OR A paper copy in the mail If you have any lab test that is abnormal or we need to change your treatment, we will call you to review the results.   Testing/Procedures: None ordered.    Follow-Up: At [facility], you and your health needs are our priority.  As part of our continuing mission to provide you with exceptional heart care, we have created designated Provider Care Teams.  These Care Teams include your primary Cardiologist (physician) and Advanced Practice Providers (APPs -  Physician Assistants and Nurse Practitioners) who all work together to provide you with the care you need, when you need it.  We recommend signing up for the patient portal called "MyChart".  Sign up information is provided on this After Visit Summary.  MyChart is used to connect with patients for Virtual Visits (Telemedicine).  Patients are able to view lab/test results, encounter notes, upcoming appointments, etc.  Non-urgent messages can be sent to your provider as well.   To learn more about what you can do with MyChart, go to [email or web address].    Your next appointment:   6 months with Dr Graciela Husbands

## 2022-08-20 ENCOUNTER — Institutional Professional Consult (permissible substitution) (INDEPENDENT_AMBULATORY_CARE_PROVIDER_SITE_OTHER): Payer: Medicare Other | Admitting: Cardiothoracic Surgery

## 2022-08-20 ENCOUNTER — Encounter: Payer: Self-pay | Admitting: Cardiothoracic Surgery

## 2022-08-20 VITALS — BP 127/80 | HR 67 | Resp 20 | Ht 73.0 in | Wt 185.0 lb

## 2022-08-20 DIAGNOSIS — I4819 Other persistent atrial fibrillation: Secondary | ICD-10-CM

## 2022-08-20 NOTE — Progress Notes (Signed)
[address]       [place] [address]             [phone]                    Stepfan Brining [facility] Medical Record #[id number] Date of Birth: [birth year]  Referring: Mealor, Roberts Gaudy, MD Primary Care: Geoffry Paradise, MD Primary Cardiologist: None  Chief Complaint:    Chief Complaint  Patient presents with   Atrial Fibrillation    Surgical consult    History of Present Illness:    Steven Gray 72 y.o. male with persistent AF.   Says he can do everything he could when he was 72 yo.   Sometimes says he actually feels better when he's in AF.   Monitor 10 yr ago bc had palps after exercise, told benign  Has had 6-7 cardioversions  In 2021 had SOB in bed. Had EF 30-35% 3 months later had EF normal 65%.   January and August of 22 had ablations for AF.   AF clinic in Feb 2024 and had cardioversion. Back in AF 1 month later.   Never took a drug until age 37.   Per cardiology hpi Steven Gray is a 72 y.o. male with a hx listed below, significant for persistent AF, CHFrEF, OSA, referred for arrhythmia management.   He was diagnosed with AF in January, 2021 associated with CHF symptoms. 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 Cardioversion was deferred initially due to dense smoke in the LAA. He eventually was cardioverted in March. Imaging showed normalization of EF, but atria were severely enlarged.   He had an ablation by Dr. Johney Frame in 05/14/20 but AF recurred and persisted.. Repeat ablation was performed on 12/03/20.  At that time, his pulmonary veins were largely silent.  There is only 1 small area of breakthrough.  Posterior wall ablation was performed.   He presented to AF clinic in February with recurrence of AF. He underwent DCCV on 06/12/2022.    He has remained in sinus rhythm since cardioversion.  He now has a prescription for flecainide but has not taken any of it.      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   Persistent atrial fibrillation 06/03/2019   Right knee pain 12/07/2016   Sleep apnea    wears CPAP    Past Surgical History:  Procedure Laterality Date   ATRIAL FIBRILLATION ABLATION N/A 05/14/2020   Procedure: ATRIAL FIBRILLATION ABLATION;  Surgeon: Hillis Range, MD;  Location: [facility];  Service: Cardiovascular;  Laterality: N/A;   ATRIAL FIBRILLATION ABLATION N/A 12/03/2020   Procedure: ATRIAL FIBRILLATION ABLATION;  Surgeon: Hillis Range, MD;  Location: [facility];  Service: Cardiovascular;  Laterality: N/A;   CARDIOVERSION N/A 07/04/2019   Procedure: CARDIOVERSION;  Surgeon: Quintella Reichert, MD;  Location: [facility] ENDOSCOPY;  Service: Cardiovascular;  Laterality: N/A;   CARDIOVERSION N/A 11/30/2019   Procedure: CARDIOVERSION;  Surgeon: Lewayne Bunting, MD;  Location: [facility] ENDOSCOPY;  Service: Cardiovascular;  Laterality: N/A;   CARDIOVERSION N/A 04/02/2020  Procedure: CARDIOVERSION;  Surgeon: Chilton Si, MD;  Location: [facility] ENDOSCOPY;  Service: Cardiovascular;  Laterality: N/A;   CARDIOVERSION N/A 10/02/2020   Procedure: CARDIOVERSION;  Surgeon: Antonieta Iba, MD;  Location: [facility] ORS;  Service: Cardiovascular;  Laterality: N/A;   CARDIOVERSION N/A 11/06/2020   Procedure: CARDIOVERSION;  Surgeon: [name], MD;  Location: [facility] ENDOSCOPY;  Service: Cardiovascular;  Laterality: N/A;   CARDIOVERSION N/A 09/18/2021   Procedure: CARDIOVERSION;  Surgeon: Lewayne Bunting, MD;  Location: [facility] ENDOSCOPY;  Service: Cardiovascular;  Laterality: N/A;   CARDIOVERSION N/A 06/12/2022   Procedure: CARDIOVERSION;  Surgeon:  Jodelle Red, MD;  Location: [facility] ENDOSCOPY;  Service: Cardiovascular;  Laterality: N/A;   MENISCUS REPAIR     TEE WITHOUT CARDIOVERSION N/A 06/05/2019   Procedure: TRANSESOPHAGEAL ECHOCARDIOGRAM (TEE);  Surgeon: Wendall Stade, MD;  Location: [facility] ENDOSCOPY;  Service: Cardiovascular;  Laterality: N/A;   TEE WITHOUT CARDIOVERSION N/A 07/04/2019   Procedure: TRANSESOPHAGEAL ECHOCARDIOGRAM (TEE);  Surgeon: Quintella Reichert, MD;  Location: [facility] ENDOSCOPY;  Service: Cardiovascular;  Laterality: N/A;    Family History  Problem Relation Age of Onset   Polycystic kidney disease Mother    Vascular Disease Father        vascular dementia   CAD Maternal Grandfather    Atrial fibrillation Neg Hx      Social History   Tobacco Use  Smoking Status Never  Smokeless Tobacco Never  Tobacco Comments   Never smoke 06/09/22    Social History   Substance and Sexual Activity  Alcohol Use Yes   Comment: he reports a couple glasses of wine     No Known Allergies  Current Outpatient Medications  Medication Sig Dispense Refill   apixaban (ELIQUIS) 5 MG TABS tablet TAKE 1 TABLET(5 MG) BY MOUTH TWICE DAILY 180 tablet 1   atorvastatin (LIPITOR) 40 MG tablet TAKE 1 TABLET(40 MG) BY MOUTH DAILY AT 6 PM 30 tablet 0   diltiazem (CARDIZEM CD) 120 MG 24 hr capsule Take 1 capsule (120 mg total) by mouth daily. 30 capsule 3   flecainide (TAMBOCOR) 150 MG tablet Take 2 tablets ( ) for Afib episode. 2 tablet 0   losartan (COZAAR) 25 MG tablet TAKE 1 TABLET(25 MG) BY MOUTH DAILY 60 tablet 0   MAGNESIUM PO Take 1 tablet by mouth daily.     ORAL ELECTROLYTES PO Take 1 Scoop by mouth daily. Electrolyte multivitamin powder     zolpidem (AMBIEN) 10 MG tablet Take 2.5-5 mg by mouth at bedtime as needed for sleep.     No current facility-administered medications for this visit.    ROS 14 point ROS reviewed and negative except as per HPI   PHYSICAL EXAMINATION: BP 127/80   Pulse 67   Resp 20   Ht   (1.854 m)   Wt 185 lb (83.9 kg)   SpO2 96% Comment: RA  BMI 24.41 kg/m   Gen: NAD Neuro: Alert and oriented Resp: Nonlaboured Abd: Soft, ntnd Extr: WWP  Diagnostic Studies & Laboratory data:     Recent Radiology Findings:   No results found.     I have independently reviewed the above radiology studies  and reviewed the findings with the patient.   Recent Lab Findings: Lab Results  Component Value Date   WBC 10.0 06/09/2022   HGB 15.7 06/09/2022   HCT 46.4 06/09/2022   PLT 163 06/09/2022   GLUCOSE 69 (L) 06/09/2022   CHOL 164 06/04/2019   TRIG 94 06/04/2019  HDL 33 (L) 06/04/2019   LDLCALC 112 (H) 06/04/2019   NA 142 06/09/2022   K 4.4 06/09/2022   CL 110 06/09/2022   CREATININE 1.30 (H) 06/09/2022   BUN 23 06/09/2022   CO2 22 06/09/2022   TSH 0.737 06/03/2019   INR 1.2 06/04/2019   HGBA1C 5.8 (H) 06/04/2019     Assessment / Plan:   43M with AF and 2 prior ablations.    Knows he's in AF or not based on watch, not based on how he feels.   Says at this point doesn't feel worse in AF.   We could go either way with him. Back in 2021 he had EF 35% and essentially heart failure and this is now resolved. He is in great shape.  If he feels worse in AF or he wants/needs off blood thinners than would strongly consider hybrid ablation technique.    I told him we are getting started with Convergent here and he would be on of the initial patients. I offered him procedure to be later if our experience here if that's what he prefers. It seems as of now he wants to get this done.     Typical workup includes: CTA coronary for patients > 78 yo. - had a CT in 2022 Echo in the last year - has not had.   We broadly discussed the rationale for the convergent approach.  Standard recovery is walking around day after procedure, discharge to home postop day 2.  I see convergent (epicardial ablation) patients postoperatively back in the clinic in 1 week and 1 month. Patients  generally have a full recovery by 3 weeks.    Risks/benefits/alternatives discussed at length. All questions were asked an answered. Risks are <<1% mortality, 2% morbidity (bleeding, infection, damage to surrounding structures, myocardial infarction given prior stents) and >97% standard recovery.   Typically I do these procedures on Mondays, last dose of Eliquis is Saturday morning. I restart AC the night of surgery. If they receive an LAA clip, most patients, but not all, come off their AC 2-6 months after surgery at the discretion of their referring cardiologist. Many ultimately have reductions in the degree of antiarrythmic medications as well. Expected outcome is significantly more freedom from Afib (measured by 90% reduction of time in AF).   Will see him back after he has time to think about it. Probably hold off for a while. Dr. Graciela Husbands is his primary cardiologist.    I  spent 60 minutes with  the patient face to face in counseling and coordination of care.    Waverly Ferrari Baneen Wieseler 08/20/2022 3:13 PM

## 2022-09-04 ENCOUNTER — Encounter (HOSPITAL_COMMUNITY): Payer: Self-pay

## 2022-09-08 ENCOUNTER — Other Ambulatory Visit: Payer: Self-pay

## 2022-09-08 MED ORDER — ATORVASTATIN CALCIUM 40 MG PO TABS: ORAL_TABLET | ORAL | 3 refills | Status: DC

## 2022-11-26 ENCOUNTER — Other Ambulatory Visit: Payer: Self-pay

## 2022-11-26 DIAGNOSIS — I4819 Other persistent atrial fibrillation: Secondary | ICD-10-CM

## 2022-11-26 MED ORDER — APIXABAN 5 MG PO TABS: ORAL_TABLET | ORAL | 1 refills | Status: DC

## 2022-11-26 NOTE — Telephone Encounter (Signed)
Prescription refill request for Eliquis received. Indication: Afib  Last office visit: 08/14/22 Steven Gray)  Scr: 1.30 (06/09/22)  Age: 72 Weight: 83.9kg  Appropriate dose. Refill sent.

## 2023-01-06 ENCOUNTER — Other Ambulatory Visit: Payer: Self-pay | Admitting: Internal Medicine

## 2023-03-31 ENCOUNTER — Ambulatory Visit: Payer: Medicare Other | Attending: Internal Medicine | Admitting: Internal Medicine

## 2023-03-31 ENCOUNTER — Encounter: Payer: Self-pay | Admitting: Internal Medicine

## 2023-03-31 VITALS — BP 100/68 | HR 54 | Ht 73.0 in | Wt 189.0 lb

## 2023-03-31 DIAGNOSIS — Z79899 Other long term (current) drug therapy: Secondary | ICD-10-CM | POA: Diagnosis present

## 2023-03-31 DIAGNOSIS — I428 Other cardiomyopathies: Secondary | ICD-10-CM | POA: Insufficient documentation

## 2023-03-31 DIAGNOSIS — I4819 Other persistent atrial fibrillation: Secondary | ICD-10-CM | POA: Diagnosis not present

## 2023-03-31 LAB — CBC
Hematocrit: 43.2 % (ref 37.5–51.0)
Hemoglobin: 14.7 g/dL (ref 13.0–17.7)
MCH: 33.4 pg — ABNORMAL HIGH (ref 26.6–33.0)
MCHC: 34 g/dL (ref 31.5–35.7)
MCV: 98 fL — ABNORMAL HIGH (ref 79–97)
Platelets: 180 10*3/uL (ref 150–450)
RBC: 4.4 x10E6/uL (ref 4.14–5.80)
RDW: 12.9 % (ref 11.6–15.4)
WBC: 7.8 10*3/uL (ref 3.4–10.8)

## 2023-03-31 NOTE — Patient Instructions (Signed)
Medication Instructions:  Your physician recommends that you continue on your current medications as directed. Please refer to the Current Medication list given to you today.  *If you need a refill on your cardiac medications before your next appointment, please call your pharmacy*   Lab Work: CBC today  If you have labs (blood work) drawn today and your tests are completely normal, you will receive your results only by: MyChart Message (if you have MyChart) OR A paper copy in the mail If you have any lab test that is abnormal or we need to change your treatment, we will call you to review the results.   Testing/Procedures: None ordered.    Follow-Up: At [facility], you and your health needs are our priority.  As part of our continuing mission to provide you with exceptional heart care, we have created designated Provider Care Teams.  These Care Teams include your primary Cardiologist (physician) and Advanced Practice Providers (APPs -  Physician Assistants and Nurse Practitioners) who all work together to provide you with the care you need, when you need it.  We recommend signing up for the patient portal called "MyChart".  Sign up information is provided on this After Visit Summary.  MyChart is used to connect with patients for Virtual Visits (Telemedicine).  Patients are able to view lab/test results, encounter notes, upcoming appointments, etc.  Non-urgent messages can be sent to your provider as well.   To learn more about what you can do with MyChart, go to [email or web address].    Your next appointment:   12 months with Dr Graciela Husbands

## 2023-03-31 NOTE — Progress Notes (Signed)
Patient Care Team: Geoffry Paradise, MD as PCP - General (Internal Medicine)   HPI  Steven Gray is a 72 y.o. male seen following presentation with congestive heart failure found to be in atrial fibrillation with a rapid rate w secondary cardiomyopathy w interval recovery of LV function  Initial efforts at TEE cardioversion were complicated by the presence of left atrial clot.  He was also found to have severe left atrial enlargement.  4 weeks of anticoagulation resulted in resolution of the clot and he underwent cardioversion    Taking beta-blockers and losartan.  Interval atrial fibrillation 7/21 and again 11/21   Underwent catheter ablation for atrial fibrillation 1/22 (JA) and repeat 8/22. Recurrent AF and DCCV 1/24  saw GM 3/24 and the thought was convergent ablation if repeat invasive solutions are sought.  Referred to Dr Delia Chimes to discuss convergent ablation.    Preprocedural CT scan demonstrated coronary calcification.  Myoview scanning was negative.  Started on statins  The patient denies chest pain, shortness of breath, nocturnal dyspnea, orthopnea or peripheral edema.  There have been no palpitations, lightheadedness or syncope.   Has had a few episodes of atrial fibrillation over the last year, responding to flecainide take it as a cocktail.  DATE TEST EF   1/21 Echo  30-35 % LAE-severe (20ml/m2)  *3/21 TEE 30-35 %   4/21 Echo  60-65%   6/21 cMRI 62% LAE mod-RAE mild LGE neg  1/22 CTA   calcification noted in the LAD left main and a calcium score of 719  2/22 Myoview  No ischemia        Date Cr K Hgb  2/21 1.36 4.4 15.8  7/21  1.19 4.0 14.9  1/22 1.02 4.8 14.8  11/22 1.28 4.4 15.2  2/24 1.3 4.4 15.7   Thromboembolic risk factors ( age -50 , CHF-0.5) for a CHADSVASc Score of 1.5   Records and Results Reviewed   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    Hyperlipidemia    does not take medication for this, denies   Increased prostate specific antigen (PSA) velocity    Left atrial enlargement    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   Persistent atrial fibrillation (HCC) 06/03/2019   Right knee pain 12/07/2016   Sleep apnea    wears CPAP    Past Surgical History:  Procedure Laterality Date   ATRIAL FIBRILLATION ABLATION N/A 05/14/2020   Procedure: ATRIAL FIBRILLATION ABLATION;  Surgeon: Hillis Range, MD;  [facility];  Service: Cardiovascular;  Laterality: N/A;   ATRIAL FIBRILLATION ABLATION N/A 12/03/2020   Procedure: ATRIAL FIBRILLATION ABLATION;  Surgeon: Hillis Range, MD;  [facility]: [facility];  Service: Cardiovascular;  Laterality: N/A;   CARDIOVERSION N/A 07/04/2019   Procedure: CARDIOVERSION;  Surgeon: Quintella Reichert, MD;  Location: [facility] ENDOSCOPY;  Service: Cardiovascular;  Laterality: N/A;   CARDIOVERSION N/A 11/30/2019   Procedure: CARDIOVERSION;  Surgeon: Lewayne Bunting, MD;  Location: [facility] ENDOSCOPY;  Service: Cardiovascular;  Laterality: N/A;   CARDIOVERSION N/A 04/02/2020   Procedure: CARDIOVERSION;  Surgeon: Chilton Si, MD;  Location: [facility] ENDOSCOPY;  Service: Cardiovascular;  Laterality: N/A;   CARDIOVERSION N/A 10/02/2020   Procedure: CARDIOVERSION;  Surgeon: Antonieta Iba, MD;  Location: [facility] ORS;  Service: Cardiovascular;  Laterality: N/A;   CARDIOVERSION N/A 11/06/2020   Procedure:  CARDIOVERSION;  Surgeon: [name], MD;  Location: [facility] ENDOSCOPY;  Service: Cardiovascular;  Laterality: N/A;   CARDIOVERSION N/A 09/18/2021   Procedure: CARDIOVERSION;  Surgeon: Lewayne Bunting, MD;  Location: [facility] ENDOSCOPY;  Service: Cardiovascular;  Laterality: N/A;   CARDIOVERSION N/A 06/12/2022   Procedure: CARDIOVERSION;  Surgeon: Jodelle Red, MD;  Location: [facility] ENDOSCOPY;  Service: Cardiovascular;  Laterality: N/A;   MENISCUS REPAIR     TEE WITHOUT  CARDIOVERSION N/A 06/05/2019   Procedure: TRANSESOPHAGEAL ECHOCARDIOGRAM (TEE);  Surgeon: Wendall Stade, MD;  Location: [facility] ENDOSCOPY;  Service: Cardiovascular;  Laterality: N/A;   TEE WITHOUT CARDIOVERSION N/A 07/04/2019   Procedure: TRANSESOPHAGEAL ECHOCARDIOGRAM (TEE);  Surgeon: Quintella Reichert, MD;  Location: [facility] ENDOSCOPY;  Service: Cardiovascular;  Laterality: N/A;    Current Meds  Medication Sig   apixaban (ELIQUIS) 5 MG TABS tablet TAKE 1 TABLET(5 MG) BY MOUTH TWICE DAILY   atorvastatin (LIPITOR) 40 MG tablet TAKE 1 TABLET(40 MG) BY MOUTH DAILY AT 6 PM   diltiazem (CARDIZEM CD) 120 MG 24 hr capsule Take 1 capsule (120 mg total) by mouth daily. (Patient taking differently: Take 120 mg by mouth as needed.)   flecainide (TAMBOCOR) 150 MG tablet Take 2 tablets (300mg ) for Afib episode.   MAGNESIUM PO Take 1 tablet by mouth daily.   ORAL ELECTROLYTES PO Take 1 Scoop by mouth daily. Electrolyte multivitamin powder   zolpidem (AMBIEN) 10 MG tablet Take 2.5-5 mg by mouth at bedtime as needed for sleep.     No Known Allergies    Review of Systems negative except from HPI and PMH  Physical Exam BP 100/68   Pulse (!) 54   Ht 6\' 1"  (1.854 m)   Wt 189 lb (85.7 kg)   SpO2 97%   BMI 24.94 kg/m  Well developed and nourished in no acute distress HENT normal Neck supple with JVP-  flat   Clear Regular rate and rhythm, no murmurs or gallops Abd-soft with active BS No Clubbing cyanosis edema Skin-warm and dry A & Oriented  Grossly normal sensory and motor function  ECG sinus @  t54 17/09/47    CrCl cannot be calculated (Patient's most recent lab result is older than the maximum 21 days allowed.).   Assessment and  Plan Atrial fibrillation-persistent  Cardiomyopathy-presumed nonischemic question rate related  Left atrial enlargement-moderate<<-severe  Coronary artery calcification-calcium score 719 with negative Myoview   W persistent Afib, using flec prn No bleeding  anticoagulation will continue with Eliquis  With his recovered cardiomyopathy, we will have him resume his losartan 25 mg at bedtime  Broached the topic particular in light of the recent trial of watchman versus NOAC about pursuing Watchman.  Right now he is content to remain on Eliquis.

## 2023-05-06 ENCOUNTER — Telehealth: Payer: Self-pay | Admitting: *Deleted

## 2023-05-06 NOTE — Telephone Encounter (Signed)
   Pre-operative Risk Assessment    Patient Name: Steven Gray  DOB: 05-16-1950 MRN: [id number]   Date of last office visit: 03/31/23 DR. KLEIN Date of next office visit: NONE   Request for Surgical Clearance    Procedure:  SCREENING COLONOSCOPY  Date of Surgery:  Clearance TBD                                Surgeon:  DR. BURNETTE Surgeon's Group or Practice Name:  [facility] Phone number:  [phone] Fax number:  [phone]   Type of Clearance Requested:   - Medical  - Pharmacy:  Hold Apixaban  (Eliquis )     Type of Anesthesia:   PROPOFOL    Additional requests/questions:    Steven Gray   05/06/2023, 1:27 PM

## 2023-05-06 NOTE — Telephone Encounter (Signed)
   Primary Cardiologist: None  Chart reviewed as part of pre-operative protocol coverage. Given past medical history and time since last visit, based on ACC/AHA guidelines, NAWAF STRANGE would be at acceptable risk for the planned procedure without further cardiovascular testing.   Patient was advised that if he develops new symptoms prior to surgery to contact our office to arrange a follow-up appointment.  He verbalized understanding.  Per office protocol, patient can hold Xarelto for 1 days prior to procedure.    I will route this recommendation to the requesting party via Epic fax function and remove from pre-op pool.  Please call with questions.  Rosaline EMERSON Bane, NP-C  05/06/2023, 4:00 PM [address] Office [phone] Fax [phone]

## 2023-05-06 NOTE — Telephone Encounter (Signed)
 Patient with diagnosis of afib on Eliquis  for anticoagulation.    Procedure: SCREENING COLONOSCOPY   Date of procedure: TBD   CHA2DS2-VASc Score =     This indicates a  % annual risk of stroke. The patient's score is based upon: CHF History: 1 HTN History: 0 Vascular Disease History: 0 Age Score: 1 Gender Score: 0     CrCl 63 mL/min Platelet count 180 K    Per office protocol, patient can hold Xarelto for 1 days prior to procedure.    **This guidance is not considered finalized until pre-operative APP has relayed final recommendations.**

## 2023-05-19 ENCOUNTER — Telehealth: Payer: Self-pay | Admitting: Internal Medicine

## 2023-05-19 NOTE — Telephone Encounter (Signed)
   Pre-operative Risk Assessment    Patient Name: Steven Gray [facility]  DOB: 1950/05/25 MRN: [id number]   Date of last office visit: 03/31/2023  Date of next office visit: NONE   Request for Surgical Clearance    Procedure:   colonoscopy  Date of Surgery:  Clearance 06/15/23                                Surgeon:  Dr. Ozell Blunt Surgeon's Group or Practice Name:  [facility] Phone number:  [phone] Fax number:  [phone]   Type of Clearance Requested:   - Pharmacy:  Hold Apixaban  (Eliquis ) okay to  hold 1-2 days prior?   Type of Anesthesia:   Propofol    Additional requests/questions:    Signed, Lacey Pian   05/19/2023, 4:47 PM

## 2023-05-21 NOTE — Telephone Encounter (Signed)
 Pharmacy please advise on holding Eliquis prior to colonoscopy scheduled for 06/15/2023. Thank you.

## 2023-05-24 NOTE — Telephone Encounter (Signed)
This is a duplication of clearance request on 03/05/24.  Pharmacy response is in that encounter

## 2023-05-24 NOTE — Telephone Encounter (Signed)
   Patient Name: Steven Gray [facility]  DOB: 07-12-1950 MRN: [id number]  Primary Cardiologist: None  Clinical pharmacists have reviewed the patient's past medical history, labs, and current medications as part of preoperative protocol coverage. The following recommendations have been made:  Per office protocol, patient can hold Xarelto for 1 days prior to procedure.    I will route this recommendation to the requesting party via Epic fax function and remove from pre-op pool.  Please call with questions.  Denyce Robert, NP 05/24/2023, 2:30 PM

## 2023-05-28 ENCOUNTER — Telehealth: Payer: Self-pay

## 2023-05-28 NOTE — Telephone Encounter (Signed)
   Pre-operative Risk Assessment    Patient Name: Cecilia Nishikawa [facility]  DOB: 1951-02-02 MRN: [id number]   Date of last office visit: 03/31/23 Date of next office visit: n/a   Request for Surgical Clearance    Procedure:   prostate biopsy   Date of Surgery:  Clearance TBD                                 Surgeon:   Surgeon's Group or Practice Name:  [facility]  Phone number:  [phone] Fax number:  [phone]   Type of Clearance Requested:   - Medical  - Pharmacy:  Hold Apixaban (Eliquis) stop for 3 days    Type of Anesthesia:  Not Indicated   Additional requests/questions:    Vance Peper   05/28/2023, 3:04 PM

## 2023-05-30 NOTE — Telephone Encounter (Signed)
Patient with diagnosis of PAF on Eliquis for anticoagulation.    Procedure: Prostate Biopsy Date of procedure: TBD   CHA2DS2-VASc Score = 2   This indicates a 2.2% annual risk of stroke. The patient's score is based upon: CHF History: 1 HTN History: 0 Diabetes History: 0 Stroke History: 0 Vascular Disease History: 0 Age Score: 1 Gender Score: 0     CrCl 63 mL/min Platelet count 180 K   Per office protocol, patient can hold Eliquis for 3 days prior to procedure.     **This guidance is not considered finalized until pre-operative APP has relayed final recommendations.**

## 2023-06-01 ENCOUNTER — Telehealth: Payer: Self-pay

## 2023-06-01 NOTE — Telephone Encounter (Signed)
Patient informed that his procedure is scheduled for 3/13.

## 2023-06-01 NOTE — Telephone Encounter (Signed)
   Name: Steven Gray [facility]  DOB: 10/21/1950  MRN: [id number]  Primary Cardiologist: None   Preoperative team, please contact this patient and set up a phone call appointment for further preoperative risk assessment. Please obtain consent and complete medication review. Thank you for your help.  I confirm that guidance regarding antiplatelet and oral anticoagulation therapy has been completed and, if necessary, noted below. Per PharmD: Per office protocol, patient can hold Eliquis for 3 days prior to procedure.   I also confirmed the patient resides in the state of [place]. As per [facility] Medical Board telemedicine laws, the patient must reside in the state in which the provider is licensed. - [place]   [place], PA-C 06/01/2023, 8:16 AM [facility]

## 2023-06-01 NOTE — Telephone Encounter (Signed)
  Patient Consent for Virtual Visit        Steven Gray [facility] has provided verbal consent on 06/01/2023 for a virtual visit (video or telephone).   CONSENT FOR VIRTUAL VISIT FOR:  Steven Gray  By participating in this virtual visit I agree to the following:  I hereby voluntarily request, consent and authorize [facility] and its employed or contracted physicians, physician assistants, nurse practitioners or other licensed health care professionals (the Practitioner), to provide me with telemedicine health care services (the "Services") as deemed necessary by the treating Practitioner. I acknowledge and consent to receive the Services by the Practitioner via telemedicine. I understand that the telemedicine visit will involve communicating with the Practitioner through live audiovisual communication technology and the disclosure of certain medical information by electronic transmission. I acknowledge that I have been given the opportunity to request an in-person assessment or other available alternative prior to the telemedicine visit and am voluntarily participating in the telemedicine visit.  I understand that I have the right to withhold or withdraw my consent to the use of telemedicine in the course of my care at any time, without affecting my right to future care or treatment, and that the Practitioner or I may terminate the telemedicine visit at any time. I understand that I have the right to inspect all information obtained and/or recorded in the course of the telemedicine visit and may receive copies of available information for a reasonable fee.  I understand that some of the potential risks of receiving the Services via telemedicine include:  Delay or interruption in medical evaluation due to technological equipment failure or disruption; Information transmitted may not be sufficient (e.g. poor resolution of images) to allow for appropriate medical decision making by the  Practitioner; and/or  In rare instances, security protocols could fail, causing a breach of personal health information.  Furthermore, I acknowledge that it is my responsibility to provide information about my medical history, conditions and care that is complete and accurate to the best of my ability. I acknowledge that Practitioner's advice, recommendations, and/or decision may be based on factors not within their control, such as incomplete or inaccurate data provided by me or distortions of diagnostic images or specimens that may result from electronic transmissions. I understand that the practice of medicine is not an exact science and that Practitioner makes no warranties or guarantees regarding treatment outcomes. I acknowledge that a copy of this consent can be made available to me via my patient portal [facility] MyChart), or I can request a printed copy by calling the office of [facility].    I understand that my insurance will be billed for this visit.   I have read or had this consent read to me. I understand the contents of this consent, which adequately explains the benefits and risks of the Services being provided via telemedicine.  I have been provided ample opportunity to ask questions regarding this consent and the Services and have had my questions answered to my satisfaction. I give my informed consent for the services to be provided through the use of telemedicine in my medical care

## 2023-06-01 NOTE — Telephone Encounter (Signed)
Preop televisit now scheduled, med rec and consent done.

## 2023-07-07 ENCOUNTER — Ambulatory Visit: Payer: Medicare Other | Attending: Nurse Practitioner

## 2023-07-07 DIAGNOSIS — Z01810 Encounter for preprocedural cardiovascular examination: Secondary | ICD-10-CM | POA: Insufficient documentation

## 2023-07-07 NOTE — Progress Notes (Signed)
 Virtual Visit via Telephone Note   Because of Steven Gray co-morbid illnesses, he is at least at moderate risk for complications without adequate follow up.  This format is felt to be most appropriate for this patient at this time.  Due to technical limitations with video connection Web designer), today's appointment will be conducted as an audio only telehealth visit, and Steven Gray verbally agreed to proceed in this manner.   All issues noted in this document were discussed and addressed.  No physical exam could be performed with this format.  Evaluation Performed:  Preoperative cardiovascular risk assessment _____________   Date:  07/07/2023   Patient ID:  Steven Gray, DOB [birth year], MRN [id number] Patient Location:  Home Provider location:   Office  Primary Care Provider:  Geoffry Paradise, MD Primary Cardiologist:  None  Chief Complaint / Patient Profile   73 y.o. y/o male with a h/o persistent AF (on Eliquis), OSA (on CPAP), HFrEF, NICM, HLD who is pending prostate biopsy and presents today for telephonic preoperative cardiovascular risk assessment.  History of Present Illness    Steven Gray is a 73 y.o. male who presents via audio/video conferencing for a telehealth visit today.  Pt was last seen in cardiology clinic on 03/31/2023 by Dr. Graciela Husbands.  At that time Steven Gray was doing well with no complaints of chest pain.  He endorsed a few episodes of AF that responded well to flecainide.  The patient is now pending procedure as outlined above. Since his last visit, he reports doing well with no new cardiac complaints.  He had recently gone snow skiing and had no difficulty with his activity.  He reports 1 episode of A-fib that lasted about an hour but resolved with flecainide and no symptomatic concerns.  He denies chest pain, shortness of breath, lower extremity edema, fatigue, palpitations, melena, hematuria, hemoptysis, diaphoresis, weakness,  presyncope, syncope, orthopnea, and PND.    Past Medical History    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   Persistent atrial fibrillation (HCC) 06/03/2019   Right knee pain 12/07/2016   Sleep apnea    wears CPAP   Past Surgical History:  Procedure Laterality Date   ATRIAL FIBRILLATION ABLATION N/A 05/14/2020   Procedure: ATRIAL FIBRILLATION ABLATION;  Surgeon: Hillis Range, MD;  Location: [facility] LAB;  Service: Cardiovascular;  Laterality: N/A;   ATRIAL FIBRILLATION ABLATION N/A 12/03/2020   Procedure: ATRIAL FIBRILLATION ABLATION;  Surgeon: Hillis Range, MD;  Location: [facility] LAB;  Service: Cardiovascular;  Laterality: N/A;   CARDIOVERSION N/A 07/04/2019   Procedure: CARDIOVERSION;  Surgeon: Quintella Reichert, MD;  Location: [facility] ENDOSCOPY;  Service: Cardiovascular;  Laterality: N/A;   CARDIOVERSION N/A 11/30/2019   Procedure: CARDIOVERSION;  Surgeon: Lewayne Bunting, MD;  Location: [facility] ENDOSCOPY;  Service: Cardiovascular;  Laterality: N/A;   CARDIOVERSION N/A 04/02/2020   Procedure: CARDIOVERSION;  Surgeon: Chilton Si, MD;  Location: [facility] ENDOSCOPY;  Service: Cardiovascular;  Laterality: N/A;   CARDIOVERSION N/A 10/02/2020   Procedure: CARDIOVERSION;  Surgeon: Antonieta Iba, MD;  Location: [facility] ORS;  Service: Cardiovascular;  Laterality: N/A;   CARDIOVERSION N/A 11/06/2020  Procedure: CARDIOVERSION;  Surgeon: [name], MD;  Location: [facility] ENDOSCOPY;  Service: Cardiovascular;  Laterality: N/A;   CARDIOVERSION N/A 09/18/2021   Procedure: CARDIOVERSION;  Surgeon: Lewayne Bunting, MD;  Location: [facility] ENDOSCOPY;  Service: Cardiovascular;  Laterality:  N/A;   CARDIOVERSION N/A 06/12/2022   Procedure: CARDIOVERSION;  Surgeon: Jodelle Red, MD;  Location: [facility] ENDOSCOPY;  Service: Cardiovascular;  Laterality: N/A;   MENISCUS REPAIR     TEE WITHOUT CARDIOVERSION N/A 06/05/2019   Procedure: TRANSESOPHAGEAL ECHOCARDIOGRAM (TEE);  Surgeon: Wendall Stade, MD;  Location: [facility] ENDOSCOPY;  Service: Cardiovascular;  Laterality: N/A;   TEE WITHOUT CARDIOVERSION N/A 07/04/2019   Procedure: TRANSESOPHAGEAL ECHOCARDIOGRAM (TEE);  Surgeon: Quintella Reichert, MD;  Location: [facility] ENDOSCOPY;  Service: Cardiovascular;  Laterality: N/A;    Allergies  No Known Allergies  Home Medications    Prior to Admission medications   Medication Sig Start Date End Date Taking? Authorizing Provider  apixaban (ELIQUIS) 5 MG TABS tablet TAKE 1 TABLET(5 MG) BY MOUTH TWICE DAILY 11/26/22   Duke Salvia, MD  atorvastatin (LIPITOR) 40 MG tablet TAKE 1 TABLET(40 MG) BY MOUTH DAILY AT 6 PM 09/08/22   Duke Salvia, MD  diltiazem (CARDIZEM CD) 120 MG 24 hr capsule Take 1 capsule (120 mg total) by mouth daily. Patient taking differently: Take 120 mg by mouth as needed. 07/10/22   Duke Salvia, MD  flecainide (TAMBOCOR) 150 MG tablet Take 2 tablets (300mg ) for Afib episode. 08/14/22   Duke Salvia, MD  losartan (COZAAR) 25 MG tablet TAKE 1 TABLET(25 MG) BY MOUTH DAILY 05/29/22   Duke Salvia, MD  MAGNESIUM PO Take 1 tablet by mouth daily.    [provider]  ORAL ELECTROLYTES PO Take 1 Scoop by mouth daily. Electrolyte multivitamin powder    [provider]  zolpidem (AMBIEN) 10 MG tablet Take 2.5-5 mg by mouth at bedtime as needed for sleep. 05/06/20   [provider]    Physical Exam    Vital Signs:  Steven Gray does not have vital signs available for review today.  Given telephonic nature of communication, physical exam is limited. AAOx3. NAD. Normal affect.  Speech and respirations are unlabored.  Accessory Clinical Findings     None  Assessment & Plan    1.  Preoperative Cardiovascular Risk Assessment: -Patient's RCRI score is 11%  The patient affirms he has been doing well without any new cardiac symptoms. They are able to achieve 7 METS without cardiac limitations. Therefore, based on ACC/AHA guidelines, the patient would be at acceptable risk for the planned procedure without further cardiovascular testing. The patient was advised that if he develops new symptoms prior to surgery to contact our office to arrange for a follow-up visit, and he verbalized understanding.   The patient was advised that if he develops new symptoms prior to surgery to contact our office to arrange for a follow-up visit, and he verbalized understanding.  Patient can hold Eliquis 3 days prior to procedure  A copy of this note will be routed to requesting surgeon.  Time:   Today, I have spent 7 minutes with the patient with telehealth technology discussing medical history, symptoms, and management plan.     Napoleon Form, Leodis Rains, NP  07/07/2023, 7:05 AM

## 2023-08-25 ENCOUNTER — Other Ambulatory Visit: Payer: Self-pay | Admitting: Internal Medicine

## 2023-09-08 ENCOUNTER — Other Ambulatory Visit: Payer: Self-pay

## 2023-09-08 MED ORDER — DILTIAZEM HCL ER COATED BEADS 120 MG PO CP24: 120.0000 mg | ORAL_CAPSULE | Freq: Every day | ORAL | 6 refills | Status: DC

## 2023-09-17 ENCOUNTER — Other Ambulatory Visit (HOSPITAL_COMMUNITY): Payer: Self-pay | Admitting: Optometry

## 2023-09-17 ENCOUNTER — Ambulatory Visit (HOSPITAL_COMMUNITY)
Admission: RE | Admit: 2023-09-17 | Discharge: 2023-09-17 | Disposition: A | Discharge status: Home / Self Care | Source: Ambulatory Visit | Attending: Vascular Surgery | Admitting: Vascular Surgery

## 2023-09-17 DIAGNOSIS — G453 Amaurosis fugax: Secondary | ICD-10-CM | POA: Diagnosis not present

## 2023-12-03 ENCOUNTER — Other Ambulatory Visit: Payer: Self-pay | Admitting: Internal Medicine

## 2023-12-03 DIAGNOSIS — I4819 Other persistent atrial fibrillation: Secondary | ICD-10-CM

## 2023-12-03 NOTE — Telephone Encounter (Signed)
 Eliquis  5mg  refill request received. Patient is 73 years old, weight-85.7kg, Crea-1.70 on 10/26/23 via [facility] Every[facility] form [facility]-Dr. Shepard, Diagnosis-Afib, and last seen by Dr. Fernande on 03/31/23. Dose is appropriate based on dosing criteria. Will send in refill to requested pharmacy.

## 2023-12-30 ENCOUNTER — Ambulatory Visit: Admitting: Cardiovascular Disease

## 2024-01-25 NOTE — Progress Notes (Unsigned)
  Electrophysiology Office Note:    Date:  01/25/2024   ID:  Steven Gray, DOB 20-Sep-1950, MRN [id number]  PCP:  Shepard Ade, MD   [facility] Providers Cardiologist:  None     Referring MD: Shepard Ade, MD   History of Present Illness:    Steven Gray is a 73 y.o. male with a hx listed below, significant for persistent AF, CHFrEF, OSA, referred for arrhythmia management.  He was diagnosed with AF in January, 2021 associated with CHF symptoms. 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 Cardioversion was deferred initially due to dense smoke in the LAA. He eventually was cardioverted in March. Imaging showed normalization of EF, but atria were severely enlarged.  He had an ablation by Dr. Kelsie in 05/14/20 but AF recurred and persisted.. Repeat ablation was performed on 12/03/20.  At that time, his pulmonary veins were largely silent.  There is only 1 small area of breakthrough.  Posterior wall ablation was performed.  He presented to AF clinic in February with recurrence of AF. He underwent DCCV on 06/12/2022.   He was started on pil-in-the-pocket flecainide  by Dr. Fernande.  He has been having episodes of atrial fibrillation, perhaps once a month, that have responded very well to call in the pocket flecainide .  Tells me he takes 3 tablets --I am not certain whether he is taking 3 tablets of flecainide  or 2 tablets of flecainide  into tablet of diltiazem .       EKGs/Labs/Other Studies Reviewed Today:    Echocardiogram:    Monitors:    Advanced imaging:   EKG:  Last EKG results: today - sinus rhythm, mildly bradycardic   Recent Labs: 03/31/2023: Hemoglobin 14.7; Platelets 180     Physical Exam:    VS:  There were no vitals taken for this visit.    Wt Readings from Last 3 Encounters:  03/31/23 189 lb (85.7 kg)  08/20/22 185 lb (83.9 kg)  08/14/22 193 lb 9.6 oz (87.8 kg)     GEN: Well  nourished, well developed in no acute distress CARDIAC: RRR, no murmurs, rubs, gallops RESPIRATORY:  Normal work of breathing MUSCULOSKELETAL: no edema    ASSESSMENT & PLAN:    Atrial fibrillation -- persistent - S/p ablation by Dr. Kelsie x2 --veins are very likely blocked.  A posterior wall ablation and reisolation of a single vein was performed during the second procedure.  -Now utilizing a pill in the pocket approach with flecainide . - I stressed that he should take only 2 tablets of flecainide  at a time, and I advised him not to take a second dose for several days after the first dose. - If episodes increase, I would recommend placing a monitor to see if he is having fibrillation or an atypical flutter, which would be a target for ablation  Atypical atrial flutter - often a difficult target for ablation  Secondary hypercoagulable state due to AF - Chads2-Vasc is 2 - continue eliquis         Medication Adjustments/Labs and Tests Ordered: Current medicines are reviewed at length with the patient today.  Concerns regarding medicines are outlined above.  No orders of the defined types were placed in this encounter.  No orders of the defined types were placed in this encounter.    Signed, Eulas FORBES Furbish, MD  01/25/2024 7:57 PM    [facility]

## 2024-01-26 ENCOUNTER — Ambulatory Visit: Attending: Cardiology | Admitting: Cardiovascular Disease

## 2024-01-26 ENCOUNTER — Encounter: Payer: Self-pay | Admitting: Cardiovascular Disease

## 2024-01-26 VITALS — BP 118/64 | HR 67 | Ht 73.0 in | Wt 190.0 lb

## 2024-01-26 DIAGNOSIS — I4819 Other persistent atrial fibrillation: Secondary | ICD-10-CM | POA: Insufficient documentation

## 2024-01-26 DIAGNOSIS — I484 Atypical atrial flutter: Secondary | ICD-10-CM | POA: Diagnosis not present

## 2024-01-26 DIAGNOSIS — I428 Other cardiomyopathies: Secondary | ICD-10-CM | POA: Diagnosis present

## 2024-01-26 NOTE — Patient Instructions (Signed)

## 2024-03-07 ENCOUNTER — Telehealth: Payer: Self-pay | Admitting: Cardiovascular Disease

## 2024-03-07 DIAGNOSIS — I4819 Other persistent atrial fibrillation: Secondary | ICD-10-CM

## 2024-03-07 MED ORDER — APIXABAN 5 MG PO TABS: 5.0000 mg | ORAL_TABLET | Freq: Two times a day (BID) | ORAL | 1 refills | Status: AC

## 2024-03-07 NOTE — Telephone Encounter (Signed)
*  STAT* If patient is at the pharmacy, call can be transferred to refill team.   1. Which medications need to be refilled? (please list name of each medication and dose if known)   ELIQUIS  5 MG TABS tablet    2. Which pharmacy/location (including street and city if local pharmacy) is medication to be sent to?  [id number] - [place], [place] - [address] AT [facility] OF [address]      3. Do they need a 30 day or 90 day supply? 90 day

## 2024-03-07 NOTE — Telephone Encounter (Signed)
 Pt last saw Dr Nancey 01/26/24, last labs 10/26/23 via [facility] Dr Shepard Creat 1.70, age 73, weight 86.2kg, based on specified criteria pt is on appropriate dosage of Eliquis  5mg  BID for afib.  Will refill rx.

## 2024-04-21 ENCOUNTER — Other Ambulatory Visit (HOSPITAL_COMMUNITY): Payer: Self-pay | Admitting: *Deleted

## 2024-04-21 MED ORDER — DILTIAZEM HCL ER COATED BEADS 120 MG PO CP24: 120.0000 mg | ORAL_CAPSULE | Freq: Every day | ORAL | 2 refills | Status: AC | PRN

## 2024-04-21 MED ORDER — FLECAINIDE ACETATE 150 MG PO TABS: ORAL_TABLET | ORAL | 0 refills | Status: AC

## 2024-07-25 ENCOUNTER — Ambulatory Visit (HOSPITAL_COMMUNITY): Admitting: Physician Assistant
